# Patient Record
Sex: Female | Born: 1975 | Race: White | Hispanic: No | Smoking: Never smoker
Health system: Southern US, Academic
[De-identification: ages and names within clinical notes are randomized; demographics above are authoritative.]

## PROBLEM LIST (undated history)

## (undated) DIAGNOSIS — M25561 Pain in right knee: Secondary | ICD-10-CM

## (undated) DIAGNOSIS — G43909 Migraine, unspecified, not intractable, without status migrainosus: Secondary | ICD-10-CM

## (undated) DIAGNOSIS — G4700 Insomnia, unspecified: Secondary | ICD-10-CM

## (undated) DIAGNOSIS — K219 Gastro-esophageal reflux disease without esophagitis: Secondary | ICD-10-CM

## (undated) DIAGNOSIS — K3184 Gastroparesis: Secondary | ICD-10-CM

## (undated) DIAGNOSIS — R7303 Prediabetes: Secondary | ICD-10-CM

## (undated) DIAGNOSIS — M62830 Muscle spasm of back: Secondary | ICD-10-CM

## (undated) DIAGNOSIS — G8929 Other chronic pain: Secondary | ICD-10-CM

## (undated) DIAGNOSIS — E559 Vitamin D deficiency, unspecified: Secondary | ICD-10-CM

## (undated) DIAGNOSIS — R42 Dizziness and giddiness: Secondary | ICD-10-CM

## (undated) DIAGNOSIS — Z6835 Body mass index (BMI) 35.0-35.9, adult: Secondary | ICD-10-CM

## (undated) DIAGNOSIS — N938 Other specified abnormal uterine and vaginal bleeding: Secondary | ICD-10-CM

## (undated) DIAGNOSIS — M545 Low back pain, unspecified: Secondary | ICD-10-CM

## (undated) DIAGNOSIS — M25562 Pain in left knee: Secondary | ICD-10-CM

## (undated) DIAGNOSIS — R739 Hyperglycemia, unspecified: Secondary | ICD-10-CM

## (undated) DIAGNOSIS — Z9889 Other specified postprocedural states: Secondary | ICD-10-CM

## (undated) DIAGNOSIS — E785 Hyperlipidemia, unspecified: Secondary | ICD-10-CM

## (undated) HISTORY — DX: Body mass index (BMI) 35.0-35.9, adult: Z68.35

## (undated) HISTORY — DX: Low back pain, unspecified: M54.50

## (undated) HISTORY — DX: Vitamin D deficiency, unspecified: E55.9

## (undated) HISTORY — DX: Gastroparesis: K31.84

## (undated) HISTORY — DX: Hyperglycemia, unspecified: R73.9

## (undated) HISTORY — DX: Dizziness and giddiness: R42

## (undated) HISTORY — PX: ENDOMETRIAL ABLATION W/ NOVASURE: SUR434

## (undated) HISTORY — DX: Pain in left knee: M25.562

## (undated) HISTORY — PX: ESOPHAGOSCOPY / EGD: SUR461

## (undated) HISTORY — DX: Prediabetes: R73.03

## (undated) HISTORY — DX: Hyperlipidemia, unspecified: E78.5

## (undated) HISTORY — PX: COLONOSCOPY: WVUENDOPRO10

## (undated) HISTORY — PX: HX LAP CHOLECYSTECTOMY: SHX56

## (undated) HISTORY — DX: Insomnia, unspecified: G47.00

## (undated) HISTORY — DX: Gastro-esophageal reflux disease without esophagitis: K21.9

## (undated) HISTORY — DX: Other specified abnormal uterine and vaginal bleeding: N93.8

## (undated) HISTORY — DX: Pain in right knee: M25.561

## (undated) HISTORY — DX: Migraine, unspecified, not intractable, without status migrainosus: G43.909

## (undated) HISTORY — PX: HX BACK SURGERY: SHX140

## (undated) HISTORY — DX: Muscle spasm of back: M62.830

## (undated) HISTORY — DX: Other specified postprocedural states: Z98.890

## (undated) HISTORY — DX: Other chronic pain: G89.29

## (undated) HISTORY — PX: HX APPENDECTOMY: SHX54

## (undated) NOTE — Unmapped External Note (Signed)
Formatting of this note might be different from the original.  Images from the original note were not included.      Respiratory Protocol                 Ventilator Management             Electronically signed by Ledford, Logan L, CRT at 08/13/2022  1:28 PM EDT

## (undated) NOTE — Consults (Signed)
Formatting of this note might be different from the original.  RT Consult/Eval  +Pulmonary Status : No smoking  +Respiratory Pattern/Rate : Reg/RR 12-20  +Breath Sounds : Clear to auscu  +Cough : Strong/non-productive  +Surgical Status: No surgery  +Mental Status: Alert/Oriented  +Activity Level: Ambulatory  +Chest X-Ray: Clear/None Available  Total Points: 0  Patient Classification: Triage 5  Next Eval Due: x1  Home Therapy Level: none per pt    Evaluation Interventions: Lung Hyperinflation Therapy (no rt needed at this time)    Electronically signed by Ledford, Logan L, CRT at 08/13/2022  1:28 PM EDT

## (undated) NOTE — Unmapped External Note (Signed)
Formatting of this note might be different from the original.    Problem: Pain  Goal: Patient's pain/discomfort is manageable  Description: Assess and monitor patient's pain using appropriate pain scale. Collaborate with interdisciplinary team and initiate plan and interventions as ordered. Re-assess patient's pain level 30 - 60 minutes after pain management intervention.   Outcome: Progressing    Problem: Safety  Goal: Patient will be injury free during hospitalization  Description: Assess and monitor vitals signs, neurological status including level of consciousness and orientation. Assess patient's risk for falls and implement fall prevention plan of care and interventions per hospital policy.     Ensure arm band on, uncluttered walking paths in room, adequate room lighting, call light and overbed table within reach, bed in low position, wheels locked, side rails up per policy, and non-skid footwear provided.   Outcome: Progressing    Problem: Daily Care  Goal: Daily care needs are met  Description: Assess and monitor ability to perform self care and identify potential discharge needs.  Outcome: Progressing    Problem: Psychosocial Needs  Goal: Demonstrates ability to cope with hospitalization/illness  Description: Assess and monitor patients ability to cope with his/her illness.  Outcome: Progressing    Electronically signed by Booher, Christina D, RN at 08/14/2022  4:43 PM EDT

## (undated) NOTE — Nursing Note (Signed)
Formatting of this note might be different from the original.  Bed holding, sig other at bs  Electronically signed by Jones, Miranda, RN at 08/13/2022 10:19 AM EDT

## (undated) NOTE — Progress Notes (Signed)
Formatting of this note might be different from the original.  08/14/22 (CM note update) DC Orders reviewed. No needs identified. Family at bedside to transport home. Ready for dc from CM.   Electronically signed by Young, Rebecca L, CM at 08/14/2022  4:45 PM EDT

## (undated) NOTE — Discharge Summary (Signed)
Formatting of this note is different from the original.    Physician Discharge Summary     Patient ID:  Name:Lindsay Crawford  Date: 08/14/2022    MRN#: [id number] DOB: 03/27/1976   Admission Date:08/13/2022 Age/Sex:45-year old female     Discharge Date:  08/14/2022    Admitting Physician:  Benjamin S Scharfstein, MD FACS     Discharge Physician:  Scharfstein, Benjamin S,*    Admission Diagnoses:  GERD (gastroesophageal reflux disease) [K21.9]    Discharge Diagnoses:  Problem List Items Addressed This Visit    None  Visit Diagnoses       S/P Nissen fundoplication (with gastrostomy tube placement) (HCC)    -  Primary    Relevant Medications    oxyCODONE (Roxicodone) 5 MG immediate release tablet    naloxone (Narcan) 4 MG/0.1ML nasal spray         Admission Condition:  stable    Discharged Condition:  stable    Indication for Admission:  GERD (gastroesophageal reflux disease) [K21.9]    Hospital Course:  Patient underwent elective Nissen fundoplication and was subsequently admitted for post-operative monitoring and recovery. Patient tolerated the procedure well and progressed as expected. POD 1 she underwent a swallow study which was negative for leak. Her nasogastric tube was removed and she was started on a liquid diet. Later that evening she was tolerating her full liquid diet well, her pain was well controlled, she voided without difficulty, and ambulated without any issues.  Patient was seen by the surgical team and determined to be stable for discharge.    Consults:  None    Significant Diagnostic Studies:  Recent Results (from the past 7 days)    XR Abdomen 1 View    Narrative  EXAM DESCRIPTION:  XR ABDOMEN 1 VIEW KUB ON 08/13/2022    CLINICAL INFORMATION:  for NGT placement verification.    COMPARISON:  None available.    TECHNIQUE:  AP portable supine.    FINDINGS:  Esophagogastric catheter distal segment is adequately positioned inferior to left diaphragm.  Bowel gas pattern appears nonobstructive as seen.  The  majority of the pelvis is not imaged.  Osseous structures appear intact.  Cholecystectomy clips.    Impression  IMPRESSION:  ==========  Adequately positioned esophagogastric catheter segment inferior to left diaphragm.    Report dictation location: [id number]    Signed by: Eric J Fish, DO on 08/13/2022  8:50 AM    Recent Results (from the past 7 days)    FL DX Esophagram Single Contrast Water Soluble    Narrative  EXAM DESCRIPTION:  FL DX ESOPHAGRAM SINGLE CONTRAST WATER SOLUBLE ON 08/14/2022    INDICATION:  POst-op Nissen    COMPARISON:  None available.    TECHNIQUE:  Following the administration of Gastrografin orally, multiple fluoroscopic images were obtained in multiple different obliquities.  Approximately 1.48 of fluoroscopy time was utilized.  Sixty images.    With the assistance of Jerry Cohee RPA, multiple fluoroscopic imaging is obtained under direct supervision of a radiologist.    FINDINGS:  .  Limited evaluation of the esophagus was normal.  The upper esophageal mucosa was normal.  No strictures, masses, or areas of ulceration or seen.  There was some narrowing of the distal esophagus as expected associated with the recent Nissen fundoplication likely indicative of some underlying edema.  No evidence of a leak is seen.  Currently there is a nasogastric tube in place extending into the stomach.  The side hole   for the tube appears to reside near the gastroesophageal junction.    Impression  IMPRESSION:  No evidence of a leak.    Report dictation location: DESKTOP-[id number]    Signed by: Roderick F Biosca, MD on 08/14/2022  1:09 PM    Treatments:  Procedure(s):  DAVINCI ASSISTED LAPAROSCOPIC NISSEN FUNDOPLICATION    Orders Placed This Encounter   Medications    ceFAZolin (Ancef) 2 g in sodium chloride 100 mL IVPB-MBP    Biotin 10000 MCG Tablet Dispersible     Sig: Dissolve on tongue daily.    DISCONTD: sodium chloride (NS) 0.9 % flush 3 mL    DISCONTD: sodium chloride (NS) 0.9 % flush 3 mL    DISCONTD:  lactated ringers infusion    DISCONTD: midazolam (Versed) injection 1 mg    DISCONTD: fentaNYL (Sublimaze) injection 50 mcg    aprepitant (Emend) capsule 40 mg    chlorhexidine (Peridex) 0.12 % solution 15 mL    DISCONTD: insulin lispro (HumaLOG,AdmeLOG) injection 0-5 Units    DISCONTD: lactated ringers infusion    DISCONTD: atropine injection 0.4 mg     Order Specific Question:   For Heart Rate:     Answer:   < 40    DISCONTD: diphenhydrAMINE (Benadryl) injection 6.25 mg    DISCONTD: haloperidol lactate (Haldol) injection 1 mg    DISCONTD: HYDROmorphone (Dilaudid) injection 0.2 mg    DISCONTD: HYDROmorphone (Dilaudid) injection 0.5 mg    DISCONTD: naloxone (Narcan) injection 0.1 mg    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    diatrizoate meglumine-sodium (MD-Gastroview) 66-10 % solution 120 mL    oxyCODONE (Roxicodone) 5 MG immediate release tablet     Sig: Take 1 tablet (5 mg total) by mouth every 4 (four) hours as needed for pain for up to 3 days. Max Daily Amount: 30 mg     Dispense:  12 tablet     Refill:  0    naloxone (Narcan) 4 MG/0.1ML nasal spray     Sig: Instill 1 spray into alternating nostrils every 2 to 3 min as needed for respiratory depression. Follow package instructions for emergencies     Dispense:  2 each     Refill:  0     Disposition:  Home or Self Care    Patient Instructions:     Medication List       START taking these  medications      naloxone 4 MG/0.1ML nasal spray  Commonly known as: Narcan  Instill 1 spray into alternating nostrils every 2 to 3 min as needed for respiratory depression. Follow package instructions for emergencies    oxyCODONE 5 MG immediate release tablet  Commonly known as: Roxicodone  Take 1 tablet (5 mg total) by mouth every 4 (four) hours as needed for   pain for up to 3 days. Max Daily Amount: 30 mg          CONTINUE taking these medications      bethanechol 5 MG tablet  Commonly known as: Urecholine    Biotin 10000 MCG Tbdp    cimetidine 400 MG tablet  Commonly known as: Tagamet    cyclobenzaprine 10 MG tablet  Commonly known as: Flexeril    ferrous sulfate 325 (65 FE) MG tablet  Commonly known as: FeroSul    HYDROcodone-acetaminophen 5-325 MG per tablet  Commonly known as: Norco    LAXATIVE POLYETHYLENE GLYCOL PO    Linzess 72 MCG capsule  Generic drug: linaclotide    metoclopramide 5 MG tablet  Commonly known as: Reglan    omeprazole 20 MG capsule  Commonly known as: PriLOSEC    rosuvastatin 5 MG tablet  Commonly known as: Crestor    topiramate 100 MG tablet  Commonly known as: Topamax    traZODone 300 MG tablet  Commonly known as: Desyrel    vitamin B-12 1000 MCG tablet  Commonly known as: Cyanocobalamin    Vitamin C 500 MG Caps    Vitamin D (Ergocalciferol) 50000 units Caps    ZYRTEC ALLERGY PO            Where to Get Your Medications       These medications were sent to [id number] - [place], [place] - [address]  [address], [place] [address]      Phone: [phone]   naloxone 4 MG/0.1ML nasal spray  oxyCODONE 5 MG immediate release tablet      Discharge Procedure Orders   NALOXONE SPECIAL INSTRUCTIONS   Order Comments: NARCAN (naloxone hydrochloride) Nasal Spray is an opioid antagonist indicated for the emergency treatment of known or suspected opioid overdose, as manifested by respiratory and/or central nervous system depression. NARCAN Nasal Spray is intended for immediate  administration as emergency therapy in settings where opioids may be present.    Narcan Nasal spray is 4mg/dose intranasally.  Pharmacies may substitute a naloxone SQ (0.4mg/mL)  kit for patients who cannot afford naloxone nasal spray.  Patients are instructed to call 911 immediately if this medication is given.    Discussed the risks, benefits and alternatives to Narcan Nasal Spray.  Risks include cardiovascular events, precipitation of withdrawal, incomplete reversal, hypotension, ranging from mild and including, rarely, death.     Discharge Activity     Order Specific Question Answer Comments   Discharge Activity Discharge Activity as Tolerated    Driving Restrictions Patient May Not Drive Until: No longer requiring narcotics for pain control     Discharge Diet     Order Specific Question Answer Comments   Diet Other    Other: Full liquid diet      Special Instructions   Order Comments: Special Discharge Instructions:    Maintain full liquid diet for 2 weeks post-operatively until seen in clinic and cleared by your operative surgeon for solid foods. Avoid taking more than 2 tablets or pills at a time. Incisional wounds skin is closed with absorbable suture and will dissolve. Glue overtop will dissolve with time, do not scrub.     Signed:  Katherine D Kazen, MD, 08/14/2022 4:09 PM    Electronically signed by Scharfstein, Benjamin S, MD FACS at 08/15/2022  1:19 PM EDT

## (undated) NOTE — Unmapped External Note (Signed)
Formatting of this note might be different from the original.      [facility]                                                                                             Ludden, Lindsay Crawford                                                                                                                                     F   45 Y   [id number]  [address]                                                                                                           11/26/1976  [id number]  [place] [address]                                                                                                             BENJAMIN S SCHARFSTEIN                                                                                                                                       LOCATION:  [facility] PACU  PACU  [facility] PACU  OPERATIVE REPORT                                                                                                                   PATIENT TYPE:    PATIENT NAME:  [name], Lindsay Crawford    DATE OF OPERATION:  08/13/2022    SURGEON:  Ben S Scharfstein Jr, MD    REFERRING PHYSICIAN:  BENJAMIN S SCHARFSTEIN    PREOPERATIVE DIAGNOSIS:  Acid reflux refractory to medical management.    POSTOPERATIVE DIAGNOSIS:  Acid reflux refractory to medical management.    PROCEDURE:  Da Vinci-assisted laparoscopic Nissen fundoplication.    ANESTHESIA:  General endotracheal anesthesia.    ESTIMATED BLOOD LOSS:  Minimum.    INDICATIONS FOR PROCEDURE:  The patient is a 45-year-old female who had acid reflux which  was refractory to medical management.  She was referred for surgical management.  I spoke  with her the risks, benefits, and alternatives, and she opted to proceed.    PROCEDURE NOTE IN DETAIL:  The patient was taken to the OR suite.  After adequate  anesthesia was provided, her abdomen was prepped and draped in usual standard sterile  fashion.  Foley catheter was placed under sterile  condition as well.  Using the open  Hasson technique, a bladeless 8 mm trocar was placed above the umbilicus.  Pneumoperitoneum was provided and laparoscope was inserted.  Two bladeless trocars were  placed in the right upper quadrant, one 5 mm for the liver retractor and another 8 mm for  the da Vinci.  I then placed 3 bladeless trocars in the left upper quadrant, two 8 mm for  the da Vinci and one as an assist site.  The patient was placed in reverse Trendelenburg  position.  The liver retractor was carefully placed and elevated the left lobe of the  liver.  I began by taking down the gastrohepatic ligament.  I identified the right crus,  carefully freed up the GE junction from the left and right crus.  I divided the short  gastrics with Harmonic scalpel.  I then carefully gained access posteriorly to grasp the  fundus of the stomach, bringing it around posteriorly to create the wrap.  The wrap was  created with 3 interrupted 0 Ethibond stitches at the GE junction using 0 Ethibond for a  distance of about 2.5 to 3 cm.  The NG tube was left in place.  The liver retractor was  carefully removed, returned the liver to its anatomic position.  Pneumoperitoneum was  evacuated and the trocars were removed.  The fascia was reapproximated with 0 Vicryl.  The  skin was reapproximated with 4-0 Monocryl.  The sponge, needle, and instrument counts were  correct at the end of the case.  The patient tolerated the procedure well.  She was taken  to recovery room in stable condition.                                                 Ben S Scharfstein Jr, MD  Report is considered PRELIMINARY until authenticated.    BSS/AQuity  DD:  08/13/2022 08:24  DT:  08/13/2022 08:49  Document #:  [id number]    [name], Lindsay Crawford  OPERATIVE REPORT  [id number]  Electronically signed by Scharfstein, Benjamin S, MD FACS at 08/13/2022 11:28 AM EDT

## (undated) NOTE — Progress Notes (Signed)
Formatting of this note might be different from the original.  Images from the original note were not included.    Daily Progress Note    Subjective:  Patient is doing well postoperatively.  She complains of some abdominal soreness.  Denies any nausea or vomiting.  NG tube is in place and she would like this removed.  Has been passing gas in her sleep but denies bowel movement since admission.  Has been up and out of bed.    Objective:  Last Vitals  Temp: 97.8 F (36.6 C), Pulse: 84, BP: 129/63, Resp: 16, SpO2: 97 %    Vital Signs (Last 24)  Temp  Avg: 98.3 F (36.8 C)  Min: 97.8 F (36.6 C)  Max: 98.9 F (37.2 C)  Pulse  Avg: 81.8  Min: 70  Max: 88  BP  Min: 122/63  Max: 147/73  Resp  Avg: 15  Min: 11  Max: 20  SpO2  Avg: 96.7 %  Min: 94 %  Max: 100 %    Inputs / Outputs (Last 24 Hours):  09/15 0645 - 09/16 0644  In: 800 [I.V.:800]  Out: 320 [Urine:300]    Inputs / Outputs (Last Shift):  No intake/output data recorded.    General:  No acute distress.  NG tube in place  CV:  Regular rate and rhythm  Pulm:  Normal work of breathing on 2 L nasal cannula  Abdomen:  Soft with expected postoperative distention and soreness.  Minimal TTP to epigastric region. Incisions clean dry and intact.  No rebound or guarding  Extremities:  Warm and dry    Labs:  CBC:   No results for input(s): WBC, HGB, HCT, PLT in the last 72 hours.    CMP:  No results for input(s): NA, K, CHLORIDE, CO2, BUN, CREATININE, GLU, CALCIUM, CAION, MG, PHOS in the last 72 hours.    No results for input(s): BILITOT, ALKPHOS, AST, ALT, PROT, ALBUMIN, AMYLASE, LIPASE, LACTATE in the last 72 hours.    Coags:  No results for input(s): PROTIME, INR, PTT in the last 72 hours.    Imaging:  N/a    Assessment/Plan:  45-year old female hospital day#1, 1 Day Post-Op s/p DaVinci assisted laparoscopic Nissen fundoplication.  Patient is doing well postop.  Pain is well-controlled.  We will discuss advancing diet, swallow eval and NG tube removal with  attending.    Principal Problem:    GERD (gastroesophageal reflux disease)    --------------------------------------------------------  Will likely advance to clears today  Possible NG tube removal  Daily labs  Pain control  As needed antiemetics  Encourage out of bed  Follow up upper GI    Will discuss with attending surgeon with [facility].    Disposition:  Floor status    Electronically signed and authored by: Laura K Mann, DO - PGY 1, 08/14/2022, 5:56 AM.   Electronically signed by Scharfstein, Benjamin S, MD FACS at 08/14/2022  7:21 AM EDT

## (undated) NOTE — Progress Notes (Signed)
Formatting of this note is different from the original.  (CM note update)  DCP discussed w/pt to return home.  Independent of adls.  Denies HH/PT/DME services.  PCP Ladonna Bowling, MD at [facility].  SO to transport home.    08/13/22 1436   Outpatient/Observation High Risk Assessment   Outpatient/Observation Patient  No   Referral Data   Referral Source Case Management   Referral Reason Discharge planning   Patient Information   Obtained from Patient   Primary Caregiver Self   Support System Immediate family   Food Insecurity   Within the past 12 months, you worried that your food would run out before you got the money to buy more. Sometimes   Within the past 12 months, the food you bought just didn't last and you didn't have money to get more. Sometimes   HRSN - Transportation   In the past 12 months, has lack of reliable transportation kept you from medical appointments, meetings, work or from getting to things needed for daily living? No   HRSN - Living Situation   What is your living situation today? I have a ste   Think about the place you live. Do you have problems with any of the following? Water leaks   HRSN - Utilities   In the past 12 months has the electric, gas, oil, or water company threatened to shut off services in your home? Yes  (Water)   HRSN - Safety   How often does anyone, including family and friends, physically hurt you? Never   How often does anyone, including family and friends, insult or talk down to you? Never   How often does anyone, including family and friends, threaten you with harm? Never   How often does anyone, including family and friends, scream or curse at you? Never   HRSN - Safety Scoring 4   Activities of Daily Living   Functional Status Independent   Assistive Device N/A   Living Arrangement Lives with someone;Private home   Dressing, Feeding, & Bathing Independent   Disability N/A   Behavior Oriented   Communication Talks;Understands English   Referral To    Financial Resources N/A   Community Resources No   Other    CM Communication 08/13/22 Home; SO to transport home.       Electronically signed by Rose, Tangie, RN at 08/13/2022  2:40 PM EDT

---
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
## 2017-09-02 IMAGING — US ABD LIMITED
1 series · 14 of 25 positions shown · non-contrast
Comparison: Ultrasound abdomen dated 07/20/2016.

Exam:  MD ERSHADUL PROFESSIONAL READ ABD U/S LMTD
INDICATION: Fatty liver.

[Series 1: abd limited · 14 of 50 slices shown]
[im 1/50]
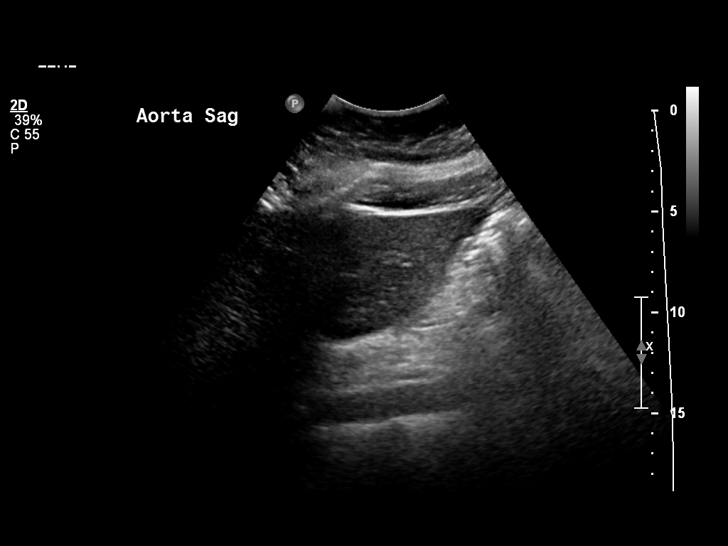
[im 5/50]
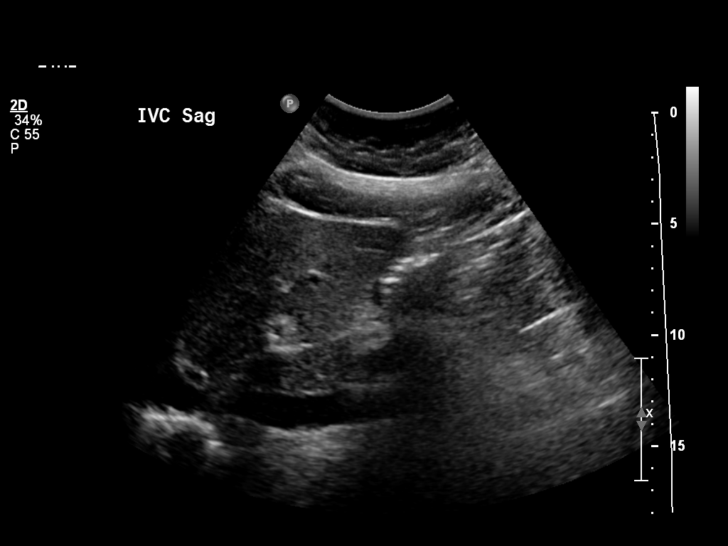
[im 9/50]
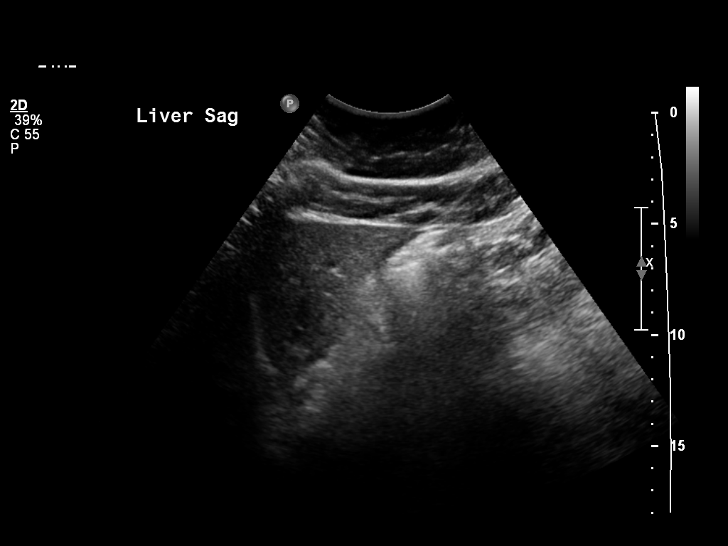
[im 13/50]
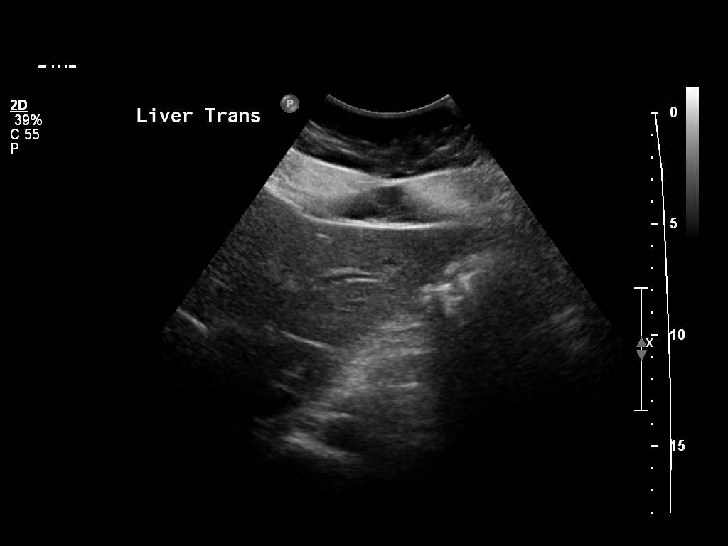
[im 17/50]
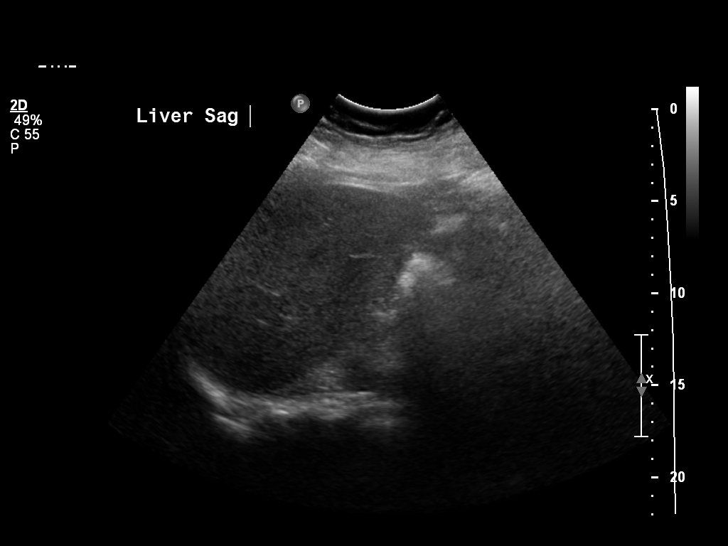
[im 19/50]
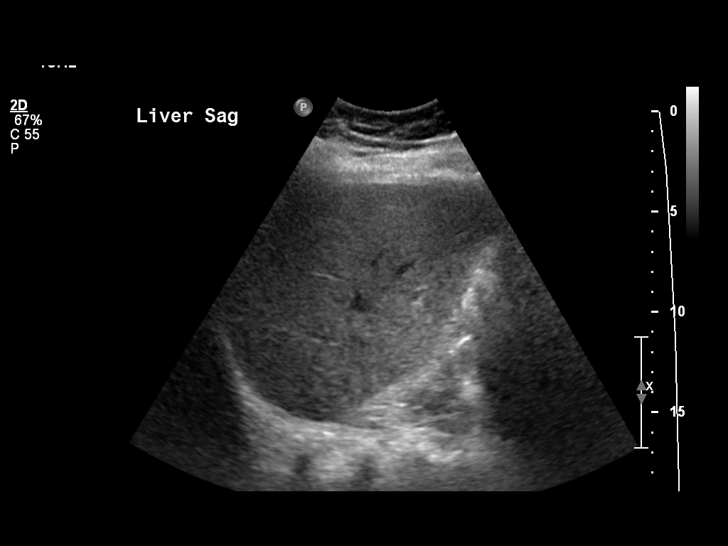
[im 23/50]
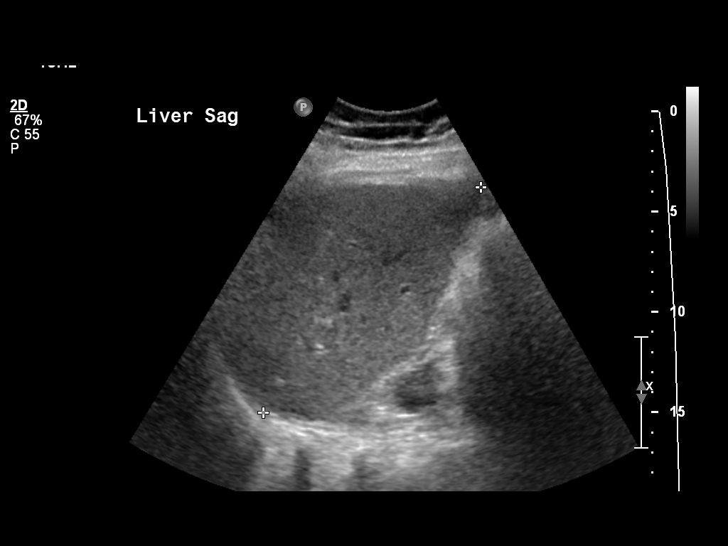
[im 27/50]
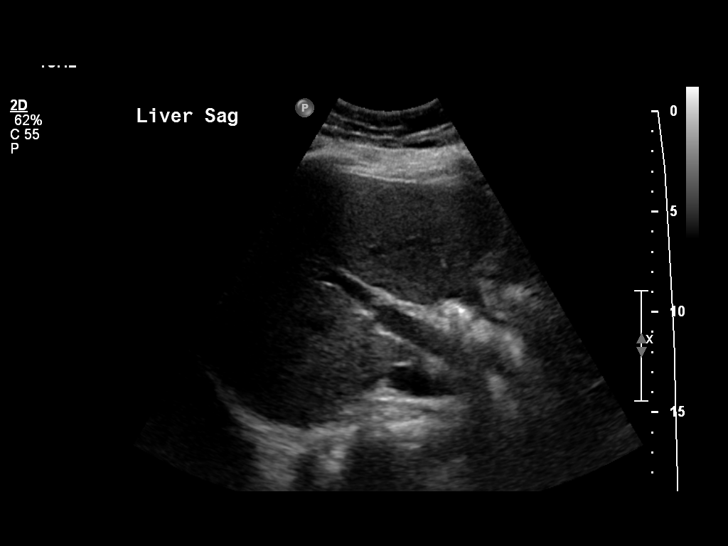
[im 31/50]
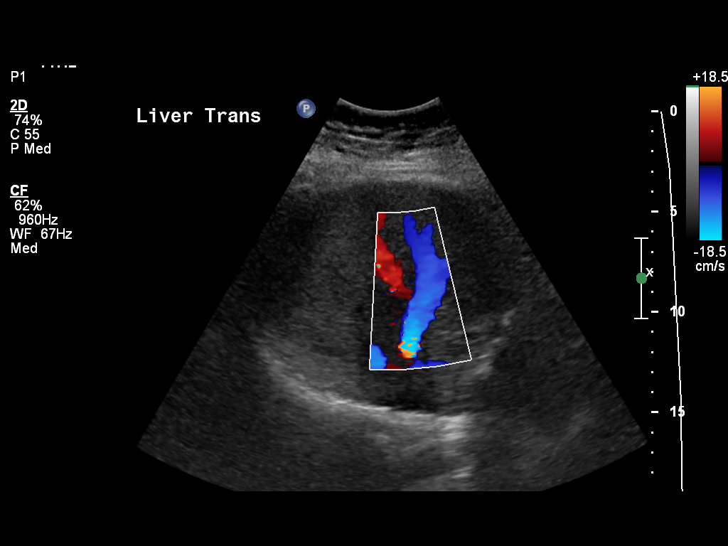
[im 33/50]
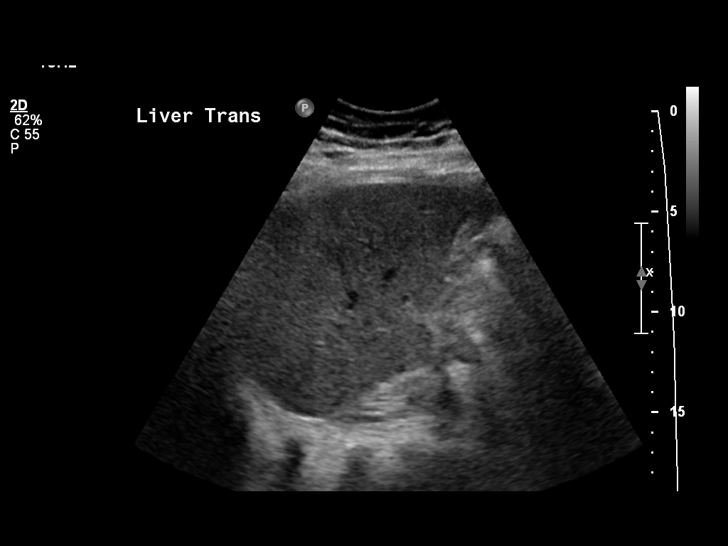
[im 37/50]
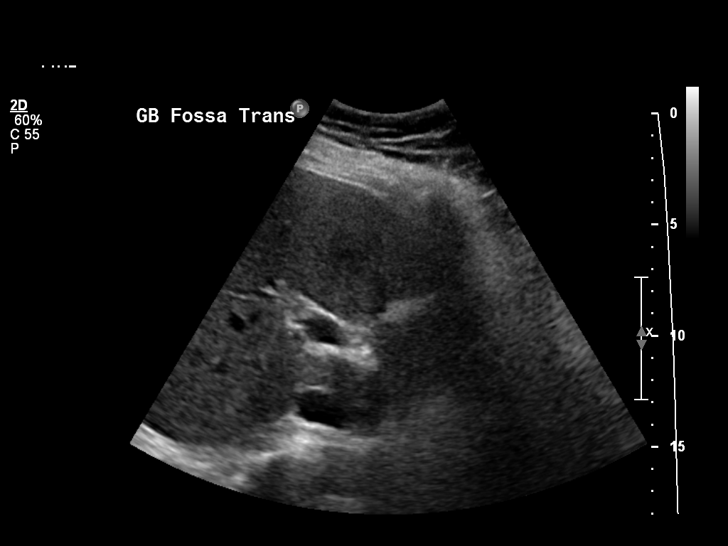
[im 41/50]
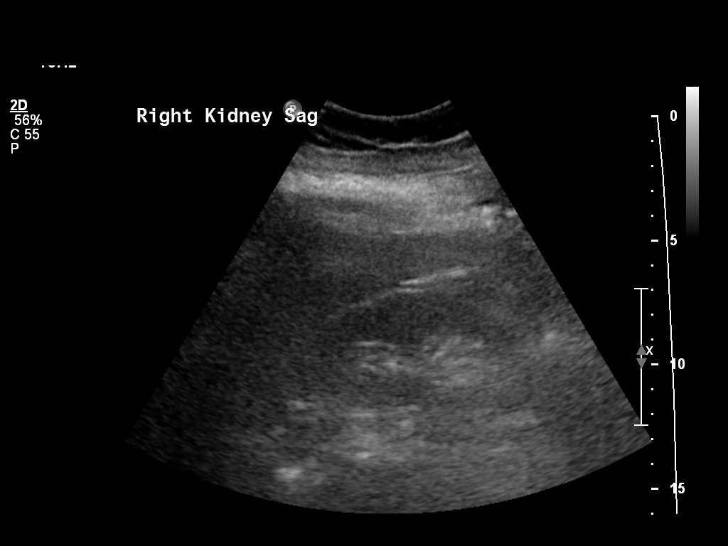
[im 45/50]
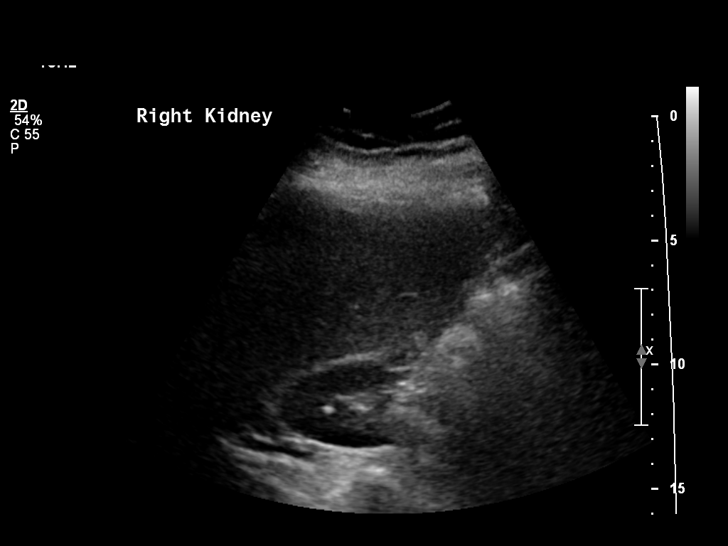
[im 50/50]
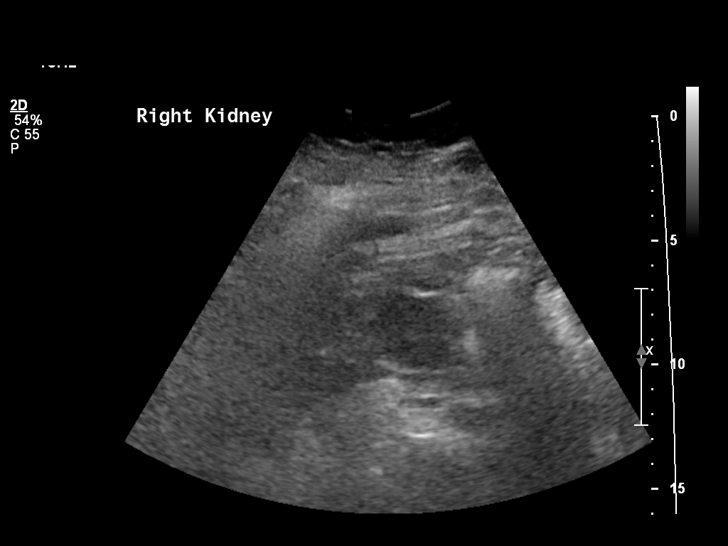

[14 of 25 positions shown; findings below may reference images not displayed]

FINDINGS: Images were loaded on the PACS system on 09/08/2017 at [DATE]. 

Liver is slightly heterogeneous in echogenicity. There is no definite hepatic mass. There is no intra or extra hepatic biliary ductal dilatation. Common bile duct measures 2.5 mm. Gallbladder is surgically absent. Pancreas is incompletely visualized due to artifact from overlying bowel gas. Right kidney measures 11 cm and is normal. 

Visualized abdominal aorta is without aneurysmal dilatation. IVC is normal. Portal vein measures 10 mm in diameter and demonstrates hepatopetal flow. Hepatic veins are also patent. There is no ascites.
IMPRESSION: Slightly heterogeneous echogenicity of the liver. 

Prior cholecystectomy. 

Pancreas incompletely visualized due to artifact from overlying bowel gas.

## 2018-10-17 DIAGNOSIS — M5126 Other intervertebral disc displacement, lumbar region: Secondary | ICD-10-CM | POA: Insufficient documentation

## 2021-01-21 IMAGING — CR XRAY KNEE 3 VIEWS LT
1 series · 3 of 3 positions shown · non-contrast
Comparison: Right knee radiographs dated 01/18/2017.

﻿EXAM:  XRAY KNEE 3 VIEWS LT,

EXAM  XRAY KNEE 3 VIEWS RT
INDICATION: Bilateral knee pain.

[Series 1: view not recorded · 0.17mm/px · 3 of 3 slices shown]
[im 1/3]
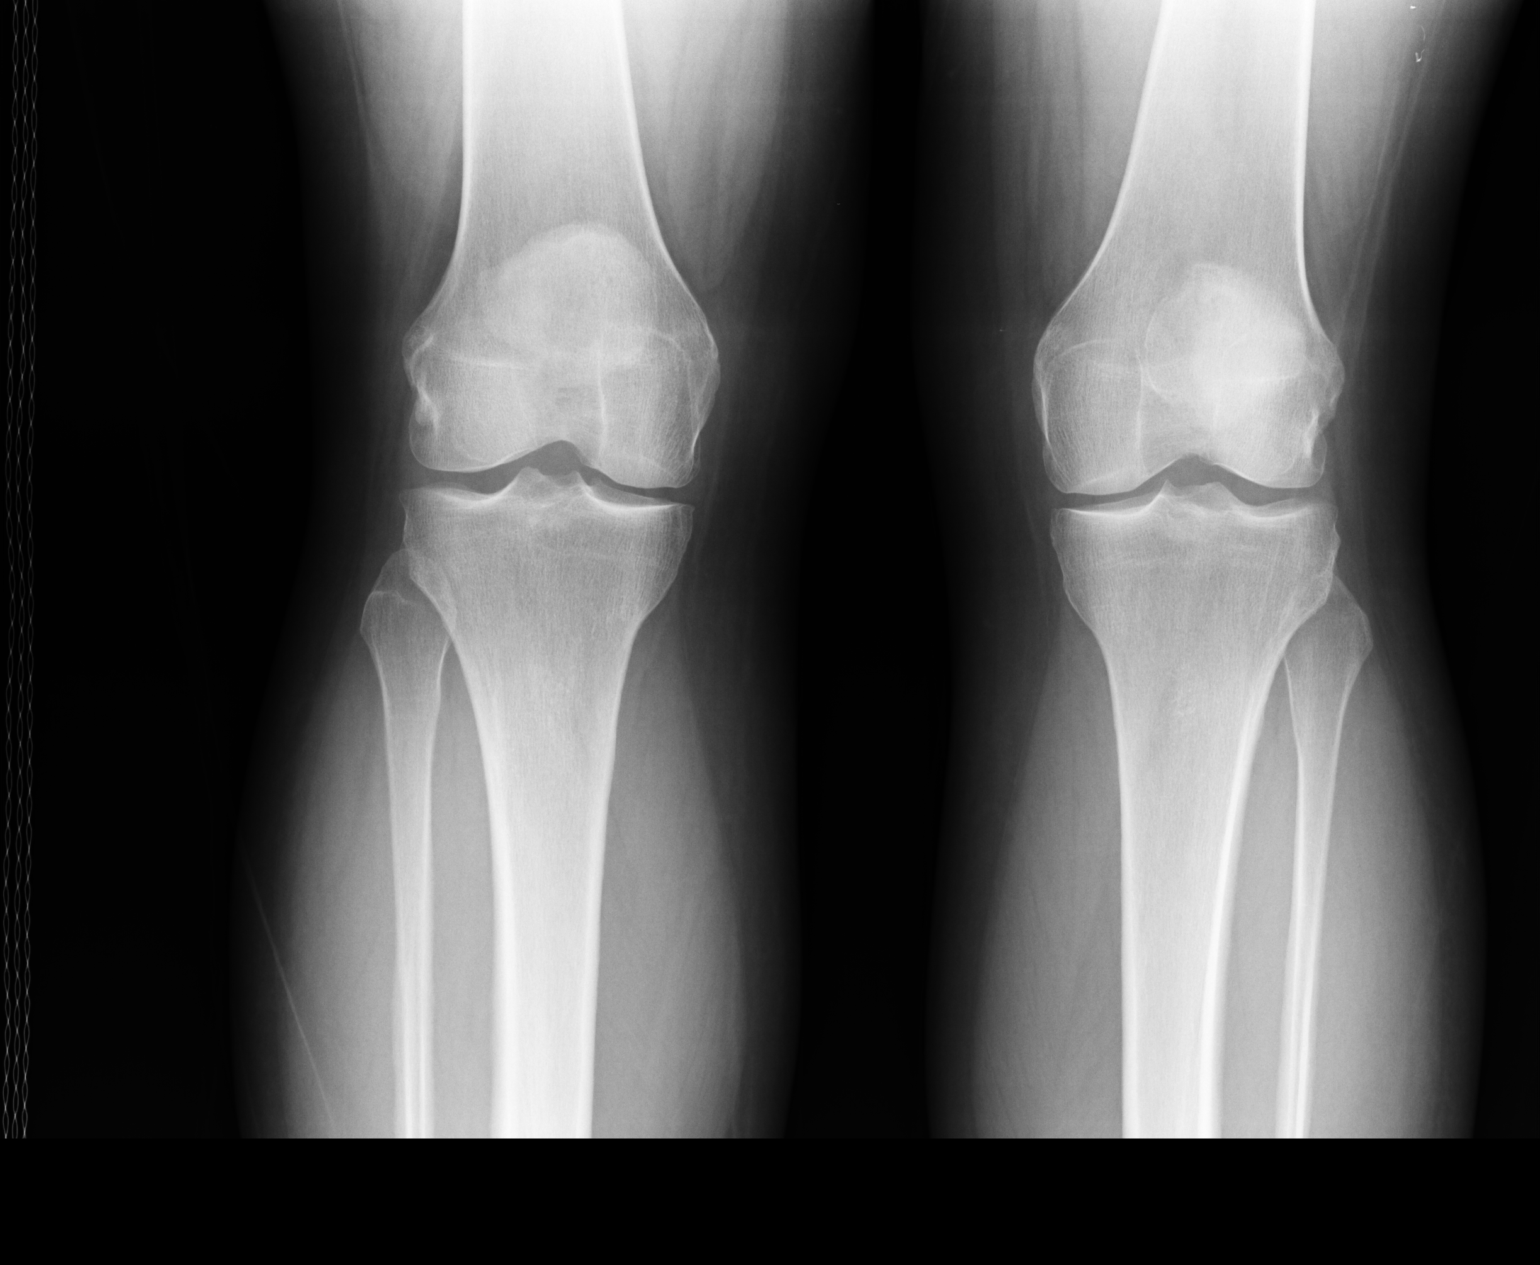
[im 2/3]
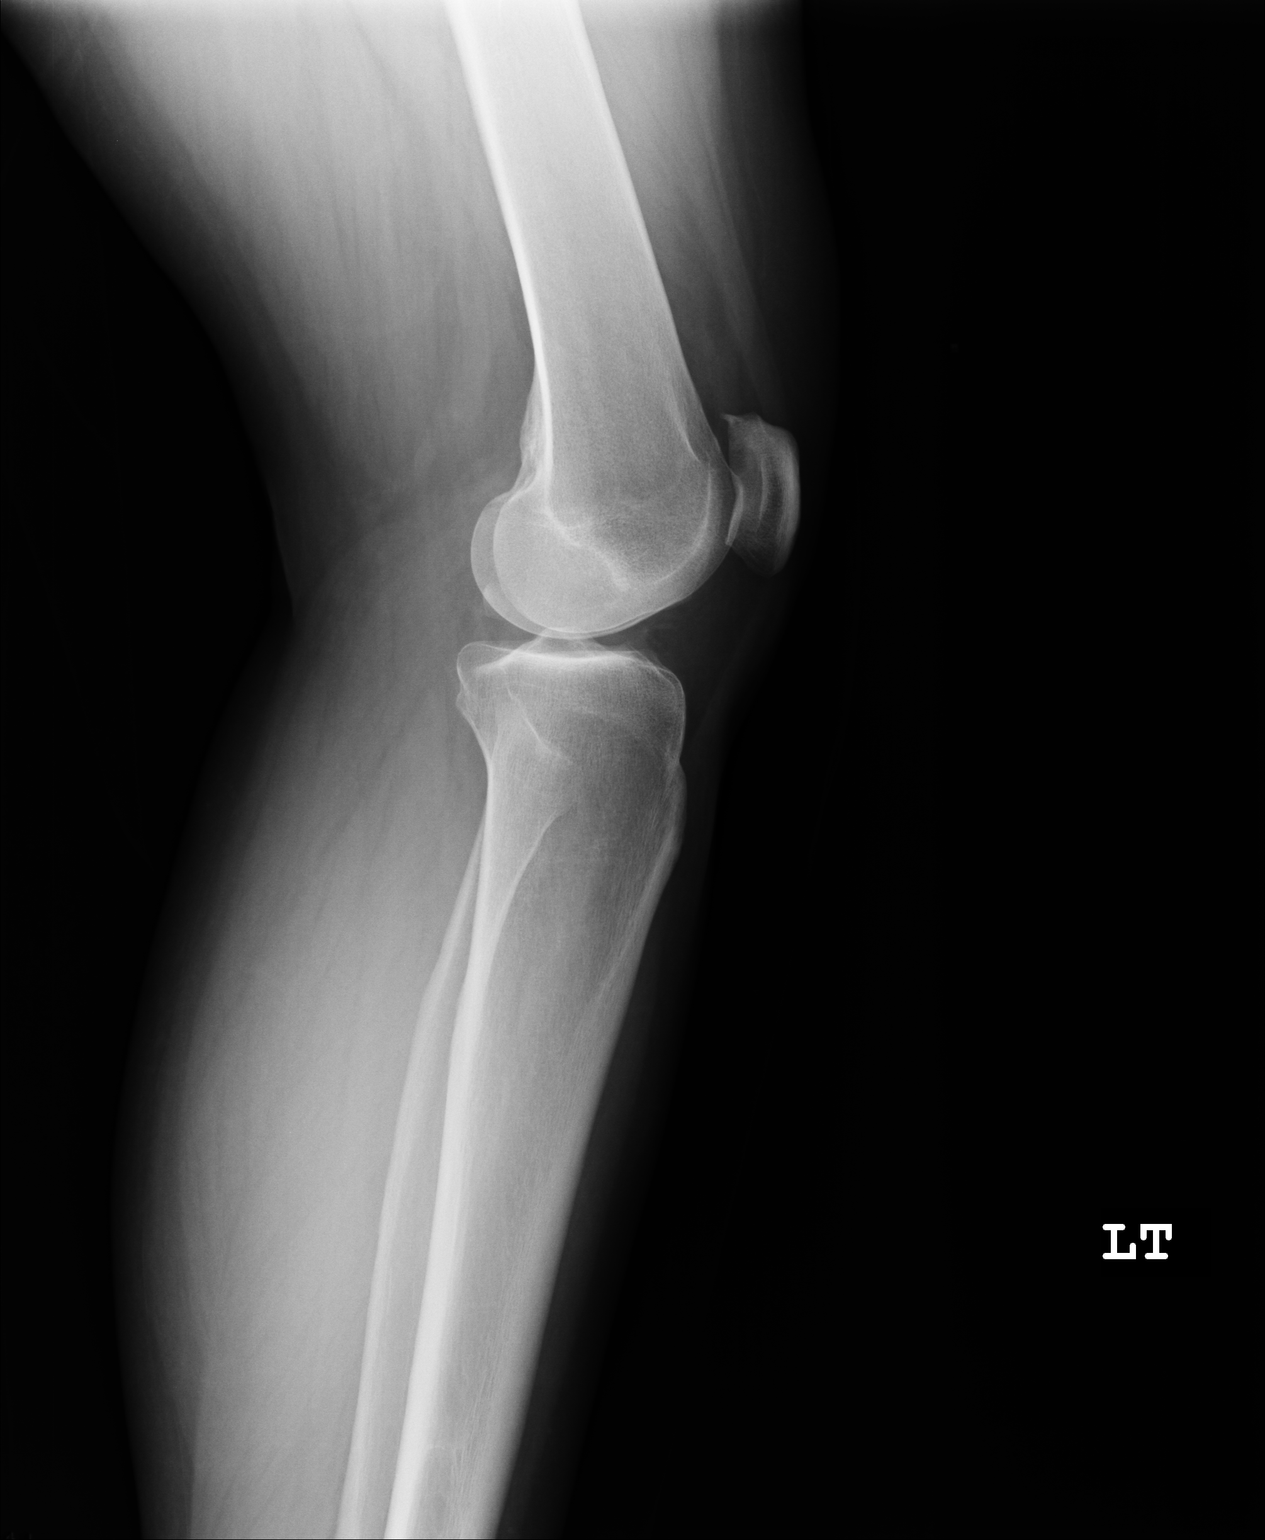
[im 3/3]
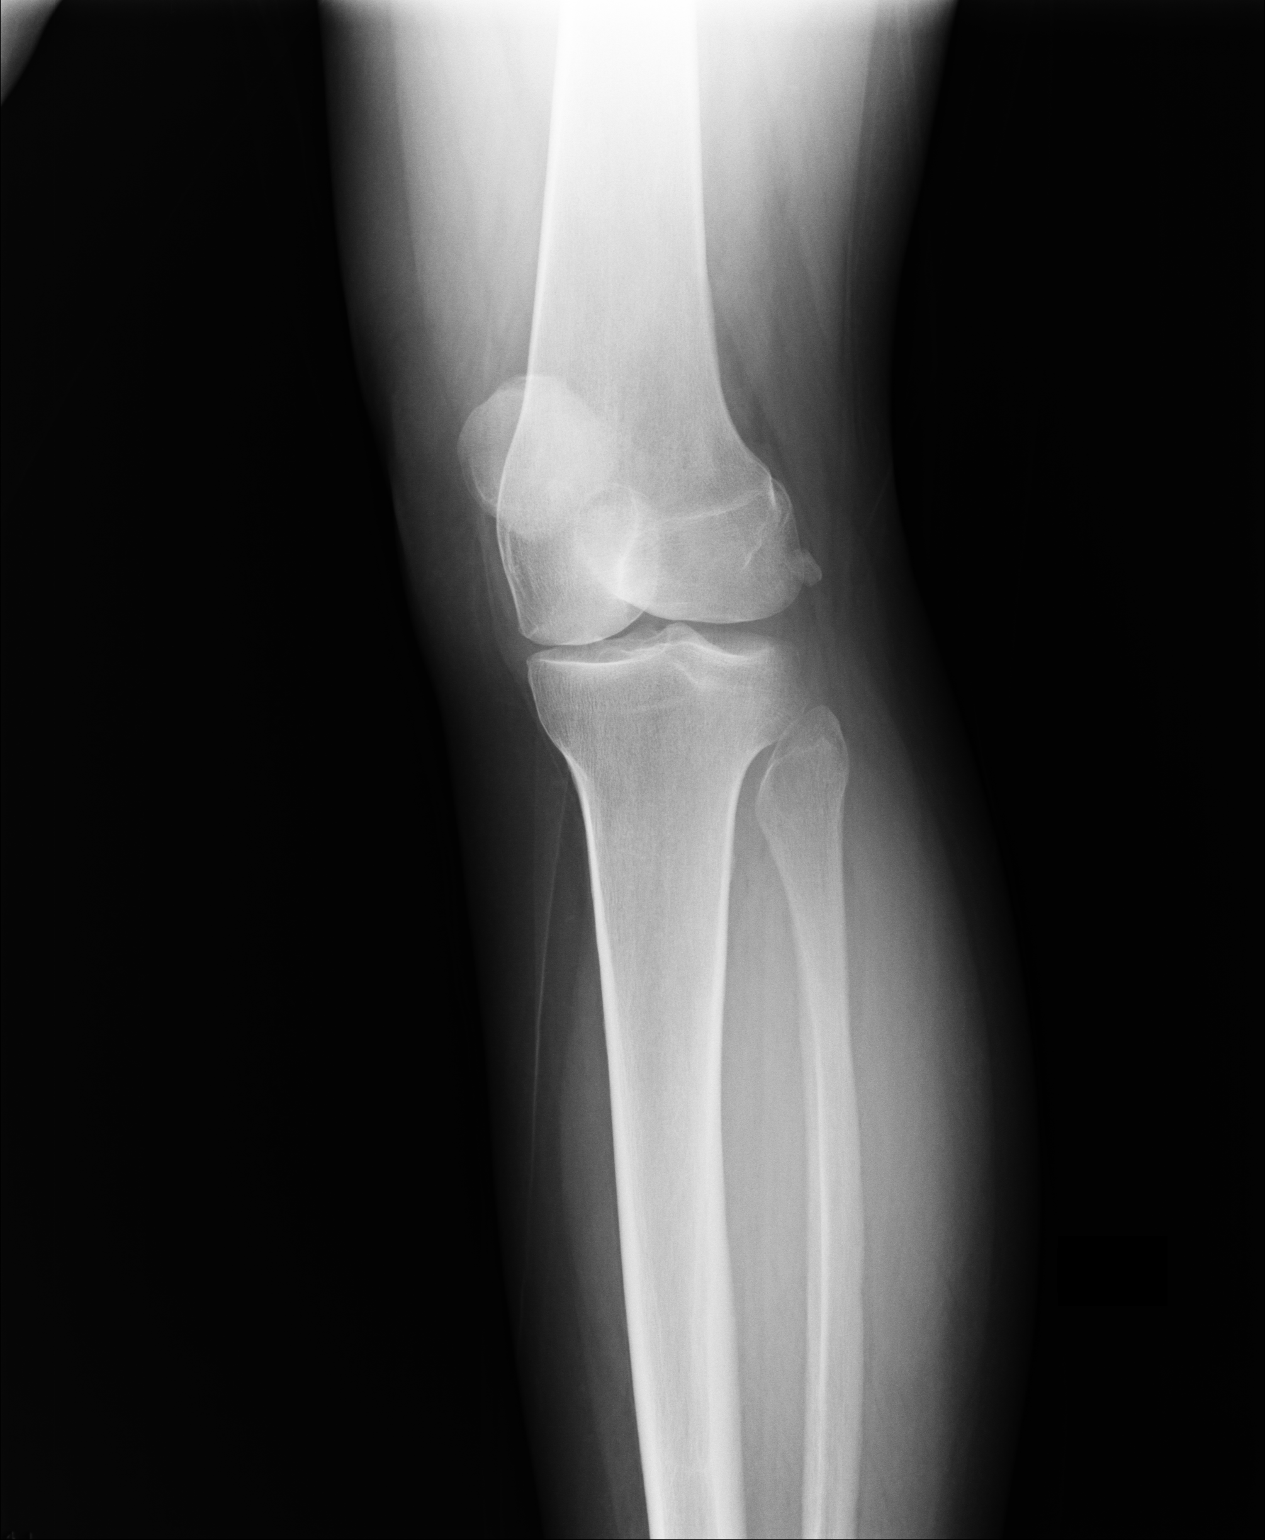

[3 of 3 positions shown; findings below may reference images not displayed]

FINDINGS: There is no acute fracture or subluxation. No significant arthritic changes are seen. There is no suprapatellar effusion on either side. There is no soft tissue abnormality.
IMPRESSION: Unremarkable exams.

## 2021-01-21 IMAGING — CR XRAY KNEE 3 VIEWS RT
1 series · 2 of 2 positions shown · non-contrast
Comparison: Right knee radiographs dated 01/18/2017.

﻿EXAM:  XRAY KNEE 3 VIEWS LT,

EXAM  XRAY KNEE 3 VIEWS RT
INDICATION: Bilateral knee pain.

[Series 1: view not recorded · 0.17mm/px · 2 of 2 slices shown]
[im 1/2]
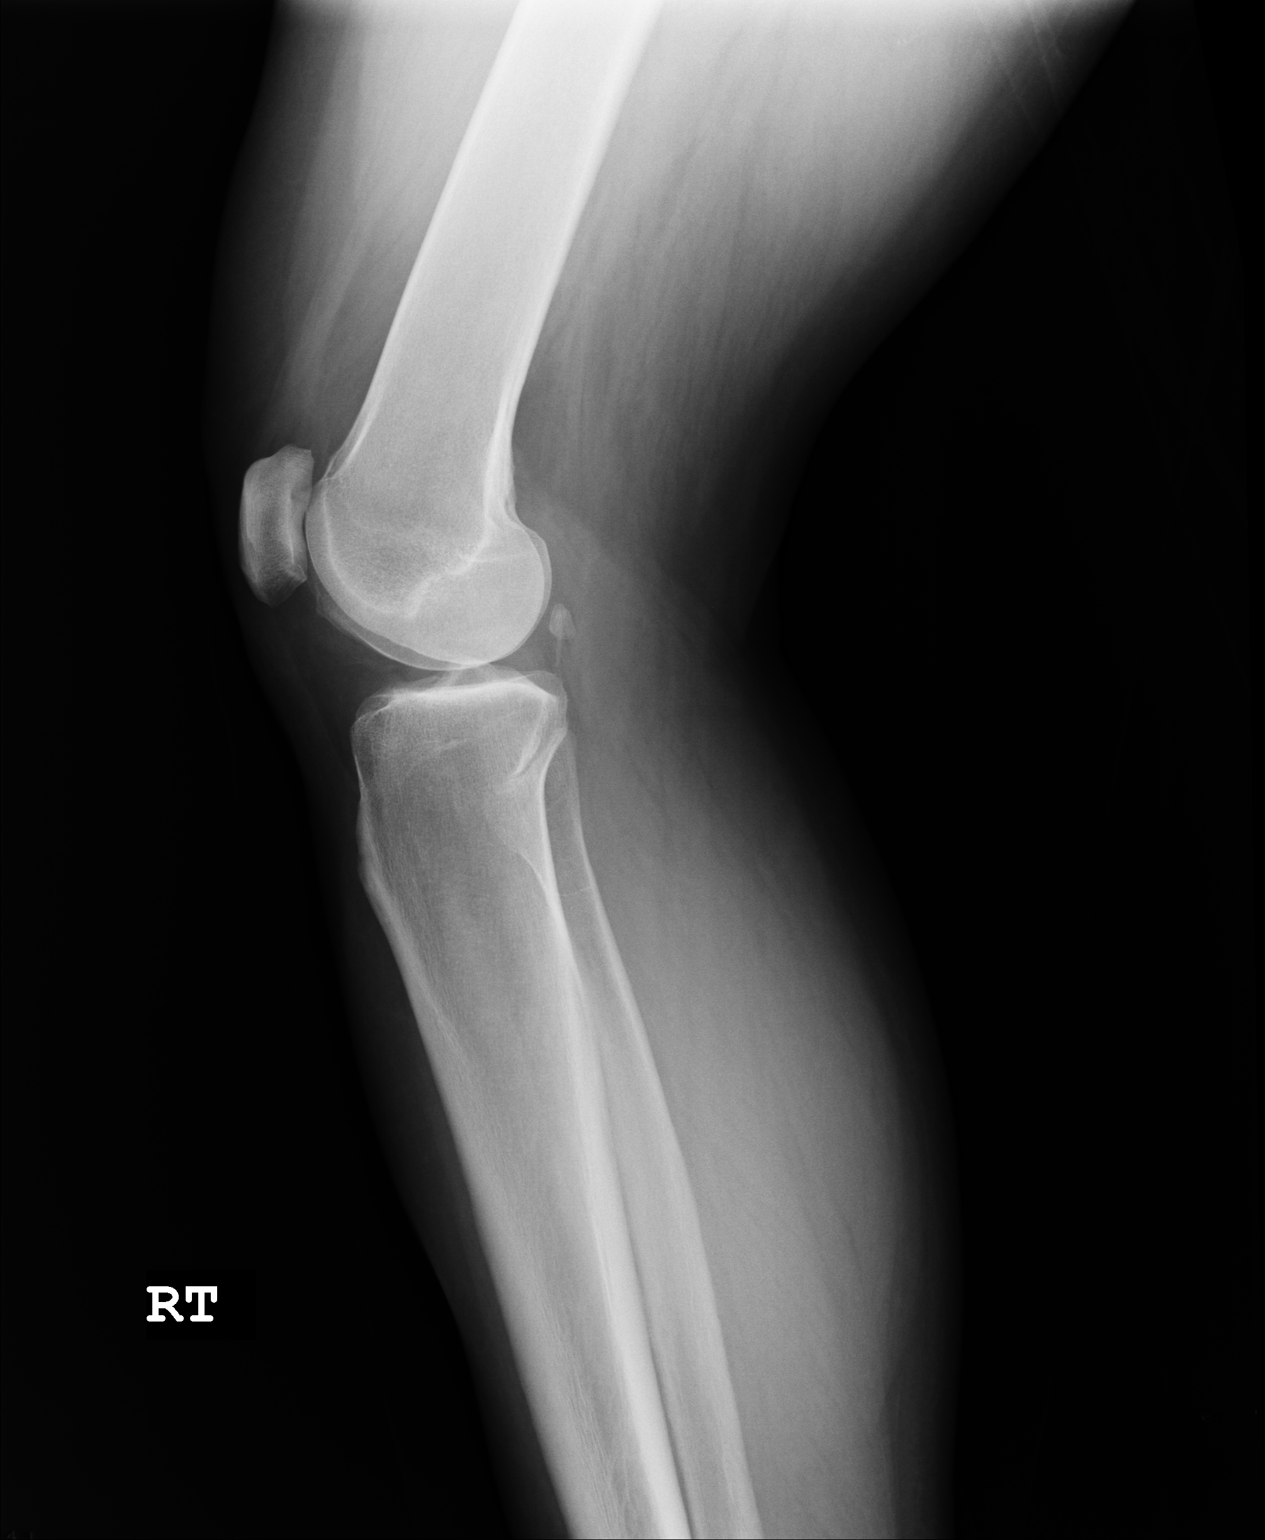
[im 2/2]
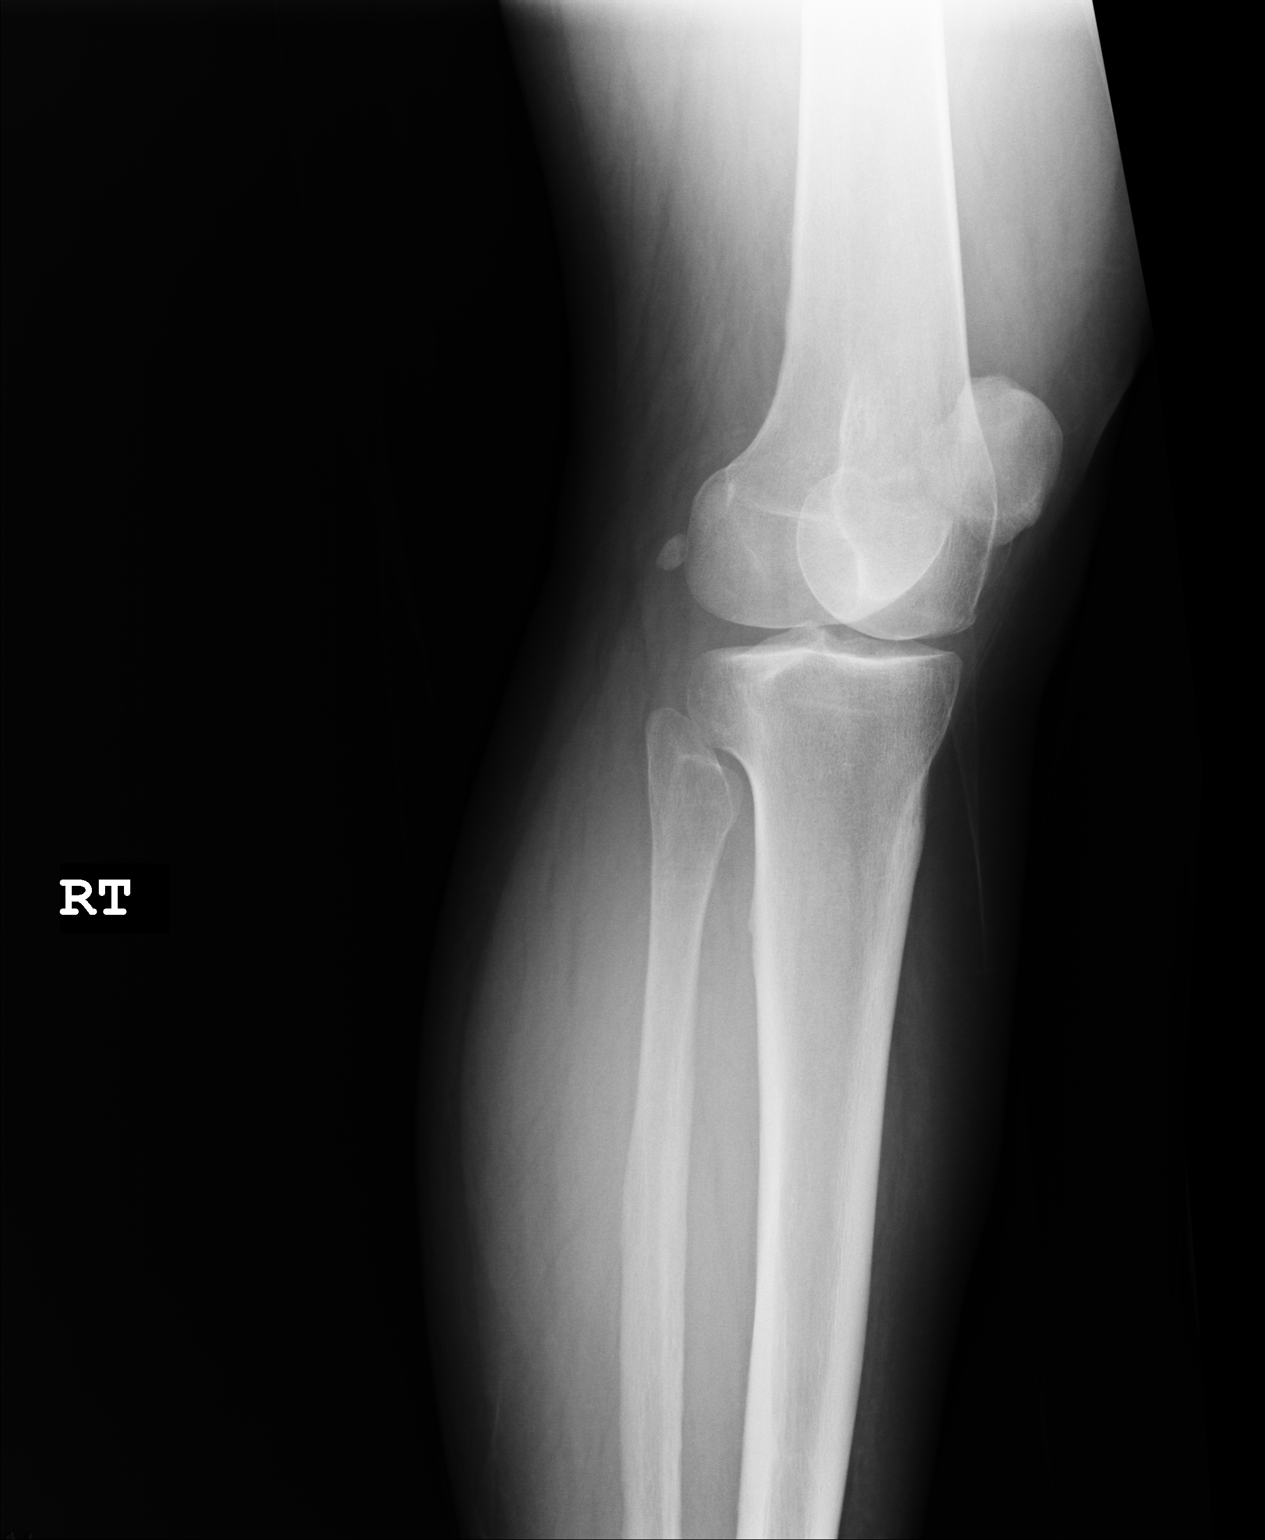

[2 of 2 positions shown; findings below may reference images not displayed]

FINDINGS: There is no acute fracture or subluxation. No significant arthritic changes are seen. There is no suprapatellar effusion on either side. There is no soft tissue abnormality.
IMPRESSION: Unremarkable exams.

## 2021-05-29 IMAGING — MR MRI KNEE RT W/O CONTRAST
6 series · 40 of 40 positions shown · IV contrast (gadolinium)
Comparison: Radiographs dated 01/21/2021.

﻿EXAM:  51571   MRI KNEE RT W/O CONTRAST
INDICATION: Chronic right knee pain and instability.
TECHNIQUE: Multiplanar, multisequential MRI of the right knee joint was performed without gadolinium contrast.

[Series 4: s-map · axial · right · 7.8mm · 3.91mm/px · z∈[-165,+139]mm · 15 of 80 slices shown]
[im 1/80]
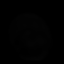
[im 6/80]
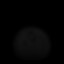
[im 12/80]
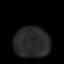
[im 17/80]
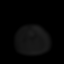
[im 23/80]
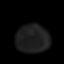
[im 29/80]
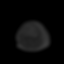
[im 34/80]
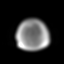
[im 40/80]
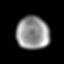
[im 46/80]
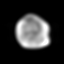
[im 51/80]
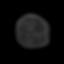
[im 57/80]
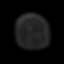
[im 63/80]
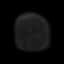
[im 68/80]
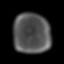
[im 74/80]
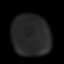
[im 80/80]
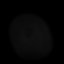

[Series 5: PD fat-sat · axial · right · 4.0mm · 0.37mm/px · z∈[-96,+34]mm · 5 of 30 slices shown (1 of 3)]
[im 1/30]
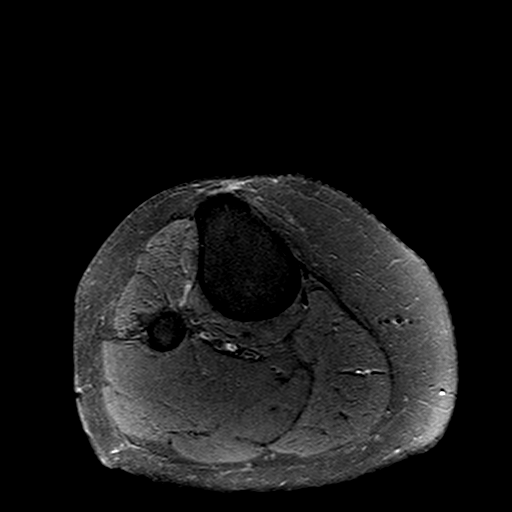
[im 8/30]
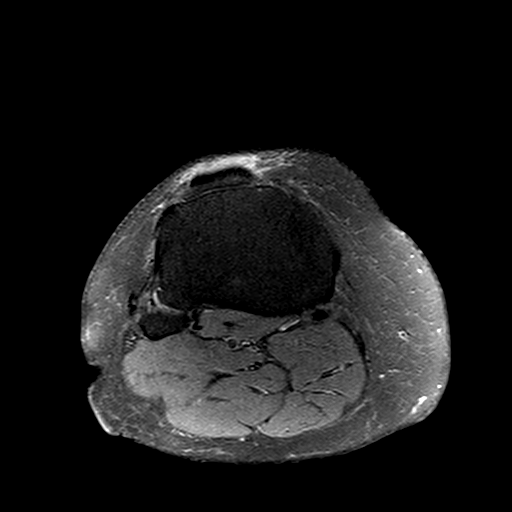
[im 15/30]
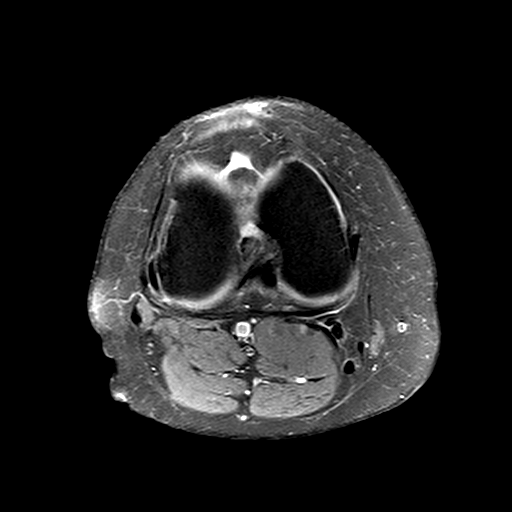
[im 22/30]
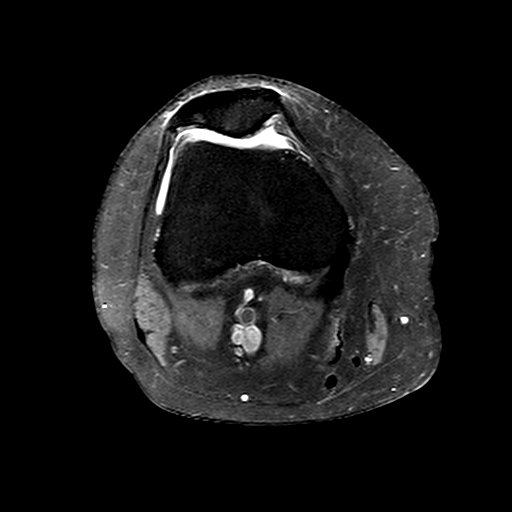
[im 30/30]
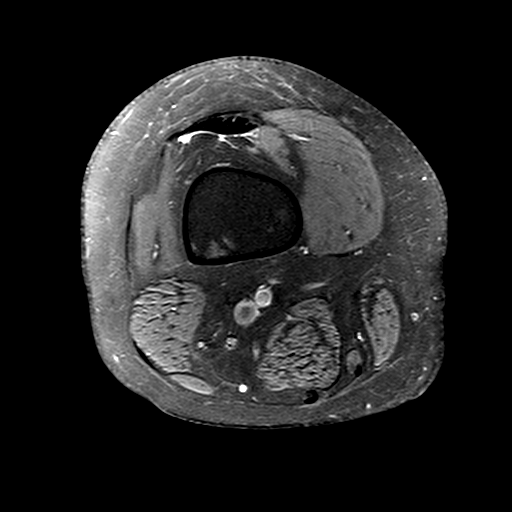

[Series 6: PD fat-sat · sagittal · right · 3.0mm · 0.29mm/px · 5 of 30 slices shown (2 of 3)]
[im 1/30]
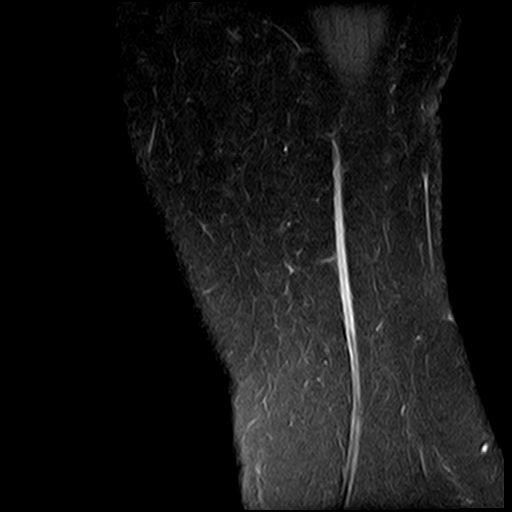
[im 8/30]
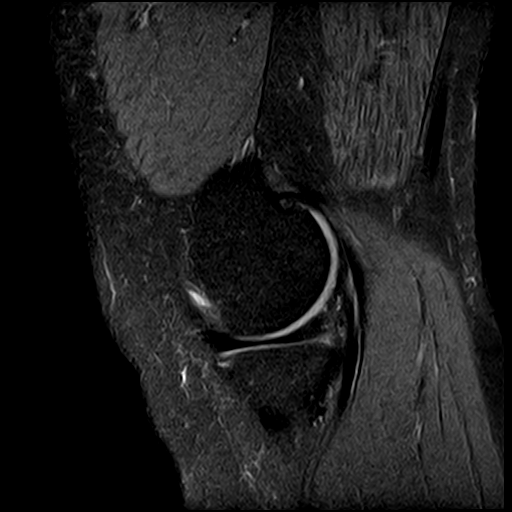
[im 15/30]
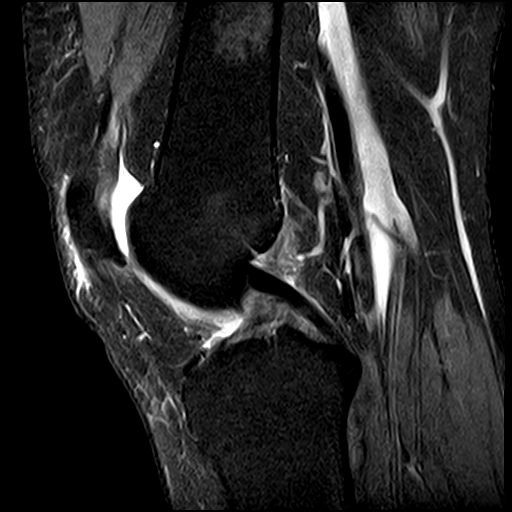
[im 22/30]
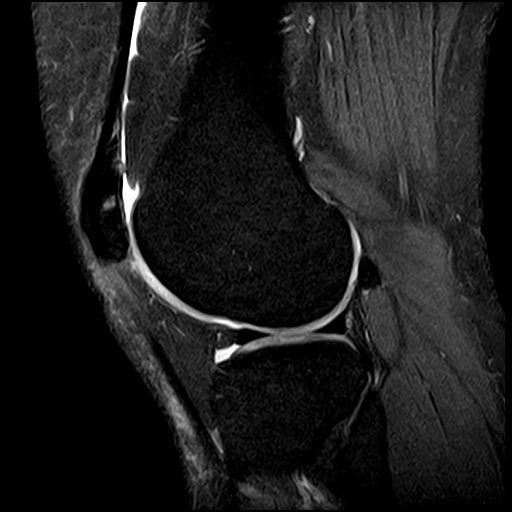
[im 30/30]
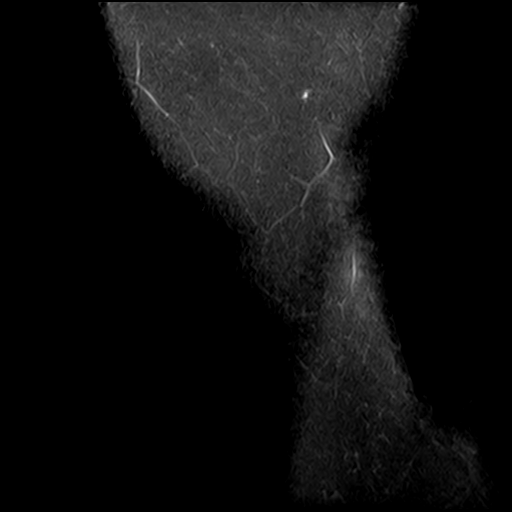

[Series 7: STIR · coronal · right · 3.0mm · 0.47mm/px · 5 of 30 slices shown]
[im 1/30]
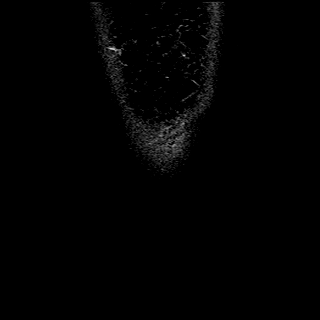
[im 8/30]
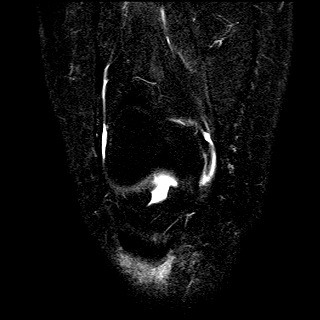
[im 15/30]
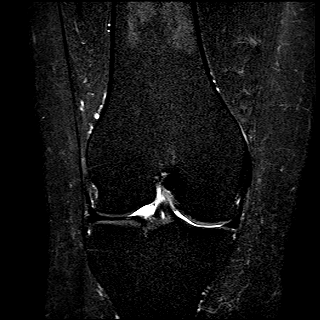
[im 22/30]
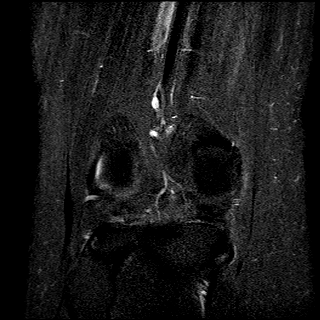
[im 30/30]
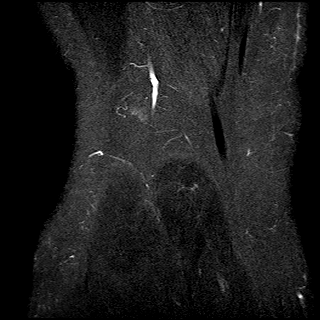

[Series 8: T1 · sagittal · right · 3.0mm · 0.39mm/px · 5 of 30 slices shown]
[im 1/30]
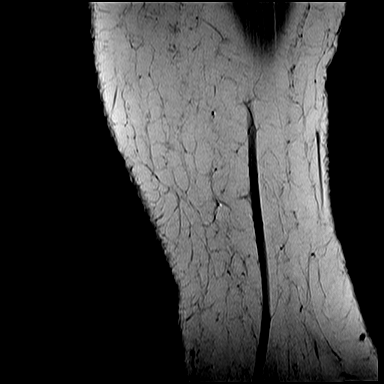
[im 8/30]
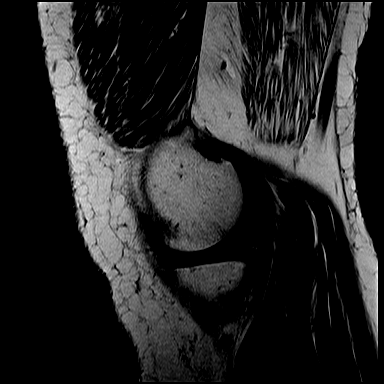
[im 15/30]
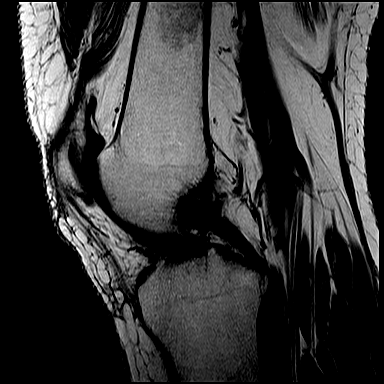
[im 22/30]
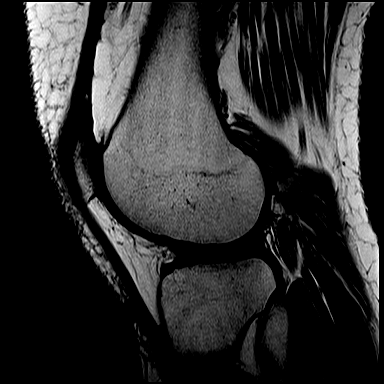
[im 30/30]
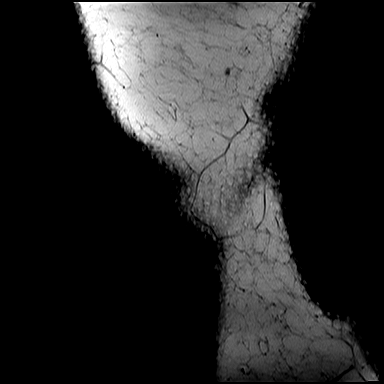

[Series 9: PD fat-sat · coronal · right · 3.0mm · 0.47mm/px · 5 of 30 slices shown (3 of 3)]
[im 1/30]
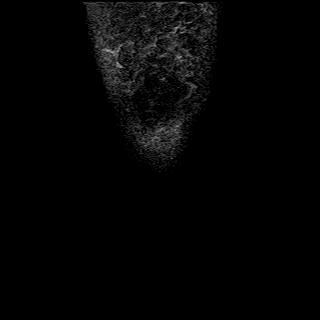
[im 8/30]
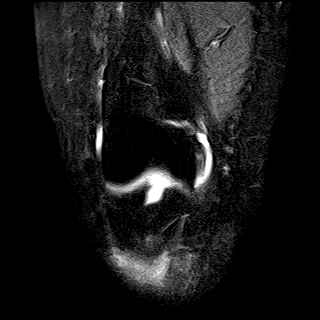
[im 15/30]
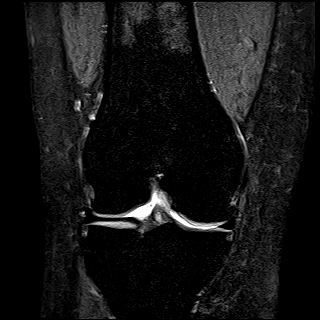
[im 22/30]
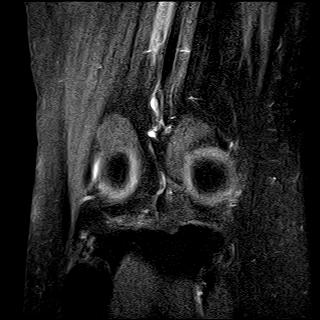
[im 30/30]
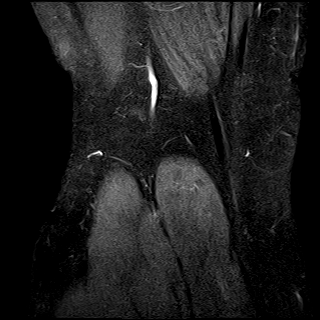

[40 of 40 positions shown; findings below may reference images not displayed]

FINDINGS: Menisci, cruciate and collateral ligaments are intact, within normal limits in morphology and signal intensity.  Hyaline cartilage of the tibiofibular articulations is well maintained.  There is grade 4 chondromalacia patella.  Extensor mechanism is intact.  Capsular attachments appear unremarkable.  Bone marrow signal intensity is normal.  There is no suprapatellar effusion or Baker's cyst.
IMPRESSION: 1. Intact menisci, cruciate and collateral ligaments. 

2. Grade 4 chondromalacia patella.

## 2021-06-22 IMAGING — MG 3D SCREENING MAMMO BIL W/CAD & TOMO
5 series · 7 of 24 positions shown · non-contrast
Comparison: Mammogram dated 12/11/2018.

------------- REPORT [id number] -------------
﻿

BETANCOURTH, SAHITHI
EXAM:  BILATERAL DIGITAL SCREENING MAMMOGRAM WITH 3D TOMOSYNTHESIS AND CAD
INDICATION: Asymptomatic 44-year-old with no family history of breast cancer.  Lifetime breast cancer risk 9.9%.

[L]
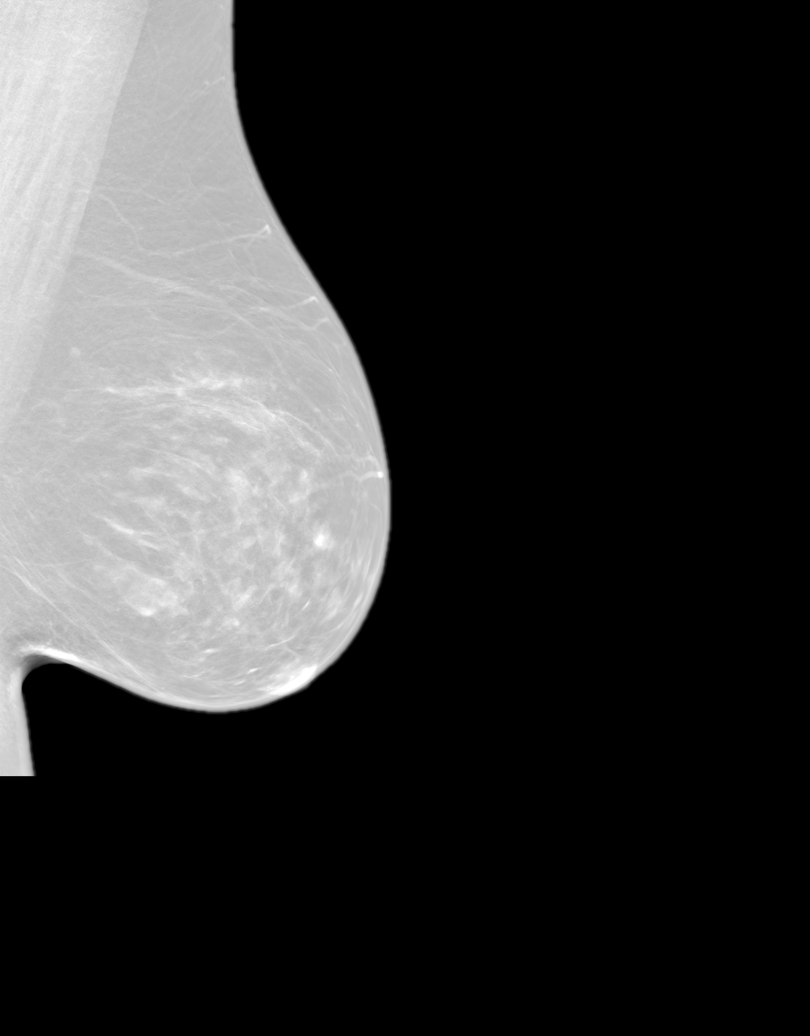

[R]
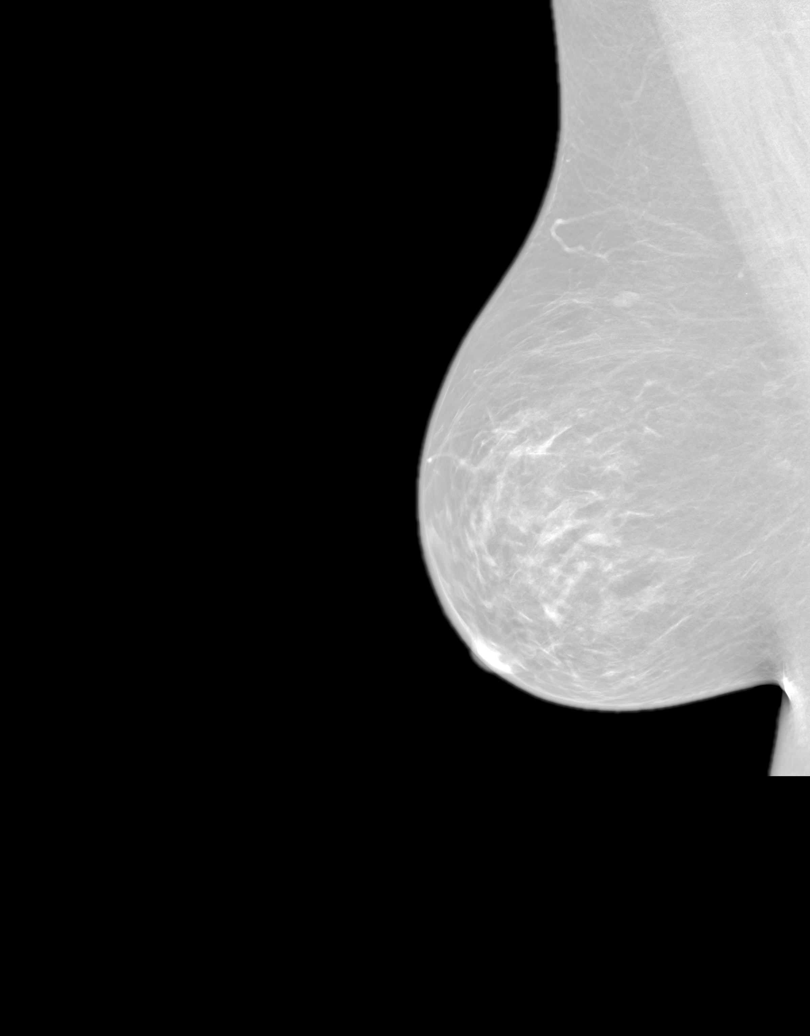

[R CC tomo · right · 0.10mm/px · 2 of 2 slices shown]
[im 1/2]
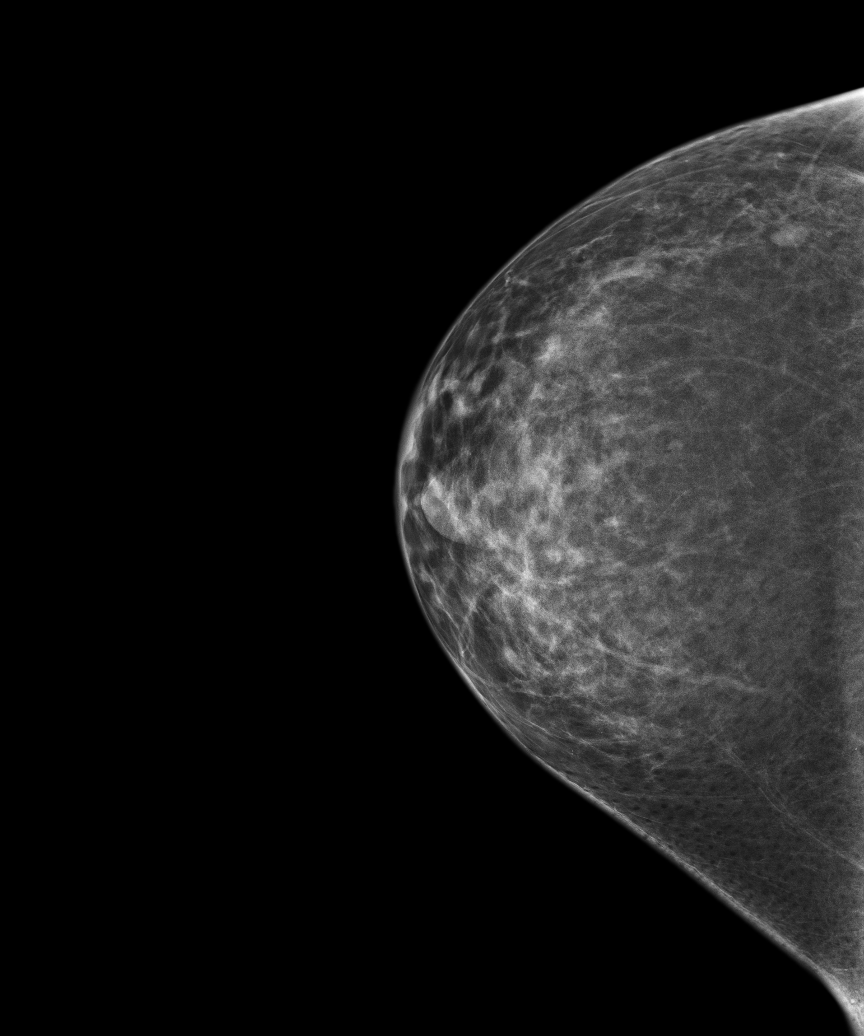
[im 2/2]
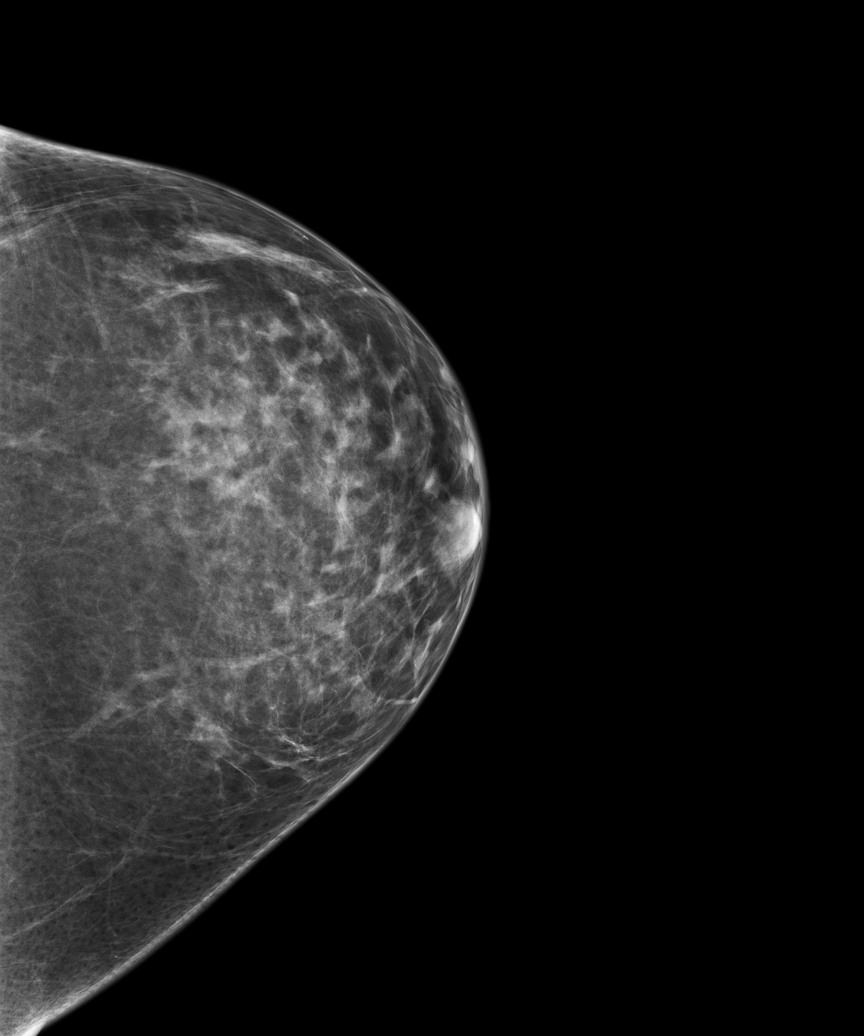

[3D SCREENING MAMMO BIL W/CAD & TOMO tomo · 2 acquisitions, 2 frames shown (1 of 2)]
[im 1/2]
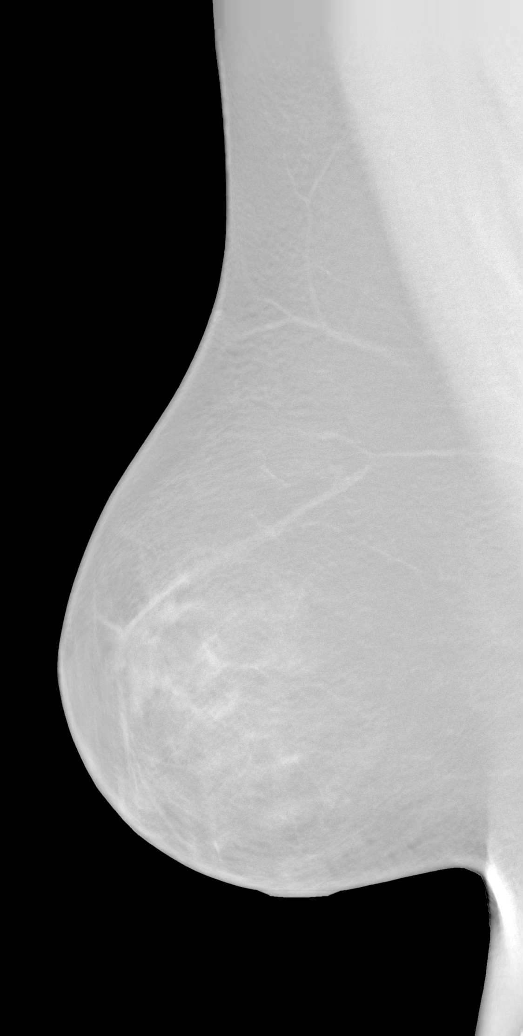
[im 2/2]
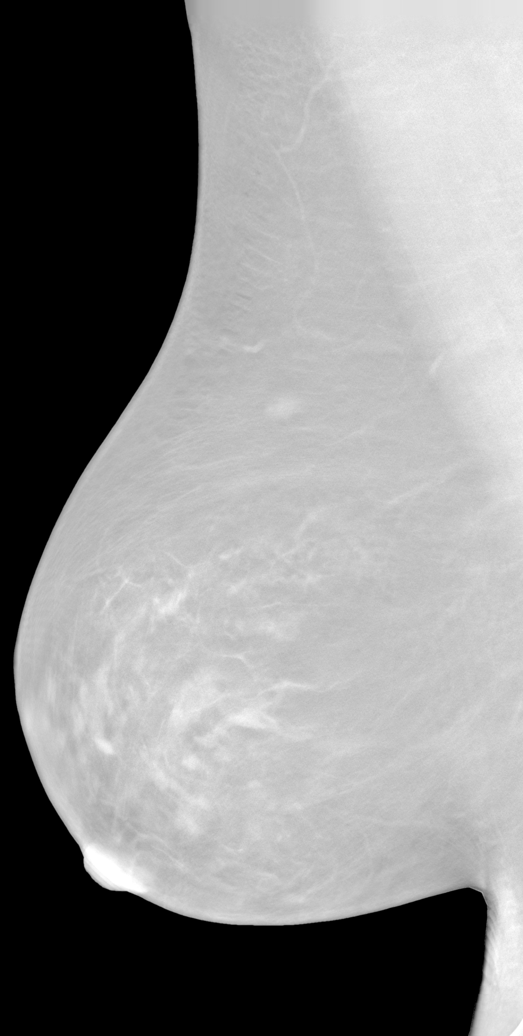

[3D SCREENING MAMMO BIL W/CAD & TOMO tomo (2 of 2) · tomo slice 14/86.0]
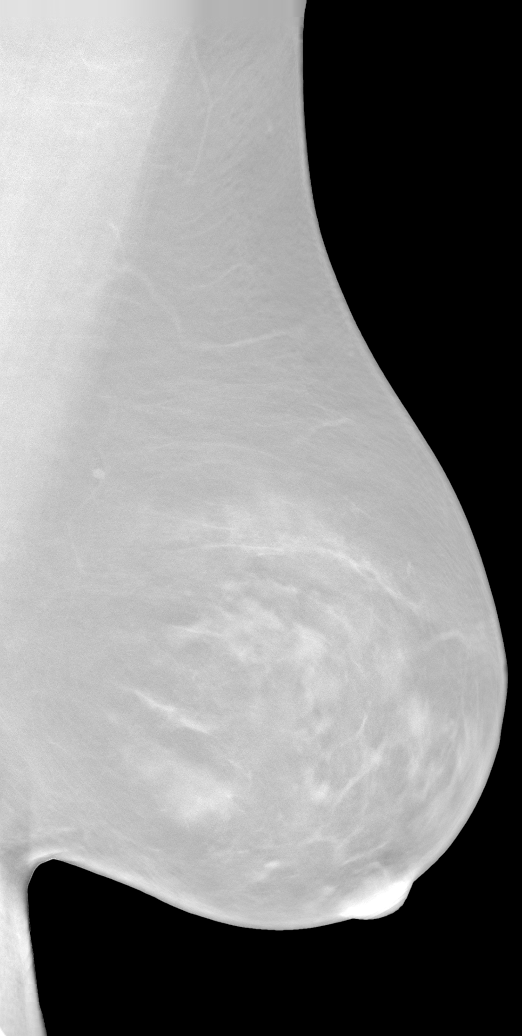

[7 of 24 positions shown; findings below may reference images not displayed]

FINDINGS: No focal mass or architectural changes are noted.  Slight asymmetry of breast tissues in the inferior mid left breast is stable.  No architectural change, calcifications, skin change or nipple changes are seen.
IMPRESSION: 1.  Stable mammographic findings.  Clinical and mammographic followup at 12 months. 

2.  BIRADS 2-Benign findings. Patient has been added in a reminder system with a target date for the next screening mammography.

3.  DENSITY CODE – B (Scattered areas of fibroglandular density). 

Final Assessment Code:

Bi-Rads 2 

BI-RADS 0
 Need additional imaging evaluation.

BI-RADS 1
 Negative mammogram.

BI-RADS 2
 Benign finding.

BI-RADS 3
 Probably benign finding; short-interval follow-up suggested.

BI-RADS 4
 Suspicious abnormality; biopsy should be considered.

BI-RADS 5
 Highly suggestive of malignancy; appropriate action should be taken.

BI-RADS 6
 Known biopsy-proven malignancy; appropriate action should be taken.

NOTE:
In compliance with Federal regulations, the results of this mammogram are being sent to the patient.

------------- REPORT [id number] -------------
[facility]
[address]
Tiger Ms.NARRARY, DA DRENA:
We wish to report the following on your recent mammography examination. We are sending a report to your referring physician or other health care provider. 
(       Normal/Negative:
No evidence of cancer.
This statement is mandated by the [place], Department of Health.
Your examination was performed by one of our technologists, who are registered radiological technologists and also specially certified in mammography:
___
Markland, Marjuan (M)

Your mammogram was interpreted by our radiologist.

( 
Collette Sedman, M.D.

(Annual Breast Examination by a physician or other health care provider
(Annual Mammography Screening beginning at age 40
(Monthly Breast Self Examination

## 2022-02-03 ENCOUNTER — Ambulatory Visit (RURAL_HEALTH_CENTER): Payer: MEDICAID | Attending: Family Medicine | Admitting: Family Medicine

## 2022-02-03 ENCOUNTER — Encounter (RURAL_HEALTH_CENTER): Payer: Self-pay | Admitting: Family Medicine

## 2022-02-03 ENCOUNTER — Other Ambulatory Visit: Payer: Self-pay

## 2022-02-03 VITALS — BP 135/74 | HR 72 | Temp 97.6°F | Resp 18 | Ht 68.0 in | Wt 241.0 lb

## 2022-02-03 DIAGNOSIS — E669 Obesity, unspecified: Secondary | ICD-10-CM | POA: Insufficient documentation

## 2022-02-03 DIAGNOSIS — G43909 Migraine, unspecified, not intractable, without status migrainosus: Secondary | ICD-10-CM | POA: Insufficient documentation

## 2022-02-03 DIAGNOSIS — K581 Irritable bowel syndrome with constipation: Secondary | ICD-10-CM | POA: Insufficient documentation

## 2022-02-03 DIAGNOSIS — M25561 Pain in right knee: Secondary | ICD-10-CM | POA: Insufficient documentation

## 2022-02-03 DIAGNOSIS — E785 Hyperlipidemia, unspecified: Secondary | ICD-10-CM | POA: Insufficient documentation

## 2022-02-03 DIAGNOSIS — G4700 Insomnia, unspecified: Secondary | ICD-10-CM | POA: Insufficient documentation

## 2022-02-03 DIAGNOSIS — K219 Gastro-esophageal reflux disease without esophagitis: Secondary | ICD-10-CM | POA: Insufficient documentation

## 2022-02-03 DIAGNOSIS — Z6836 Body mass index (BMI) 36.0-36.9, adult: Secondary | ICD-10-CM | POA: Insufficient documentation

## 2022-02-03 DIAGNOSIS — E559 Vitamin D deficiency, unspecified: Secondary | ICD-10-CM | POA: Insufficient documentation

## 2022-02-03 DIAGNOSIS — R7303 Prediabetes: Secondary | ICD-10-CM | POA: Insufficient documentation

## 2022-02-03 LAB — CBC WITH DIFF
BASOPHIL #: 0.1 10*3/uL (ref 0.00–2.50)
BASOPHIL %: 1 % (ref 0–3)
EOSINOPHIL #: 0.2 10*3/uL (ref 0.00–2.40)
EOSINOPHIL %: 3 % (ref 0–7)
HCT: 40.1 % (ref 37.0–47.0)
HGB: 13.8 g/dL (ref 12.5–16.0)
LYMPHOCYTE #: 1.7 10*3/uL — ABNORMAL LOW (ref 2.10–11.00)
LYMPHOCYTE %: 23 % — ABNORMAL LOW (ref 25–45)
MCH: 29.7 pg (ref 27.0–32.0)
MCHC: 34.3 g/dL (ref 32.0–36.0)
MCV: 86.7 fL (ref 78.0–99.0)
MONOCYTE #: 0.4 10*3/uL (ref 0.00–4.10)
MONOCYTE %: 5 % (ref 0–12)
MPV: 8.2 fL (ref 7.4–10.4)
NEUTROPHIL #: 5.1 10*3/uL (ref 4.10–29.00)
NEUTROPHIL %: 69 % (ref 40–76)
PLATELETS: 268 10*3/uL (ref 140–440)
RBC: 4.62 10*6/uL (ref 4.20–5.40)
RDW: 12.8 % (ref 11.6–14.8)
WBC: 7.4 10*3/uL (ref 4.0–10.5)
WBCS UNCORRECTED: 7.4 10*3/uL

## 2022-02-03 LAB — COMPREHENSIVE METABOLIC PNL, FASTING
ALBUMIN/GLOBULIN RATIO: 1.2 (ref 0.8–1.4)
ALBUMIN: 4.2 g/dL (ref 3.5–5.7)
ALKALINE PHOSPHATASE: 97 U/L (ref 34–104)
ALT (SGPT): 22 U/L (ref 7–52)
ANION GAP: 7 mmol/L — ABNORMAL LOW (ref 10–20)
AST (SGOT): 17 U/L (ref 13–39)
BILIRUBIN TOTAL: 0.4 mg/dL (ref 0.3–1.2)
BUN/CREA RATIO: 15 (ref 6–22)
BUN: 14 mg/dL (ref 7–25)
CALCIUM, CORRECTED: 8.9 mg/dL (ref 8.9–10.8)
CALCIUM: 9.1 mg/dL (ref 8.6–10.3)
CHLORIDE: 107 mmol/L (ref 98–107)
CO2 TOTAL: 23 mmol/L (ref 21–31)
CREATININE: 0.96 mg/dL (ref 0.60–1.30)
ESTIMATED GFR: 75 mL/min/{1.73_m2} (ref >59)
GLOBULIN: 3.4 (ref 2.9–5.4)
GLUCOSE: 91 mg/dL (ref 74–109)
OSMOLALITY, CALCULATED: 274 mOsm/kg (ref 270–290)
POTASSIUM: 3.9 mmol/L (ref 3.5–5.1)
PROTEIN TOTAL: 7.6 g/dL (ref 6.4–8.9)
SODIUM: 137 mmol/L (ref 136–145)

## 2022-02-03 LAB — LIPID PANEL
CHOL/HDL RATIO: 3.3
CHOLESTEROL: 156 mg/dL (ref 136–290)
HDL CHOL: 48 mg/dL (ref 23–92)
LDL CALC: 87 mg/dL (ref 0–100)
TRIGLYCERIDES: 105 mg/dL (ref <=150)
VLDL CALC: 21 mg/dL (ref 0–50)

## 2022-02-03 LAB — VITAMIN D 25 TOTAL: VITAMIN D: 89 ng/mL (ref 30–100)

## 2022-02-03 LAB — HGA1C (HEMOGLOBIN A1C WITH EST AVG GLUCOSE): HEMOGLOBIN A1C: 5.5 % (ref 4.0–6.0)

## 2022-02-03 LAB — THYROID STIMULATING HORMONE (SENSITIVE TSH): TSH: 1.749 u[IU]/mL (ref 0.450–5.330)

## 2022-02-03 LAB — VITAMIN B12: VITAMIN B 12: >1500 pg/mL — ABNORMAL HIGH (ref 180–914)

## 2022-02-03 LAB — MAGNESIUM: MAGNESIUM: 1.9 mg/dL (ref 1.9–2.7)

## 2022-02-03 MED ORDER — MELOXICAM 7.5 MG TABLET
7.5000 mg | ORAL_TABLET | Freq: Every day | ORAL | 3 refills | Status: DC
Start: 2022-02-03 — End: 2022-05-25

## 2022-02-03 MED ORDER — CETIRIZINE 10 MG TABLET
10.0000 mg | ORAL_TABLET | Freq: Every day | ORAL | 3 refills | Status: DC
Start: 2022-02-03 — End: 2022-05-25

## 2022-02-03 MED ORDER — ROSUVASTATIN 5 MG TABLET
5.0000 mg | ORAL_TABLET | Freq: Every day | ORAL | 3 refills | Status: DC
Start: 2022-02-03 — End: 2022-05-25

## 2022-02-03 MED ORDER — FAMOTIDINE 40 MG TABLET
40.0000 mg | ORAL_TABLET | Freq: Every day | ORAL | 3 refills | Status: DC
Start: 2022-02-03 — End: 2022-05-25

## 2022-02-03 MED ORDER — LINACLOTIDE 145 MCG CAPSULE
145.0000 ug | ORAL_CAPSULE | Freq: Every morning | ORAL | 3 refills | Status: DC
Start: 2022-02-03 — End: 2022-05-25

## 2022-02-03 MED ORDER — OMEPRAZOLE 40 MG CAPSULE,DELAYED RELEASE
40.0000 mg | DELAYED_RELEASE_CAPSULE | Freq: Every day | ORAL | 3 refills | Status: DC
Start: 2022-02-03 — End: 2022-05-25

## 2022-02-03 MED ORDER — CYCLOBENZAPRINE 10 MG TABLET
10.0000 mg | ORAL_TABLET | Freq: Two times a day (BID) | ORAL | 3 refills | Status: DC
Start: 2022-02-03 — End: 2022-05-25

## 2022-02-03 MED ORDER — CHOLECALCIFEROL (VITAMIN D3) 1,250 MCG (50,000 UNIT) CAPSULE
50000.0000 [IU] | ORAL_CAPSULE | ORAL | 3 refills | Status: DC
Start: 2022-02-03 — End: 2022-05-25

## 2022-02-03 MED ORDER — TOPIRAMATE 100 MG TABLET
100.0000 mg | ORAL_TABLET | Freq: Two times a day (BID) | ORAL | 3 refills | Status: DC
Start: 2022-02-03 — End: 2022-05-25

## 2022-02-03 MED ORDER — METOCLOPRAMIDE 5 MG TABLET
5.0000 mg | ORAL_TABLET | Freq: Two times a day (BID) | ORAL | 3 refills | Status: DC
Start: 2022-02-03 — End: 2022-05-25

## 2022-02-03 MED ORDER — BETHANECHOL CHLORIDE 5 MG TABLET
5.0000 mg | ORAL_TABLET | Freq: Two times a day (BID) | ORAL | 3 refills | Status: DC
Start: 2022-02-03 — End: 2022-05-25

## 2022-02-03 MED ORDER — TRAZODONE 300 MG TABLET
300.0000 mg | ORAL_TABLET | Freq: Two times a day (BID) | ORAL | 3 refills | Status: DC
Start: 2022-02-03 — End: 2022-02-04

## 2022-02-03 NOTE — Progress Notes (Signed)
FAMILY MEDICINE, [facility]  [place] [address]       Name: Lindsay Crawford MRN:  [id number]   Date of Birth: [birth year] Age: 46 y.o.   Date: 02/03/2022  Time: 13:42     Provider: Feliberto Harts, DO    Reason for visit: Medication Refill, Referrals (Weight loss), and Knee Pain (right)      History of Present Illness:  Lindsay Crawford is a 46 y.o. female presents in follow-up of her GERD, dysfunctional uterine bleeding, hyperlipidemia, and migraine headaches with complaints of weight gain, worsening right knee pain, and constipation.    She has decided that she would like to have weight loss surgery.  She had originally lost 20lbs, but she has regained seven despite diet and exercise changes.    She has worsening right knee pain. She started having issues over a year ago. Xrays and referral to local orthopedist was arranged. She had a joint injection followed by a MRI. I do not have the results of this. The pt notes that she was told that she did not need surgery, but she would benefit from losing weight.    Her constipation has really worsened. She has tried enemas, stool softeners, and her gi physician has recently added Miralax. She still does not have a bowel movement but once weekly.    Chronic back pain: uses flexeril for her back pain; She has known herniated disc. She takes them at night as her pain is worse at night and they cause her to sleep better.    GERD and gastroparesis: Still follows with Dr. Lilia Pro (GI doctor in [place]).  She has better control of her heartburn but still has breakthrough symptoms despite her current medications. She had an EGD and has a hiatal hernia per the pt.    Migraines: she takes Topamax 100 mg twice daily with decreased migraine frequency    Vitamin d def: She is back on vitamin d as her levels dropped below normal when she held the supplement for a short time frame.    Hyperlipidemia: She is taking Crestor without any  adverse effects.    Insomnia:  She is taking trazodone '150mg'$  with three hours of sleep. She sometimes uses two muscle relaxers and trazodone together as she desires to rest better.    Historical Data    Past Medical History:  Past Medical History:   Diagnosis Date   . Body mass index 35.0-35.9, adult    . Borderline diabetes    . Chronic low back pain    . Dizziness    . DUB (dysfunctional uterine bleeding)    . DUB (dysfunctional uterine bleeding)    . Gastroparesis    . Gastroparesis    . GERD (gastroesophageal reflux disease)    . Hyperglycemia    . Hyperlipidemia LDL goal <70    . Insomnia    . Knee pain, bilateral    . Migraine    . Paraspinal muscle spasm    . Vitamin D deficiency          Past Surgical History:  Past Surgical History:   Procedure Laterality Date   . CESAREAN SECTION  2004   . COLONOSCOPY      Dr. Lilia Pro in [place]   . ESOPHAGOSCOPY / EGD     . HX APPENDECTOMY     . HX BACK SURGERY     . HX LAP CHOLECYSTECTOMY  Allergies:  Allergies   Allergen Reactions   . Other Rash     Allergic to the sun      Medications:  Current Outpatient Medications   Medication Sig   . bethanechol chloride (URECHOLINE) 5 mg Oral Tablet Take 1 Tablet (5 mg total) by mouth Twice daily for 90 days   . cetirizine (ZYRTEC) 10 mg Oral Tablet Take 1 Tablet (10 mg total) by mouth Once a day for 90 days   . cholecalciferol, vitamin D3, 1,250 mcg (50,000 unit) Oral Capsule Take 1 Capsule (50,000 Units total) by mouth Every 7 days for 90 days   . cyclobenzaprine (FLEXERIL) 10 mg Oral Tablet Take 1 Tablet (10 mg total) by mouth Twice daily for 90 days   . famotidine (PEPCID) 40 mg Oral Tablet Take 1 Tablet (40 mg total) by mouth Once a day for 90 days   . linaCLOtide (LINZESS) 145 mcg Oral Capsule Take 1 Capsule (145 mcg total) by mouth Every morning for 90 days   . medroxyPROGESTERone (PROVERA) 10 mg Oral Tablet Take 1 Tablet (10 mg total) by mouth Twice daily   . meloxicam (MOBIC) 7.5 mg Oral Tablet Take 1 Tablet  (7.5 mg total) by mouth Once a day for 90 days   . metoclopramide HCl (REGLAN) 5 mg Oral Tablet Take 1 Tablet (5 mg total) by mouth Twice daily for 90 days   . omeprazole (PRILOSEC) 40 mg Oral Capsule, Delayed Release(E.C.) Take 1 Capsule (40 mg total) by mouth Once a day for 90 days   . polyethylene glycol (MIRALAX) 17 gram/dose Oral Powder Take 1 g by mouth Once a day   . rosuvastatin (CRESTOR) 5 mg Oral Tablet Take 1 Tablet (5 mg total) by mouth Once a day for 90 days   . topiramate (TOPAMAX) 100 mg Oral Tablet Take 1 Tablet (100 mg total) by mouth Twice daily for 90 days   . TraZODone (DESYREL) 300 mg Oral Tablet Take 1 Tablet (300 mg total) by mouth Twice daily     Family History:  Family Medical History:     Problem Relation (Age of Onset)    COPD Father    Coronary Artery Disease Brother    Diabetes type II Father    Hypertension (High Blood Pressure) Mother, Father    Kidney Disease Father    Throat cancer Sister          Social History:  Social History     Socioeconomic History   . Marital status: Widowed   . Number of children: 1   . Years of education: 12+   . Highest education level: Associate degree: occupational, Hotel manager, or vocational program   Tobacco Use   . Smoking status: Never   . Smokeless tobacco: Never   Vaping Use   . Vaping Use: Never used   Substance and Sexual Activity   . Alcohol use: Never   . Drug use: Never   . Sexual activity: Yes     Partners: Male     Birth control/protection: None           Review of Systems:  Any pertinent Review of Systems as addressed in the HPI above.    Physical Exam:  Vital Signs:  Vitals:    02/03/22 1151   BP: 135/74   Pulse: 72   Resp: 18   Temp: 36.4 C (97.6 F)   TempSrc: Temporal   SpO2: 98%   Weight: 109 kg (241 lb)   Height: 1.727  m ('5\' 8"'$ )   BMI: 36.72     Physical Exam  Vitals reviewed.   Constitutional:       Appearance: She is obese.   HENT:      Head: Normocephalic and atraumatic.      Right Ear: Tympanic membrane normal.      Left Ear:  Tympanic membrane normal.      Nose: Nose normal.      Mouth/Throat:      Mouth: Mucous membranes are moist.   Eyes:      Conjunctiva/sclera: Conjunctivae normal.   Cardiovascular:      Rate and Rhythm: Normal rate and regular rhythm.      Pulses: Normal pulses.      Heart sounds: Normal heart sounds.   Pulmonary:      Effort: Pulmonary effort is normal.      Breath sounds: Normal breath sounds.   Abdominal:      General: There is distension.      Tenderness: There is no abdominal tenderness.   Musculoskeletal:      Cervical back: Normal range of motion and neck supple.      Right knee: Effusion, bony tenderness and crepitus present. Decreased range of motion. No LCL laxity, MCL laxity, ACL laxity or PCL laxity. Abnormal patellar mobility. Normal pulse.      Instability Tests: Medial McMurray test positive.   Lymphadenopathy:      Cervical: No cervical adenopathy.   Skin:     General: Skin is warm and dry.      Capillary Refill: Capillary refill takes less than 2 seconds.   Neurological:      General: No focal deficit present.      Mental Status: She is alert and oriented to person, place, and time.      Cranial Nerves: Cranial nerves 2-12 are intact.      Sensory: Sensation is intact.      Motor: Motor function is intact.      Coordination: Coordination is intact.      Gait: Gait is intact.   Psychiatric:         Attention and Perception: Attention normal.         Mood and Affect: Mood normal.         Speech: Speech normal.         Behavior: Behavior normal. Behavior is cooperative.         Thought Content: Thought content normal.         Cognition and Memory: Cognition normal.         Judgment: Judgment normal.          Assessment/Plan:    ICD-10-CM    1. Borderline diabetes  R73.03 Referral to External Provider     CBC/DIFF     COMPREHENSIVE METABOLIC PNL, FASTING     THYROID STIMULATING HORMONE (SENSITIVE TSH)     LIPID PANEL     HGA1C (HEMOGLOBIN A1C WITH EST AVG GLUCOSE)     MAGNESIUM     VITAMIN B12      VITAMIN D 25 TOTAL      2. Obesity (BMI 35.0-39.9 without comorbidity)  E66.9 Referral to External Provider     CBC/DIFF     COMPREHENSIVE METABOLIC PNL, FASTING     THYROID STIMULATING HORMONE (SENSITIVE TSH)     LIPID PANEL     HGA1C (HEMOGLOBIN A1C WITH EST AVG GLUCOSE)     MAGNESIUM     VITAMIN B12  VITAMIN D 25 TOTAL      3. Migraine  G43.909 CBC/DIFF     COMPREHENSIVE METABOLIC PNL, FASTING     THYROID STIMULATING HORMONE (SENSITIVE TSH)     LIPID PANEL     HGA1C (HEMOGLOBIN A1C WITH EST AVG GLUCOSE)     MAGNESIUM     VITAMIN B12     VITAMIN D 25 TOTAL      4. Vitamin D deficiency  E55.9 CBC/DIFF     COMPREHENSIVE METABOLIC PNL, FASTING     THYROID STIMULATING HORMONE (SENSITIVE TSH)     LIPID PANEL     HGA1C (HEMOGLOBIN A1C WITH EST AVG GLUCOSE)     MAGNESIUM     VITAMIN B12     VITAMIN D 25 TOTAL      5. Hyperlipidemia, unspecified hyperlipidemia type  E78.5 CBC/DIFF     COMPREHENSIVE METABOLIC PNL, FASTING     THYROID STIMULATING HORMONE (SENSITIVE TSH)     LIPID PANEL     HGA1C (HEMOGLOBIN A1C WITH EST AVG GLUCOSE)     MAGNESIUM     VITAMIN B12     VITAMIN D 25 TOTAL      6. Insomnia, unspecified type  G47.00 CBC/DIFF     COMPREHENSIVE METABOLIC PNL, FASTING     THYROID STIMULATING HORMONE (SENSITIVE TSH)     LIPID PANEL     HGA1C (HEMOGLOBIN A1C WITH EST AVG GLUCOSE)     MAGNESIUM     VITAMIN B12     VITAMIN D 25 TOTAL      7. Chronic GERD  K21.9 Referral to External Provider     CBC/DIFF     COMPREHENSIVE METABOLIC PNL, FASTING     THYROID STIMULATING HORMONE (SENSITIVE TSH)     LIPID PANEL     HGA1C (HEMOGLOBIN A1C WITH EST AVG GLUCOSE)     MAGNESIUM     VITAMIN B12     VITAMIN D 25 TOTAL      8. Knee pain, right  M25.561 Referral to External Provider      9. Irritable bowel syndrome with constipation  K58.1          Orders Placed This Encounter   . CBC/DIFF   . COMPREHENSIVE METABOLIC PNL, FASTING   . THYROID STIMULATING HORMONE (SENSITIVE TSH)   . LIPID PANEL   . HGA1C (HEMOGLOBIN A1C WITH EST AVG  GLUCOSE)   . MAGNESIUM   . VITAMIN B12   . VITAMIN D 25 TOTAL   . CBC WITH DIFF   . Referral to External Provider   . Referral to External Provider   . topiramate (TOPAMAX) 100 mg Oral Tablet   . rosuvastatin (CRESTOR) 5 mg Oral Tablet   . omeprazole (PRILOSEC) 40 mg Oral Capsule, Delayed Release(E.C.)   . metoclopramide HCl (REGLAN) 5 mg Oral Tablet   . meloxicam (MOBIC) 7.5 mg Oral Tablet   . famotidine (PEPCID) 40 mg Oral Tablet   . cyclobenzaprine (FLEXERIL) 10 mg Oral Tablet   . cholecalciferol, vitamin D3, 1,250 mcg (50,000 unit) Oral Capsule   . cetirizine (ZYRTEC) 10 mg Oral Tablet   . bethanechol chloride (URECHOLINE) 5 mg Oral Tablet   . TraZODone (DESYREL) 300 mg Oral Tablet   . linaCLOtide (LINZESS) 145 mcg Oral Capsule     Stop trazodone '150mg'$ . Rx given for 300 mg trazodone to use at night. Stop using muscle relaxers at night to sleep.    Add linzess. Samples of linzess 175mg to take qday given #  2 boxes. Has tried and failed suppositories, enemas, colace, and prescribed Miralax.  Pt told to take on an empty stomach 30 minutes before eating/other medications.  May need to increase dose if not improved in two weeks. Can discuss with GI at her appt there.    Migraines well controlled on topamax    Recommended diet and exercise changes. Pt previously lost 20 lbs with changes but has an increase of 7 lbs from her visit with me in November until now.  Refer to bariatric surgeon. Pt has GERD not responding to multiple medications as well as borderline diabetes and is a good candidate for weight loss surgery.  She prefers to see someone in [place].      For her knee pain, she has had a MRI last year after seeing Dr. Lilia Pro. Records release today to get a copy of this.  She has tried joint injection there which did not help. Recommended Ice/Heat and continue use of Mobic.  Refer to another orthopedist for a second opinion.         F/u in 3 months or sooner prn    Feliberto Harts, DO     Portions of this note  may be dictated using voice recognition software or a dictation service. Variances in spelling and vocabulary are possible and unintentional. Not all errors are caught/corrected. Please notify the [facility] if any discrepancies are noted or if the meaning of any statement is not clear.

## 2022-02-03 NOTE — Nursing Note (Signed)
Patient here for medication refills and a referral for weight loss, and patient states that she is having right knee pain.

## 2022-02-04 ENCOUNTER — Telehealth (RURAL_HEALTH_CENTER): Payer: Self-pay | Admitting: Family Medicine

## 2022-02-04 MED ORDER — TRAZODONE 300 MG TABLET
300.0000 mg | ORAL_TABLET | Freq: Every day | ORAL | 3 refills | Status: DC
Start: 2022-02-04 — End: 2022-05-25

## 2022-02-04 NOTE — Telephone Encounter (Signed)
[facility] called, needs clarification on Trazodone, if you increased to 300 at bedtime and the total quantity needed

## 2022-02-09 ENCOUNTER — Telehealth (RURAL_HEALTH_CENTER): Payer: Self-pay | Admitting: Family Medicine

## 2022-02-09 NOTE — Telephone Encounter (Signed)
-----   Message from [place], [place] sent at 02/07/2022  5:33 PM EDT -----  Blood counts are stable  Electrolytes are normal  Chronic kidney disease stage 2. Avoid motrin/aleve/ibuprofen  Magnesium is ok  LDL is in fair control   Overall blood sugar in good control  Continue current medications

## 2022-02-10 NOTE — Telephone Encounter (Signed)
-----   Message from [place], [place] sent at 02/07/2022  5:33 PM EDT -----  Blood counts are stable  Electrolytes are normal  Chronic kidney disease stage 2. Avoid motrin/aleve/ibuprofen  Magnesium is ok  LDL is in fair control   Overall blood sugar in good control  Continue current medications

## 2022-02-10 NOTE — Telephone Encounter (Signed)
Tried to call patient but no answer and it went straight to voicemail, I left voicemail to call me back.

## 2022-02-15 ENCOUNTER — Telehealth (RURAL_HEALTH_CENTER): Payer: Self-pay | Admitting: Family Medicine

## 2022-02-15 NOTE — Telephone Encounter (Signed)
-----   Message from [place], [place] sent at 02/07/2022  5:33 PM EDT -----  Blood counts are stable  Electrolytes are normal  Chronic kidney disease stage 2. Avoid motrin/aleve/ibuprofen  Magnesium is ok  LDL is in fair control   Overall blood sugar in good control  Continue current medications

## 2022-02-15 NOTE — Telephone Encounter (Signed)
Tried to call patient, it went straight to voicemail and voicemail was full.

## 2022-02-18 ENCOUNTER — Telehealth (RURAL_HEALTH_CENTER): Payer: Self-pay | Admitting: Family Medicine

## 2022-02-18 ENCOUNTER — Encounter (RURAL_HEALTH_CENTER): Payer: Self-pay | Admitting: Family Medicine

## 2022-02-18 NOTE — Telephone Encounter (Signed)
Tried calling patient but it went to voicemail, and voicemail is full.

## 2022-02-18 NOTE — Telephone Encounter (Signed)
-----   Message from [place], [place] sent at 02/07/2022  5:33 PM EDT -----  Blood counts are stable  Electrolytes are normal  Chronic kidney disease stage 2. Avoid motrin/aleve/ibuprofen  Magnesium is ok  LDL is in fair control   Overall blood sugar in good control  Continue current medications

## 2022-05-06 ENCOUNTER — Encounter (RURAL_HEALTH_CENTER): Payer: Self-pay | Admitting: Family Medicine

## 2022-05-06 ENCOUNTER — Ambulatory Visit (RURAL_HEALTH_CENTER): Payer: Self-pay | Admitting: Family Medicine

## 2022-05-25 ENCOUNTER — Ambulatory Visit: Payer: MEDICAID | Attending: Family Medicine | Admitting: Family Medicine

## 2022-05-25 ENCOUNTER — Ambulatory Visit (RURAL_HEALTH_CENTER): Payer: MEDICAID | Attending: Family Medicine | Admitting: Family Medicine

## 2022-05-25 ENCOUNTER — Encounter (RURAL_HEALTH_CENTER): Payer: Self-pay | Admitting: Family Medicine

## 2022-05-25 ENCOUNTER — Other Ambulatory Visit: Payer: Self-pay

## 2022-05-25 VITALS — BP 110/70 | HR 55 | Ht 68.0 in | Wt 242.0 lb

## 2022-05-25 DIAGNOSIS — G4700 Insomnia, unspecified: Secondary | ICD-10-CM | POA: Insufficient documentation

## 2022-05-25 DIAGNOSIS — N938 Other specified abnormal uterine and vaginal bleeding: Secondary | ICD-10-CM | POA: Insufficient documentation

## 2022-05-25 DIAGNOSIS — E559 Vitamin D deficiency, unspecified: Secondary | ICD-10-CM | POA: Insufficient documentation

## 2022-05-25 DIAGNOSIS — E669 Obesity, unspecified: Secondary | ICD-10-CM | POA: Insufficient documentation

## 2022-05-25 DIAGNOSIS — G43809 Other migraine, not intractable, without status migrainosus: Secondary | ICD-10-CM | POA: Insufficient documentation

## 2022-05-25 DIAGNOSIS — R7303 Prediabetes: Secondary | ICD-10-CM | POA: Insufficient documentation

## 2022-05-25 DIAGNOSIS — K219 Gastro-esophageal reflux disease without esophagitis: Secondary | ICD-10-CM | POA: Insufficient documentation

## 2022-05-25 DIAGNOSIS — G43709 Chronic migraine without aura, not intractable, without status migrainosus: Secondary | ICD-10-CM

## 2022-05-25 DIAGNOSIS — E785 Hyperlipidemia, unspecified: Secondary | ICD-10-CM | POA: Insufficient documentation

## 2022-05-25 DIAGNOSIS — M62830 Muscle spasm of back: Secondary | ICD-10-CM | POA: Insufficient documentation

## 2022-05-25 DIAGNOSIS — E611 Iron deficiency: Secondary | ICD-10-CM | POA: Insufficient documentation

## 2022-05-25 DIAGNOSIS — M62838 Other muscle spasm: Secondary | ICD-10-CM | POA: Insufficient documentation

## 2022-05-25 DIAGNOSIS — G43909 Migraine, unspecified, not intractable, without status migrainosus: Secondary | ICD-10-CM | POA: Insufficient documentation

## 2022-05-25 DIAGNOSIS — Z6836 Body mass index (BMI) 36.0-36.9, adult: Secondary | ICD-10-CM | POA: Insufficient documentation

## 2022-05-25 DIAGNOSIS — K3184 Gastroparesis: Secondary | ICD-10-CM | POA: Insufficient documentation

## 2022-05-25 LAB — COMPREHENSIVE METABOLIC PANEL, NON-FASTING
ALBUMIN/GLOBULIN RATIO: 1.5 — ABNORMAL HIGH (ref 0.8–1.4)
ALBUMIN: 4.6 g/dL (ref 3.5–5.7)
ALKALINE PHOSPHATASE: 67 U/L (ref 34–104)
ALT (SGPT): 22 U/L (ref 7–52)
ANION GAP: 7 mmol/L — ABNORMAL LOW (ref 10–20)
AST (SGOT): 17 U/L (ref 13–39)
BILIRUBIN TOTAL: 0.5 mg/dL (ref 0.3–1.2)
BUN/CREA RATIO: 10 (ref 6–22)
BUN: 10 mg/dL (ref 7–25)
CALCIUM, CORRECTED: 8.8 mg/dL — ABNORMAL LOW (ref 8.9–10.8)
CALCIUM: 9.4 mg/dL (ref 8.6–10.3)
CHLORIDE: 111 mmol/L — ABNORMAL HIGH (ref 98–107)
CO2 TOTAL: 23 mmol/L (ref 21–31)
CREATININE: 0.99 mg/dL (ref 0.60–1.30)
ESTIMATED GFR: 72 mL/min/{1.73_m2} (ref >59)
GLOBULIN: 3.1 (ref 2.9–5.4)
GLUCOSE: 114 mg/dL — ABNORMAL HIGH (ref 74–109)
OSMOLALITY, CALCULATED: 281 mOsm/kg (ref 270–290)
POTASSIUM: 4 mmol/L (ref 3.5–5.1)
PROTEIN TOTAL: 7.7 g/dL (ref 6.4–8.9)
SODIUM: 141 mmol/L (ref 136–145)

## 2022-05-25 LAB — IRON TRANSFERRIN AND TIBC
IRON (TRANSFERRIN) SATURATION: 27 % (ref 15–50)
IRON: 87 ug/dL (ref 50–212)
TOTAL IRON BINDING CAPACITY: 328 ug/dL (ref 250–450)
TRANSFERRIN: 234 mg/dL (ref 203–362)
UIBC: 241 ug/dL (ref 130–375)

## 2022-05-25 LAB — CBC WITH DIFF
BASOPHIL #: 0.1 10*3/uL (ref 0.00–0.30)
BASOPHIL %: 1 % (ref 0–3)
EOSINOPHIL #: 0.2 10*3/uL (ref 0.00–0.80)
EOSINOPHIL %: 3 % (ref 0–7)
HCT: 40.9 % (ref 37.0–47.0)
HGB: 14.2 g/dL (ref 12.5–16.0)
LYMPHOCYTE #: 2 10*3/uL (ref 1.10–5.00)
LYMPHOCYTE %: 22 % — ABNORMAL LOW (ref 25–45)
MCH: 30.2 pg (ref 27.0–32.0)
MCHC: 34.8 g/dL (ref 32.0–36.0)
MCV: 86.6 fL (ref 78.0–99.0)
MONOCYTE #: 0.5 10*3/uL (ref 0.00–1.30)
MONOCYTE %: 6 % (ref 0–12)
MPV: 8.3 fL (ref 7.4–10.4)
NEUTROPHIL #: 6.2 10*3/uL (ref 1.80–8.40)
NEUTROPHIL %: 69 % (ref 40–76)
PLATELETS: 280 10*3/uL (ref 140–440)
RBC: 4.72 10*6/uL (ref 4.20–5.40)
RDW: 13.2 % (ref 11.6–14.8)
WBC: 9 10*3/uL (ref 4.0–10.5)
WBCS UNCORRECTED: 9 10*3/uL

## 2022-05-25 LAB — LIPID PANEL
CHOL/HDL RATIO: 3.4
CHOLESTEROL: 185 mg/dL (ref <200)
HDL CHOL: 55 mg/dL (ref 23–92)
LDL CALC: 102 mg/dL — ABNORMAL HIGH (ref 0–100)
TRIGLYCERIDES: 138 mg/dL (ref <=150)
VLDL CALC: 28 mg/dL (ref 0–50)

## 2022-05-25 LAB — THYROID STIMULATING HORMONE (SENSITIVE TSH): TSH: 1.687 u[IU]/mL (ref 0.450–5.330)

## 2022-05-25 LAB — VITAMIN D 25 TOTAL: VITAMIN D: 75 ng/mL (ref 30–100)

## 2022-05-25 LAB — VITAMIN B12: VITAMIN B 12: >1500 pg/mL — ABNORMAL HIGH (ref 180–914)

## 2022-05-25 LAB — MAGNESIUM: MAGNESIUM: 1.8 mg/dL — ABNORMAL LOW (ref 1.9–2.7)

## 2022-05-25 MED ORDER — FAMOTIDINE 40 MG TABLET
40.0000 mg | ORAL_TABLET | Freq: Every evening | ORAL | 3 refills | Status: DC
Start: 2022-05-25 — End: 2022-08-25

## 2022-05-25 MED ORDER — OMEPRAZOLE 40 MG CAPSULE,DELAYED RELEASE
40.0000 mg | DELAYED_RELEASE_CAPSULE | Freq: Two times a day (BID) | ORAL | 3 refills | Status: DC
Start: 2022-05-25 — End: 2022-08-25

## 2022-05-25 MED ORDER — CHOLECALCIFEROL (VITAMIN D3) 1,250 MCG (50,000 UNIT) CAPSULE
50000.0000 [IU] | ORAL_CAPSULE | ORAL | 3 refills | Status: DC
Start: 2022-05-25 — End: 2022-08-25

## 2022-05-25 MED ORDER — LINACLOTIDE 72 MCG CAPSULE
72.0000 ug | ORAL_CAPSULE | Freq: Every morning | ORAL | 3 refills | Status: DC
Start: 2022-05-25 — End: 2022-08-25

## 2022-05-25 MED ORDER — TRAZODONE 300 MG TABLET
300.0000 mg | ORAL_TABLET | Freq: Every day | ORAL | 3 refills | Status: DC
Start: 2022-05-25 — End: 2022-08-25

## 2022-05-25 MED ORDER — CETIRIZINE 10 MG TABLET
10.0000 mg | ORAL_TABLET | Freq: Every day | ORAL | 3 refills | Status: DC
Start: 2022-05-25 — End: 2022-08-25

## 2022-05-25 MED ORDER — KETOROLAC 60 MG/2 ML INTRAMUSCULAR SOLUTION
60.0000 mg | Freq: Once | INTRAMUSCULAR | Status: AC
Start: 2022-05-25 — End: 2022-05-25
  Administered 2022-05-25: 60 mg via INTRAMUSCULAR

## 2022-05-25 MED ORDER — METOCLOPRAMIDE 5 MG TABLET
5.0000 mg | ORAL_TABLET | Freq: Every day | ORAL | 3 refills | Status: DC
Start: 2022-05-25 — End: 2022-08-25

## 2022-05-25 MED ORDER — CYCLOBENZAPRINE 10 MG TABLET
10.0000 mg | ORAL_TABLET | Freq: Two times a day (BID) | ORAL | 3 refills | Status: DC
Start: 2022-05-25 — End: 2022-08-25

## 2022-05-25 MED ORDER — ROSUVASTATIN 5 MG TABLET
5.0000 mg | ORAL_TABLET | Freq: Every day | ORAL | 3 refills | Status: DC
Start: 2022-05-25 — End: 2022-08-25

## 2022-05-25 MED ORDER — BETHANECHOL CHLORIDE 5 MG TABLET
5.0000 mg | ORAL_TABLET | Freq: Two times a day (BID) | ORAL | 3 refills | Status: DC
Start: 2022-05-25 — End: 2022-08-25

## 2022-05-25 MED ORDER — TOPIRAMATE 100 MG TABLET
100.0000 mg | ORAL_TABLET | Freq: Two times a day (BID) | ORAL | 3 refills | Status: DC
Start: 2022-05-25 — End: 2022-08-25

## 2022-05-25 NOTE — Progress Notes (Signed)
FAMILY MEDICINE, [place]  [place] [address]  Operated by [facility]     Name: Lindsay Crawford MRN:  [id number]   Date of Birth: November 30, 1975 Age: 46 y.o.   Date: 05/25/2022  Time: 10:55     Provider: Feliberto Harts, DO      Assessment/Plan:    1. DUB (dysfunctional uterine bleeding)  She would be a good candidate for uterine ablation. Will refer to another GYN  - CBC/DIFF; Standing  - Refer to [place]; Future    2. Gastroparesis  She follows with GI. Keep appt     3. BMI 36.0-36.9,adult  Discussed diet/exercise     4. Hyperlipidemia LDL goal <70  Continue Crestor. Will get lab.  - LIPID PANEL; Standing  - THYROID STIMULATING HORMONE (SENSITIVE TSH); Standing  - MAGNESIUM; Standing  - VITAMIN B12; Future    5. Insomnia, unspecified type  Doing well with trazodone    6. Vitamin D deficiency    - VITAMIN D 25 TOTAL; Future    7. Paraspinal muscle spasm    8. Chronic migraine without aura without status migrainosus, not intractable  Continue topamax    9. Gastroesophageal reflux disease, unspecified whether esophagitis present  Follows with GI. Continue meds    10. Borderline diabetes  Recheck lab  - COMPREHENSIVE METABOLIC PANEL, NON-FASTING; Standing  - HGA1C (HEMOGLOBIN A1C WITH EST AVG GLUCOSE); Future    11. Iron deficiency  Will recheck iron levels  - IRON TRANSFERRIN AND TIBC; Future       No follow-ups on file.      Orders Placed This Encounter   . CBC/DIFF   . COMPREHENSIVE METABOLIC PANEL, NON-FASTING   . HGA1C (HEMOGLOBIN A1C WITH EST AVG GLUCOSE)   . LIPID PANEL   . THYROID STIMULATING HORMONE (SENSITIVE TSH)   . MAGNESIUM   . VITAMIN B12   . VITAMIN D 25 TOTAL   . IRON TRANSFERRIN AND TIBC   . Refer to [facility] [place] [facility]   . linaCLOtide (LINZESS) 72 mcg Oral Capsule   . bethanechol chloride (URECHOLINE) 5 mg Oral Tablet   . cetirizine (ZYRTEC) 10 mg Oral Tablet   . cholecalciferol, vitamin  D3, 1,250 mcg (50,000 unit) Oral Capsule   . cyclobenzaprine (FLEXERIL) 10 mg Oral Tablet   . famotidine (PEPCID) 40 mg Oral Tablet   . metoclopramide HCl (REGLAN) 5 mg Oral Tablet   . omeprazole (PRILOSEC) 40 mg Oral Capsule, Delayed Release(E.C.)   . rosuvastatin (CRESTOR) 5 mg Oral Tablet   . topiramate (TOPAMAX) 100 mg Oral Tablet   . TraZODone (DESYREL) 300 mg Oral Tablet             Discontinued Medications    LINACLOTIDE (LINZESS) 145 MCG ORAL CAPSULE    Take 1 Capsule (145 mcg total) by mouth Every morning for 90 days    MELOXICAM (MOBIC) 7.5 MG ORAL TABLET    Take 1 Tablet (7.5 mg total) by mouth Once a day for 90 days      Reason for visit: Follow Up 6 Months (Having pain in the hip left worse than r/Feels like fluid in ears)      History of Present Illness:  Lindsay Crawford is a 46 y.o. female with gastroparesis, GERD, obesity, borderline diabetes, chronic low back pain and DUB presents for six month chronic disease management.    She has been to see Dr. Galvin Proffer regarding  bilateral knee and left hip pain.  She has had steroid injections.  She was dx with bursitis in her hip.    She went to see a GYN. She said they told her good luck with her heavy menses and offered no solutions.      Historical Data       Past Medical History:       Past Medical History:   Diagnosis Date   . Body mass index 35.0-35.9, adult    . Borderline diabetes    . Chronic low back pain    . Dizziness    . DUB (dysfunctional uterine bleeding)    . Gastroparesis    . GERD (gastroesophageal reflux disease)    . Hyperglycemia    . Hyperlipidemia LDL goal <70    . Insomnia    . Knee pain, bilateral    . Migraine    . Paraspinal muscle spasm    . Vitamin D deficiency          Past Surgical History:        Past Surgical History:   Procedure Laterality Date   . CESAREAN SECTION  2004   . COLONOSCOPY      Dr. Lilia Pro in [place]   . ESOPHAGOSCOPY / EGD     . HX APPENDECTOMY     . HX BACK SURGERY     . HX LAP  CHOLECYSTECTOMY           Allergies:        Allergies   Allergen Reactions   . Other Rash     Allergic to the sun      Medications:  medroxyPROGESTERone (PROVERA) 10 mg Oral Tablet, Take 1 Tablet (10 mg total) by mouth Twice daily  polyethylene glycol (MIRALAX) 17 gram/dose Oral Powder, Take 1 g by mouth Once a day  bethanechol chloride (URECHOLINE) 5 mg Oral Tablet, Take 1 Tablet (5 mg total) by mouth Twice daily for 90 days  cetirizine (ZYRTEC) 10 mg Oral Tablet, Take 1 Tablet (10 mg total) by mouth Once a day for 90 days  cholecalciferol, vitamin D3, 1,250 mcg (50,000 unit) Oral Capsule, Take 1 Capsule (50,000 Units total) by mouth Every 7 days for 90 days  cyclobenzaprine (FLEXERIL) 10 mg Oral Tablet, Take 1 Tablet (10 mg total) by mouth Twice daily for 90 days  famotidine (PEPCID) 40 mg Oral Tablet, Take 1 Tablet (40 mg total) by mouth Once a day for 90 days  linaCLOtide (LINZESS) 145 mcg Oral Capsule, Take 1 Capsule (145 mcg total) by mouth Every morning for 90 days  meloxicam (MOBIC) 7.5 mg Oral Tablet, Take 1 Tablet (7.5 mg total) by mouth Once a day for 90 days  metoclopramide HCl (REGLAN) 5 mg Oral Tablet, Take 1 Tablet (5 mg total) by mouth Twice daily for 90 days  omeprazole (PRILOSEC) 40 mg Oral Capsule, Delayed Release(E.C.), Take 1 Capsule (40 mg total) by mouth Once a day for 90 days  rosuvastatin (CRESTOR) 5 mg Oral Tablet, Take 1 Tablet (5 mg total) by mouth Once a day for 90 days  topiramate (TOPAMAX) 100 mg Oral Tablet, Take 1 Tablet (100 mg total) by mouth Twice daily for 90 days  TraZODone (DESYREL) 300 mg Oral Tablet, Take 1 Tablet (300 mg total) by mouth Once a day for 30 days    No facility-administered medications prior to visit.     Family History:       Family Medical History:  Problem Relation (Age of Onset)    COPD Father    Coronary Artery Disease Brother    Diabetes type II Father    Hypertension (High Blood Pressure) Mother, Father    Kidney Disease Father     Throat cancer Sister          Social History:  Social History           Socioeconomic History   . Marital status: Widowed   . Number of children: 1   . Years of education: 12+   . Highest education level: Associate degree: occupational, Hotel manager, or vocational program   Tobacco Use   . Smoking status: Never   . Smokeless tobacco: Never   Vaping Use   . Vaping Use: Never used   Substance and Sexual Activity   . Alcohol use: Never   . Drug use: Never   . Sexual activity: Yes     Partners: Male     Birth control/protection: None            Review of Systems:  Any pertinent Review of Systems as addressed in the HPI above.    Physical Exam:  Vital Signs:      Vitals:    05/25/22 0954   BP: 110/70   Pulse: 55   SpO2: 96%   Weight: 110 kg (242 lb)   Height: 1.727 m ('5\' 8"'$ )   BMI: 36.87     Physical Exam  Vitals reviewed.   Constitutional:       Appearance: She is obese.   HENT:      Head: Normocephalic and atraumatic.      Right Ear: Tympanic membrane normal. There is no impacted cerumen.      Left Ear: Tympanic membrane normal. There is no impacted cerumen.      Nose: Nose normal.   Neck:      Vascular: No carotid bruit.   Cardiovascular:      Rate and Rhythm: Normal rate and regular rhythm.      Pulses: Normal pulses.      Heart sounds: Normal heart sounds.   Pulmonary:      Effort: Pulmonary effort is normal.      Breath sounds: Normal breath sounds. No wheezing, rhonchi or rales.   Musculoskeletal:      Right lower leg: No edema.      Left lower leg: No edema.   Lymphadenopathy:      Cervical: No cervical adenopathy.   Skin:     General: Skin is warm and dry.      Capillary Refill: Capillary refill takes less than 2 seconds.   Neurological:      General: No focal deficit present.      Mental Status: She is alert and oriented to person, place, and time.           Feliberto Harts, DO     Portions of this note may be dictated using voice recognition software or a dictation service. Variances in spelling  and vocabulary are possible and unintentional. Not all errors are caught/corrected. Please notify the [facility] if any discrepancies are noted or if the meaning of any statement is not clear.

## 2022-05-26 LAB — HGA1C (HEMOGLOBIN A1C WITH EST AVG GLUCOSE): HEMOGLOBIN A1C: 5.4 % (ref 4.0–6.0)

## 2022-06-04 NOTE — Result Encounter Note (Signed)
Message left again for patient to call for results/br

## 2022-06-07 ENCOUNTER — Telehealth (RURAL_HEALTH_CENTER): Payer: Self-pay | Admitting: Family Medicine

## 2022-06-07 NOTE — Telephone Encounter (Signed)
Message left for patient to call back for results/br

## 2022-06-07 NOTE — Telephone Encounter (Signed)
-----   Message from [place], [place] sent at 06/03/2022  1:01 PM EDT -----  B12 is ok    Cholesterol is a little higher. Continue Crestor. Make sure to take at night and try some lifestyle changes to consume leaner proteins/vegetables    Iron levels are normal. Should NOT need supplement    Can hold vitamin d as levels are now corrected

## 2022-06-08 NOTE — Telephone Encounter (Signed)
I have called her several times it goes straight to voice mail please advise can we send her a letter or copy of labs since I have not heard back from her/br

## 2022-06-10 ENCOUNTER — Telehealth (RURAL_HEALTH_CENTER): Payer: Self-pay | Admitting: Family Medicine

## 2022-06-10 NOTE — Telephone Encounter (Signed)
-----   Message from [place], [place] sent at 06/03/2022  1:01 PM EDT -----  B12 is ok    Cholesterol is a little higher. Continue Crestor. Make sure to take at night and try some lifestyle changes to consume leaner proteins/vegetables    Iron levels are normal. Should NOT need supplement    Can hold vitamin d as levels are now corrected

## 2022-06-10 NOTE — Telephone Encounter (Signed)
Have tried several times to get patient goes straight to voice mail can we send letter?/br

## 2022-06-10 NOTE — Result Encounter Note (Signed)
Have called several times however unable to reach can you do letter?/br

## 2022-06-16 IMAGING — MR MRI KNEE RT W/O CONTRAST
4 of 5 series · 28 of 40 positions shown · IV contrast (gadolinium)
Comparison: None available.

﻿EXAM:  76789   MRI KNEE RT W/O CONTRAST
INDICATION: 45-year-old with chronic right knee pain and diminished range of motion.  No history of trauma or knee surgery.
TECHNIQUE: Multiplanar, multisequential MRI of the right knee was performed without gadolinium contrast.

[Series 5: PD fat-sat · axial · right · 4.0mm · 0.53mm/px · z∈[-98,+33]mm · 8 of 30 slices shown (1 of 3)]
[im 1/30]
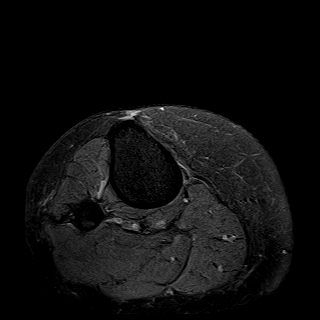
[im 5/30]
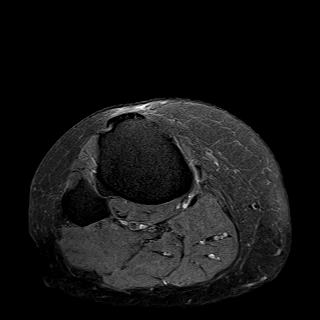
[im 9/30]
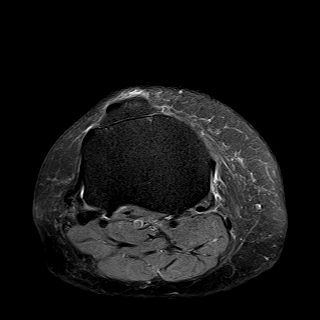
[im 13/30]
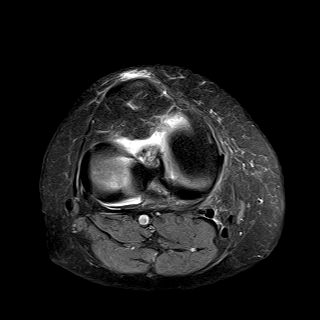
[im 17/30]
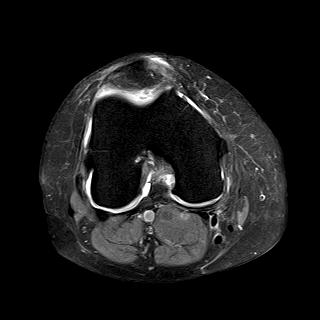
[im 21/30]
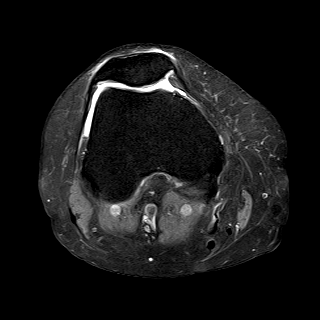
[im 25/30]
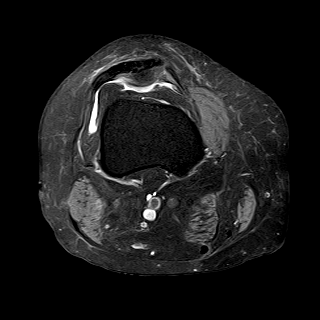
[im 30/30]
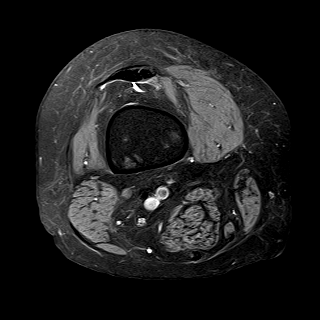

[Series 6: PD fat-sat · sagittal · right · 3.0mm · 0.29mm/px · 8 of 30 slices shown (2 of 3)]
[im 1/30]
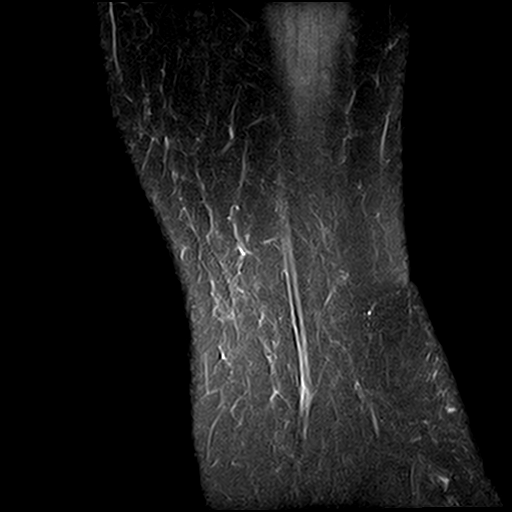
[im 5/30]
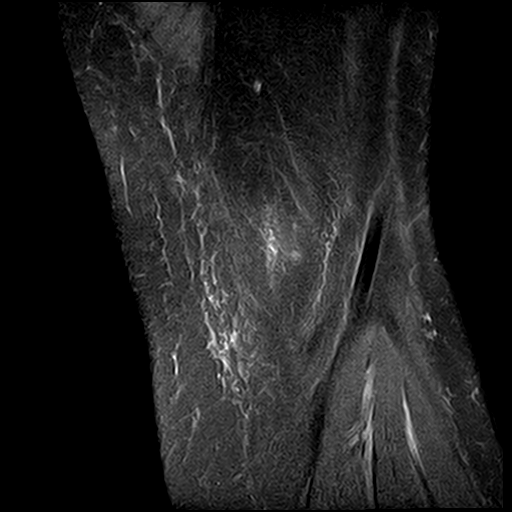
[im 9/30]
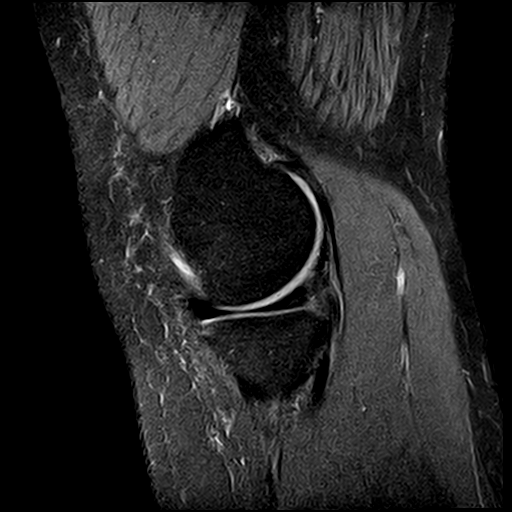
[im 13/30]
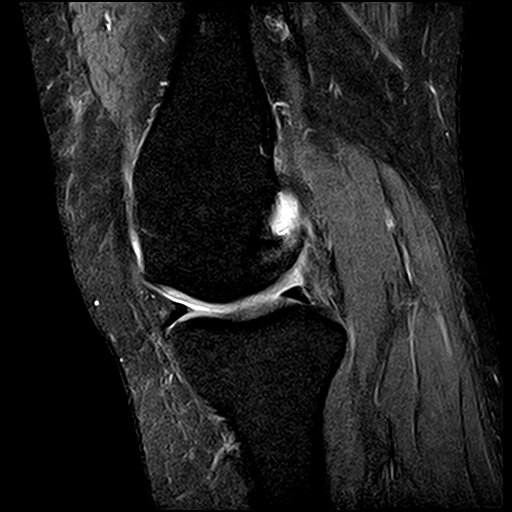
[im 17/30]
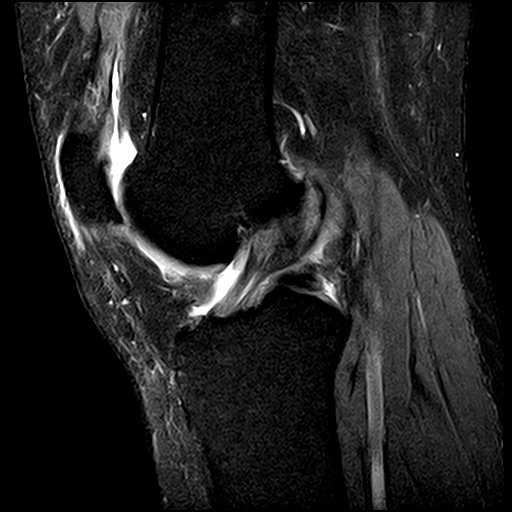
[im 21/30]
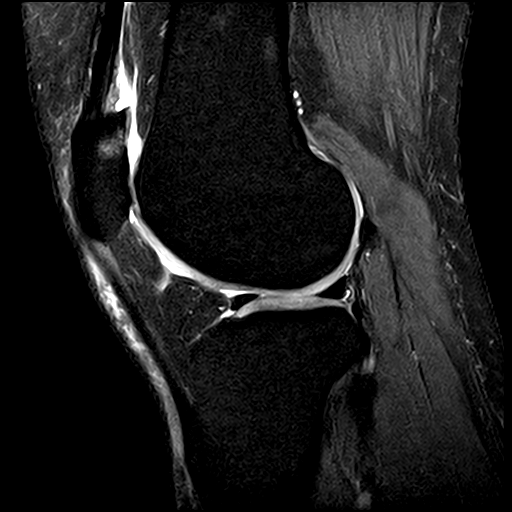
[im 25/30]
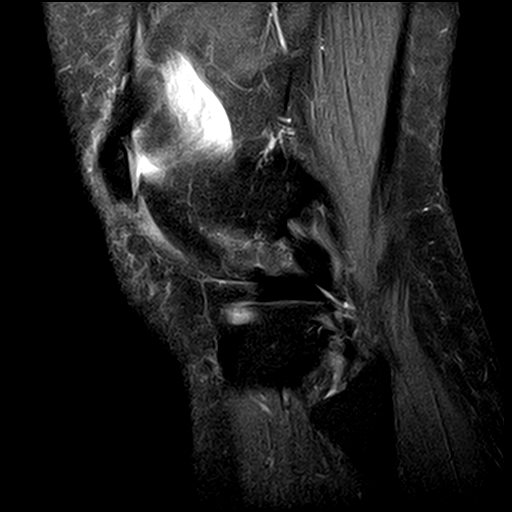
[im 30/30]
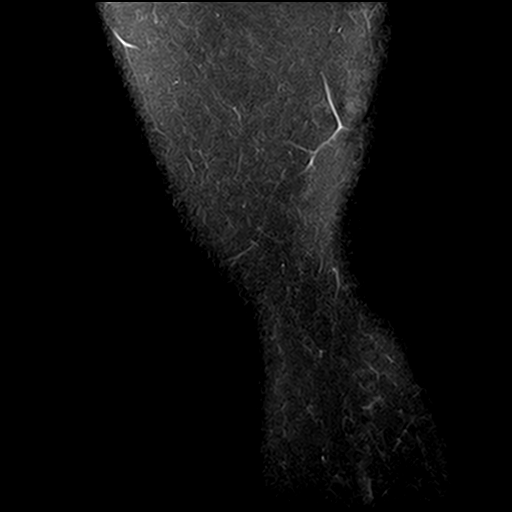

[Series 7: T1 · sagittal · right · 3.0mm · 0.29mm/px · 4 of 30 slices shown]
[im 1/30]
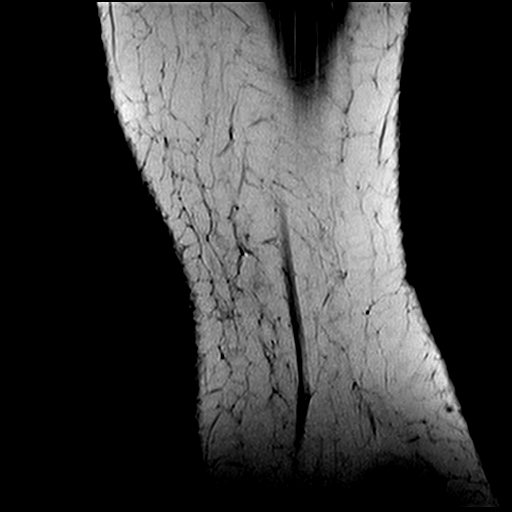
[im 5/30]
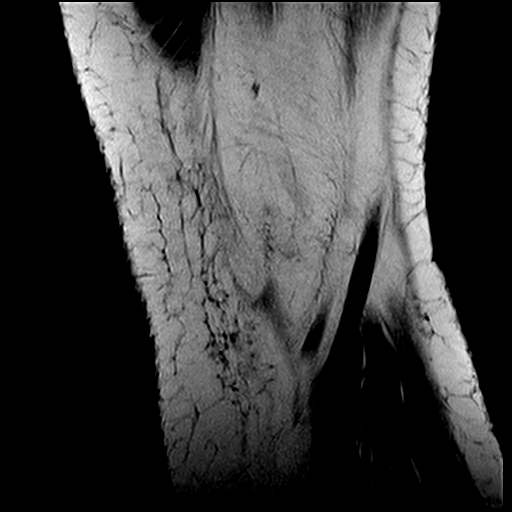
[im 17/30]
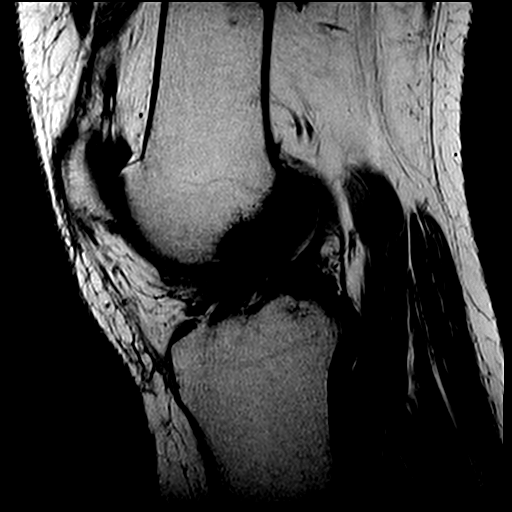
[im 25/30]
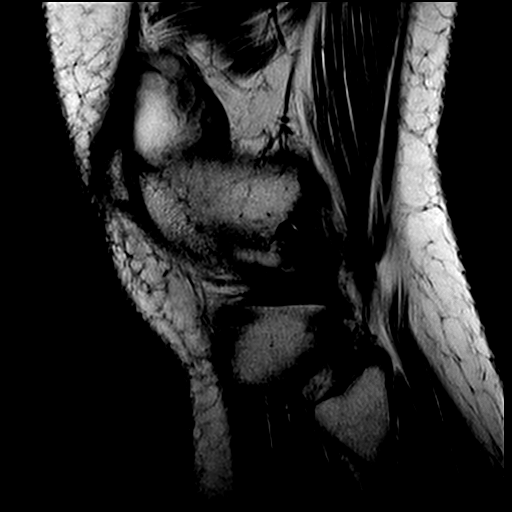

[Series 11: PD fat-sat · coronal · right · 3.0mm · 0.47mm/px · 8 of 29 slices shown (3 of 3)]
[im 1/29]
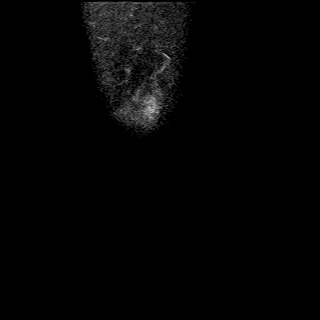
[im 5/29]
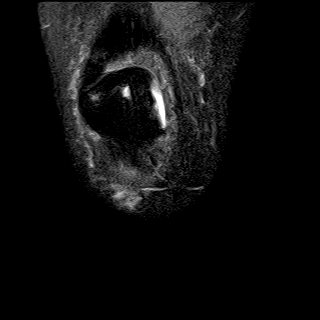
[im 9/29]
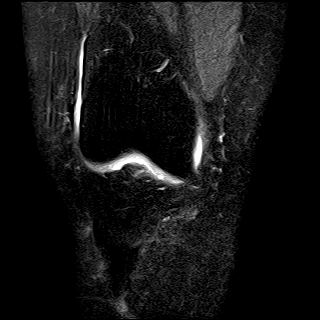
[im 13/29]
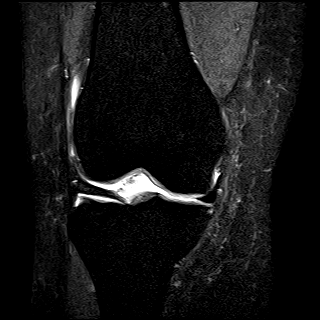
[im 17/29]
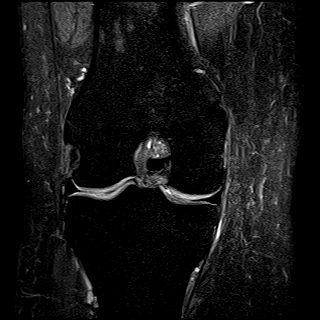
[im 21/29]
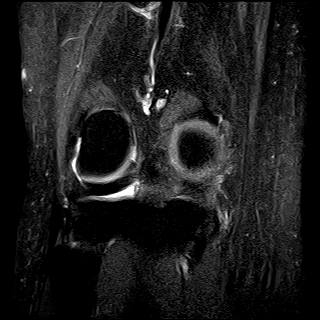
[im 25/29]
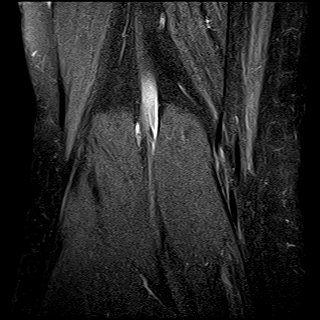
[im 29/29]
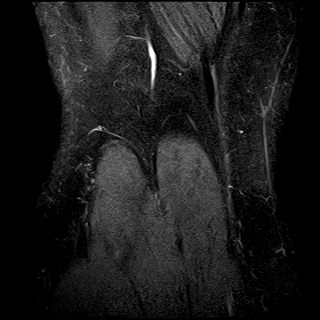

[28 of 40 positions shown; findings below may reference images not displayed]

FINDINGS: No acute bony lesions are seen at the right knee.

Medial meniscus and lateral meniscus are intact.  Mild grade 1 degenerative changes of the lateral articular cartilage are noted. 

Cruciate ligaments and the collateral ligaments are intact. 

Mild patella alta is noted.  Significant, grade 4 chondromalacia and degenerative changes of patellar articular cartilage are noted particularly in the lateral facet with small subchondral cyst formation.  Small effusion is noted in the knee joint.  No focal abnormalities of the popliteal fossa.
IMPRESSION: 1. No acute bone changes. 

2. Mild patella alta.

3. Significant, grade 4 chondromalacia and degenerative changes of patellar articular cartilage, particularly of the lateral facet with small subchondral cyst formation. 

4. Small effusion in the knee joint.

## 2022-06-23 ENCOUNTER — Ambulatory Visit (INDEPENDENT_AMBULATORY_CARE_PROVIDER_SITE_OTHER): Payer: Medicaid Other | Admitting: OBSTETRICS/GYNECOLOGY

## 2022-06-23 ENCOUNTER — Other Ambulatory Visit (INDEPENDENT_AMBULATORY_CARE_PROVIDER_SITE_OTHER): Payer: Medicaid Other

## 2022-06-23 ENCOUNTER — Other Ambulatory Visit: Payer: Self-pay

## 2022-06-23 ENCOUNTER — Encounter (INDEPENDENT_AMBULATORY_CARE_PROVIDER_SITE_OTHER): Payer: Self-pay | Admitting: OBSTETRICS/GYNECOLOGY

## 2022-06-23 VITALS — BP 124/83 | HR 82 | Ht 68.0 in | Wt 240.0 lb

## 2022-06-23 DIAGNOSIS — R102 Pelvic and perineal pain: Secondary | ICD-10-CM

## 2022-06-23 DIAGNOSIS — N926 Irregular menstruation, unspecified: Secondary | ICD-10-CM

## 2022-06-23 DIAGNOSIS — N921 Excessive and frequent menstruation with irregular cycle: Secondary | ICD-10-CM

## 2022-06-23 DIAGNOSIS — R5383 Other fatigue: Secondary | ICD-10-CM

## 2022-06-23 NOTE — Patient Instructions (Signed)
She will be scheduled for diagnostic laparoscopy bilateral salpingectomy hysteroscopy with D&C possible NovaSure ablation with the 1st to September

## 2022-06-23 NOTE — Progress Notes (Signed)
OB/[facility]N, [place]  [address]  [place] [address]       Name: Lindsay Crawford MRN:  [id number]   Date: 06/23/2022 Age: 46 y.o.       HPI  Lindsay Crawford presents today with   Chief Complaint   Patient presents with   . Irregular Bleeding   . Pelvic Pain   . Sterilization      Nursing Notes:   Colon Flattery, LPN  83/38/25 0539  Sign at exiting of workspace  C/o periods are irregular, having 2 periods a month at times,  Bleeding is lasting 5-7 days,   Bleeding is heavy with clots and gushes, cramping pain scale 8/10,  no pain with intercourse,  also having migraines with periods,      Also wants to discuss Tubal    Colon Flattery, LPN       46 year old married white female gravida 1 para 1001 last regular 24th July 2023 birth control none who presents with complaint of heavy irregular periods last 5-7 days large clots gush cramping 8 on a scale of 10 pain with intercourse she would like to have her tubes tied and consider endometrial ablation she is migraines with her headaches last Pap smear was last year in [place] she is a 2 periods a month 1 the 1st of July for 4 days when the 24th of July    REVIEW OF SYSTEMS :  Pertinent ROS as per HPI    Patient's PMH/PSH/FH/SH/Meds/Allergies were all reviewed and noted in the EPIC chart on today's visit.    Objective:  BP 124/83   Pulse 82   Ht 1.727 m ('5\' 8"'$ )   Wt 109 kg (240 lb)   LMP 06/21/2022 Comment: having 2 periods a month  BMI 36.49 kg/m         Physical Exam:    Constitutional: She is oriented. She appears well-developed and well-nourished.     Abdomen: She exhibits no distension. Soft. No tenderness. She has no rebound and no guarding.     Psychiatric: She has a normal mood and affect. Her behavior is normal.     Skin:  Normal, no rashes or skin lesions.    GU:  External Exam: External exam normal.    No urethral tenderness.  No genital lesions present.    Vaginal Exam:  Dark blood   Bladder:  Bladder is normal to exam.  Suprapubic tenderness not present.    Uterus: Uterus is normal to exam.  Cervix is normal to exam.   multiparous multiple nabothian cysts  Contour:  Regular.      Adnexa:  No palpable masses and no tenderness.        Assessment     ICD-10-CM    1. Irregular periods/menstrual cycles  N92.6 US PELVIS NON OB TRANSVAGINAL     76830 - ULTRASOUND, TRANSVAGINAL (AMB ONLY)     CBC/DIFF     C-REACTIVE PROTEIN(CRP),INFLAMMATION      2. Menorrhagia with irregular cycle  N92.1 US PELVIS NON OB TRANSVAGINAL     76830 - ULTRASOUND, TRANSVAGINAL (AMB ONLY)     CBC/DIFF     C-REACTIVE PROTEIN(CRP),INFLAMMATION      3. Pelvic pain  R10.2 CBC/DIFF     C-REACTIVE PROTEIN(CRP),INFLAMMATION      4. Fatigue, unspecified type  R53.83 CBC/DIFF     C-REACTIVE PROTEIN(CRP),INFLAMMATION           Plan:   Transvaginal ultrasound was performed as described ordered  CBC CRP she is scheduled for diagnostic laparoscopy bilateral salpingectomy hysteroscopy with D&C with NovaSure ablation follow up in the 15th of August for preop appointment surgery scheduled for the 18th of August when he copy of her Pap smear from [facility] This Encounter   . 71696 - ULTRASOUND, TRANSVAGINAL (AMB ONLY)   . US PELVIS NON OB TRANSVAGINAL   . CBC/DIFF   . C-REACTIVE PROTEIN(CRP),INFLAMMATION       No follow-ups on file.    Conley Rolls, DO  06/23/2022, 14:23    This note was partially generated using MModal Fluency Direct system, and there may be some incorrect words, spellings, and punctuation that were not noted in checking the note before saving.

## 2022-06-23 NOTE — Nursing Note (Signed)
C/o periods are irregular, having 2 periods a month at times,  Bleeding is lasting 5-7 days,   Bleeding is heavy with clots and gushes, cramping pain scale 8/10,  no pain with intercourse,  also having migraines with periods,      Also wants to discuss Tubal    Colon Flattery, LPN

## 2022-07-27 ENCOUNTER — Ambulatory Visit (INDEPENDENT_AMBULATORY_CARE_PROVIDER_SITE_OTHER): Payer: Medicaid Other | Admitting: OBSTETRICS/GYNECOLOGY

## 2022-07-27 ENCOUNTER — Other Ambulatory Visit: Payer: Medicaid Other | Attending: OBSTETRICS/GYNECOLOGY

## 2022-07-27 ENCOUNTER — Other Ambulatory Visit: Payer: Self-pay

## 2022-07-27 ENCOUNTER — Encounter (INDEPENDENT_AMBULATORY_CARE_PROVIDER_SITE_OTHER): Payer: Self-pay | Admitting: OBSTETRICS/GYNECOLOGY

## 2022-07-27 VITALS — BP 116/74 | HR 75 | Ht 68.0 in | Wt 243.0 lb

## 2022-07-27 DIAGNOSIS — Z01818 Encounter for other preprocedural examination: Secondary | ICD-10-CM

## 2022-07-27 DIAGNOSIS — R102 Pelvic and perineal pain: Secondary | ICD-10-CM | POA: Insufficient documentation

## 2022-07-27 DIAGNOSIS — N946 Dysmenorrhea, unspecified: Secondary | ICD-10-CM

## 2022-07-27 DIAGNOSIS — N921 Excessive and frequent menstruation with irregular cycle: Secondary | ICD-10-CM

## 2022-07-27 LAB — URINALYSIS, MACROSCOPIC
BILIRUBIN: NEGATIVE mg/dL
BLOOD: NEGATIVE mg/dL
GLUCOSE: NEGATIVE mg/dL
KETONES: NEGATIVE mg/dL
LEUKOCYTES: 250 WBCs/uL — AB
PH: 5 (ref 5.0–9.0)
PROTEIN: NEGATIVE mg/dL
SPECIFIC GRAVITY: 1.021 (ref 1.002–1.030)
UROBILINOGEN: NORMAL mg/dL

## 2022-07-27 LAB — CBC
HCT: 38.7 % (ref 37.0–47.0)
HGB: 13.4 g/dL (ref 12.5–16.0)
MCH: 29.9 pg (ref 27.0–32.0)
MCHC: 34.6 g/dL (ref 32.0–36.0)
MCV: 86.4 fL (ref 78.0–99.0)
MPV: 7.8 fL (ref 7.4–10.4)
PLATELETS: 264 10*3/uL (ref 140–440)
RBC: 4.48 10*6/uL (ref 4.20–5.40)
RDW: 12.8 % (ref 11.6–14.8)
WBC: 11.6 10*3/uL — ABNORMAL HIGH (ref 4.0–10.5)
WBCS UNCORRECTED: 11.6 10*3/uL

## 2022-07-27 LAB — URINALYSIS, MICROSCOPIC
RBCS: 4 /hpf — ABNORMAL HIGH (ref <4)
SQUAMOUS EPITHELIAL: 11 /hpf (ref <28)
WBCS: 6 /hpf — ABNORMAL HIGH (ref <6)

## 2022-07-27 LAB — HCG, URINE QUALITATIVE, PREGNANCY: HCG URINE QUALITATIVE: NEGATIVE

## 2022-07-27 NOTE — Nursing Note (Signed)
Pre op dx laparoscopy hysteroscopy d&C bilateral salpingectomy,  consent signed,    Colon Flattery, LPN

## 2022-07-27 NOTE — H&P (Signed)
Preoperative Visit History and Physical      DATE OF SERVICE: 07/27/2022, 11:28   PATIENT: Lindsay Crawford  DOB: 03-29-1976   CHART NUMBER: [id number]      HPI  This is a 46 y.o. female who presents for pre-op visit for  for chronic pelvic pain dysmenorrhea menometrorrhagia        Patient's last menstrual period was 07/21/2022.   No obstetric history on file.     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    Knee pain, right    Obesity (BMI 35.0-39.9 without comorbidity)    Borderline diabetes    Irritable bowel syndrome with constipation    DUB (dysfunctional uterine bleeding)    Gastroparesis    Hyperlipidemia LDL goal <70    Insomnia    Vitamin D deficiency    Paraspinal muscle spasm    Migraine    GERD (gastroesophageal reflux disease)       Past Medical History:   Diagnosis Date    Body mass index 35.0-35.9, adult     Borderline diabetes     Chronic low back pain     Dizziness     DUB (dysfunctional uterine bleeding)     Gastroparesis     GERD (gastroesophageal reflux disease)     Hyperglycemia     Hyperlipidemia LDL goal <70     Insomnia     Knee pain, bilateral     Migraine     Paraspinal muscle spasm     Vitamin D deficiency            Past Surgical History:   Procedure Laterality Date    CESAREAN SECTION  2004    COLONOSCOPY      Dr. Lilia Pro in [place] / EGD      HX APPENDECTOMY      HX BACK SURGERY      HX LAP CHOLECYSTECTOMY             Social History     Tobacco Use    Smoking status: Never    Smokeless tobacco: Never   Vaping Use    Vaping Use: Never used   Substance Use Topics    Alcohol use: Never    Drug use: Never       Allergies   Allergen Reactions    Other Rash     Allergic to the sun         Physical Exam  Vitals:    07/27/22 1028   BP: 116/74   Pulse: 75   Weight: 110 kg (243 lb)   Height: 1.727 m ('5\' 8"'$ )   BMI: 37.03           General:  NAD, well-appearing  HEENT:  Atraumatic, normocephalic, MMM  Resp:  CTAB  CV:  RRR,  no m/r/g  Abdo:  NTND  Ext:  No cyanosis or edema  Neuro:  CN II-XII grossly intact  Skin:  No apparent rashes or lesions  Vulvovaginal is clear cervix multiparous uterus top-normal size tender adnexa is bilaterally tender    A/P: Lindsay Crawford is a 46 y.o. who presents today for chronic pelvic pain dysmenorrhea menometrorrhagia    1. Pre-operative Visit   - Plan for diagnostic laparoscopy hysteroscopy D&C possible NovaSure endometrial ablation due to chronic pelvic pain dysmenorrhea menometrorrhagia  - Surgical consent reviewed and signed with patient.   - All risks, benefits,  and alternatives were discussed.  The patient wishes to proceed.  Pre-op labs and DOS orders placed.  NPO post midnight discussed the night before surgery, no ASA, NSAIDS, or herbal drugs between now and her surgery.  - Continues to be appropriate surgical candidate  - Medications to take on AM of surgery:  Per anesthesia    - Post-operative instructions reviewed.    Conley Rolls, DO  07/27/2022, 11:28

## 2022-07-30 ENCOUNTER — Inpatient Hospital Stay
Admission: RE | Admit: 2022-07-30 | Discharge: 2022-07-30 | Disposition: A | Discharge status: Home / Self Care | Payer: MEDICAID | Source: Ambulatory Visit | Attending: OBSTETRICS/GYNECOLOGY | Admitting: OBSTETRICS/GYNECOLOGY

## 2022-07-30 ENCOUNTER — Ambulatory Visit (HOSPITAL_COMMUNITY): Payer: MEDICAID | Admitting: Anesthesiology

## 2022-07-30 ENCOUNTER — Encounter (HOSPITAL_COMMUNITY)
Admission: RE | Disposition: A | Discharge status: Home / Self Care | Payer: Self-pay | Source: Ambulatory Visit | Attending: OBSTETRICS/GYNECOLOGY

## 2022-07-30 ENCOUNTER — Other Ambulatory Visit: Payer: Self-pay

## 2022-07-30 ENCOUNTER — Encounter (HOSPITAL_COMMUNITY): Payer: Self-pay | Admitting: OBSTETRICS/GYNECOLOGY

## 2022-07-30 DIAGNOSIS — D252 Subserosal leiomyoma of uterus: Secondary | ICD-10-CM | POA: Insufficient documentation

## 2022-07-30 DIAGNOSIS — N921 Excessive and frequent menstruation with irregular cycle: Secondary | ICD-10-CM | POA: Insufficient documentation

## 2022-07-30 DIAGNOSIS — G8929 Other chronic pain: Secondary | ICD-10-CM | POA: Insufficient documentation

## 2022-07-30 DIAGNOSIS — N858 Other specified noninflammatory disorders of uterus: Secondary | ICD-10-CM | POA: Insufficient documentation

## 2022-07-30 DIAGNOSIS — R102 Pelvic and perineal pain: Secondary | ICD-10-CM | POA: Insufficient documentation

## 2022-07-30 DIAGNOSIS — N946 Dysmenorrhea, unspecified: Secondary | ICD-10-CM | POA: Insufficient documentation

## 2022-07-30 DIAGNOSIS — N736 Female pelvic peritoneal adhesions (postinfective): Secondary | ICD-10-CM | POA: Insufficient documentation

## 2022-07-30 DIAGNOSIS — Z302 Encounter for sterilization: Secondary | ICD-10-CM | POA: Insufficient documentation

## 2022-07-30 HISTORY — PX: LAPAROSCOPIC TUBAL LIGATION: SUR803

## 2022-07-30 SURGERY — LAPAROSCOPIC TUBAL LIGATION BILATERAL
Anesthesia: General | Site: Vagina | Wound class: Clean Wound: Uninfected operative wounds in which no inflammation occurred

## 2022-07-30 MED ORDER — MORPHINE 4 MG/ML INJECTION WRAPPER
4.0000 mg | INJECTION | INTRAMUSCULAR | Status: DC | PRN
Start: 2022-07-30 — End: 2022-07-30

## 2022-07-30 MED ORDER — FENTANYL (PF) 50 MCG/ML INJECTION WRAPPER
25.0000 ug | INJECTION | INTRAMUSCULAR | Status: DC | PRN
Start: 2022-07-30 — End: 2022-07-30

## 2022-07-30 MED ORDER — SODIUM CHLORIDE 0.9 % (FLUSH) INJECTION SYRINGE
3.0000 mL | INJECTION | INTRAMUSCULAR | Status: DC | PRN
Start: 2022-07-30 — End: 2022-07-30

## 2022-07-30 MED ORDER — SUGAMMADEX 100 MG/ML INTRAVENOUS SOLUTION
Freq: Once | INTRAVENOUS | Status: DC | PRN
Start: 2022-07-30 — End: 2022-07-30
  Administered 2022-07-30: 500 mg via INTRAVENOUS

## 2022-07-30 MED ORDER — SODIUM CHLORIDE 0.9 % (FLUSH) INJECTION SYRINGE
3.0000 mL | INJECTION | Freq: Three times a day (TID) | INTRAMUSCULAR | Status: DC
Start: 2022-07-30 — End: 2022-07-30

## 2022-07-30 MED ORDER — ROCURONIUM 10 MG/ML INTRAVENOUS SOLUTION
Freq: Once | INTRAVENOUS | Status: DC | PRN
Start: 2022-07-30 — End: 2022-07-30
  Administered 2022-07-30: 40 mg via INTRAVENOUS

## 2022-07-30 MED ORDER — LACTATED RINGERS INTRAVENOUS SOLUTION
INTRAVENOUS | Status: DC
Start: 2022-07-30 — End: 2022-07-30

## 2022-07-30 MED ORDER — LIDOCAINE HCL 10 MG/ML (1 %) INJECTION SOLUTION
Freq: Once | INTRAMUSCULAR | Status: DC | PRN
Start: 2022-07-30 — End: 2022-07-30
  Administered 2022-07-30: 20 mL via INTRADERMAL

## 2022-07-30 MED ORDER — FAMOTIDINE (PF) 20 MG/2 ML INTRAVENOUS SOLUTION
20.0000 mg | Freq: Once | INTRAVENOUS | Status: AC
Start: 2022-07-30 — End: 2022-07-30
  Administered 2022-07-30: 20 mg via INTRAVENOUS

## 2022-07-30 MED ORDER — HYDROCODONE 5 MG-ACETAMINOPHEN 325 MG TABLET
2.0000 | ORAL_TABLET | ORAL | Status: DC | PRN
Start: 2022-07-30 — End: 2022-07-30
  Administered 2022-07-30: 2 via ORAL
  Filled 2022-07-30: qty 2

## 2022-07-30 MED ORDER — IPRATROPIUM 0.5 MG-ALBUTEROL 3 MG (2.5 MG BASE)/3 ML NEBULIZATION SOLN
3.0000 mL | INHALATION_SOLUTION | Freq: Once | RESPIRATORY_TRACT | Status: DC | PRN
Start: 2022-07-30 — End: 2022-07-30

## 2022-07-30 MED ORDER — FENTANYL (PF) 50 MCG/ML INJECTION SOLUTION
INTRAMUSCULAR | Status: AC
Start: 2022-07-30 — End: 2022-07-30
  Filled 2022-07-30: qty 2

## 2022-07-30 MED ORDER — ONDANSETRON HCL (PF) 4 MG/2 ML INJECTION SOLUTION
4.0000 mg | Freq: Once | INTRAMUSCULAR | Status: AC
Start: 2022-07-30 — End: 2022-07-30
  Administered 2022-07-30: 4 mg via INTRAVENOUS

## 2022-07-30 MED ORDER — HYDROCODONE 5 MG-ACETAMINOPHEN 325 MG TABLET
1.0000 | ORAL_TABLET | ORAL | 0 refills | Status: DC | PRN
Start: 2022-07-30 — End: 2022-12-13

## 2022-07-30 MED ORDER — CEFAZOLIN 1 GRAM SOLUTION FOR INJECTION
Freq: Once | INTRAMUSCULAR | Status: DC | PRN
Start: 2022-07-30 — End: 2022-07-30
  Administered 2022-07-30: 2000 mg via INTRAVENOUS

## 2022-07-30 MED ORDER — ALBUTEROL SULFATE 2.5 MG/3 ML (0.083 %) SOLUTION FOR NEBULIZATION
2.5000 mg | INHALATION_SOLUTION | Freq: Once | RESPIRATORY_TRACT | Status: DC | PRN
Start: 2022-07-30 — End: 2022-07-30

## 2022-07-30 MED ORDER — MIDAZOLAM 5 MG/ML INJECTION WRAPPER
INTRAMUSCULAR | Status: AC
Start: 2022-07-30 — End: 2022-07-30
  Filled 2022-07-30: qty 1

## 2022-07-30 MED ORDER — KETOROLAC 30 MG/ML (1 ML) INJECTION SOLUTION
30.0000 mg | Freq: Four times a day (QID) | INTRAMUSCULAR | Status: DC | PRN
Start: 2022-07-30 — End: 2022-07-30
  Administered 2022-07-30: 30 mg via INTRAVENOUS
  Filled 2022-07-30: qty 1

## 2022-07-30 MED ORDER — FENTANYL (PF) 50 MCG/ML INJECTION WRAPPER
12.5000 ug | INJECTION | INTRAMUSCULAR | Status: DC | PRN
Start: 2022-07-30 — End: 2022-07-30

## 2022-07-30 MED ORDER — LIDOCAINE (PF) 100 MG/5 ML (2 %) INTRAVENOUS SYRINGE
INJECTION | Freq: Once | INTRAVENOUS | Status: DC | PRN
Start: 2022-07-30 — End: 2022-07-30
  Administered 2022-07-30: 80 mg via INTRAVENOUS

## 2022-07-30 MED ORDER — LIDOCAINE HCL 10 MG/ML (1 %) INJECTION SOLUTION
INTRAMUSCULAR | Status: AC
Start: 2022-07-30 — End: 2022-07-30
  Filled 2022-07-30: qty 20

## 2022-07-30 MED ORDER — FENTANYL (PF) 50 MCG/ML INJECTION WRAPPER
50.0000 ug | INJECTION | INTRAMUSCULAR | Status: DC | PRN
Start: 2022-07-30 — End: 2022-07-30

## 2022-07-30 MED ORDER — DEXAMETHASONE SODIUM PHOSPHATE 4 MG/ML INJECTION SOLUTION
4.0000 mg | Freq: Once | INTRAMUSCULAR | Status: AC
Start: 2022-07-30 — End: 2022-07-30
  Administered 2022-07-30: 4 mg via INTRAVENOUS

## 2022-07-30 MED ORDER — ONDANSETRON HCL (PF) 4 MG/2 ML INJECTION SOLUTION
4.0000 mg | Freq: Once | INTRAMUSCULAR | Status: DC | PRN
Start: 2022-07-30 — End: 2022-07-30

## 2022-07-30 MED ORDER — FENTANYL (PF) 50 MCG/ML INJECTION WRAPPER
INJECTION | Freq: Once | INTRAMUSCULAR | Status: DC | PRN
Start: 2022-07-30 — End: 2022-07-30
  Administered 2022-07-30: 100 ug via INTRAVENOUS

## 2022-07-30 MED ORDER — HYDROMORPHONE 2 MG/ML INJECTION WRAPPER
0.5000 mg | INJECTION | INTRAMUSCULAR | Status: DC | PRN
Start: 2022-07-30 — End: 2022-07-30

## 2022-07-30 MED ORDER — MIDAZOLAM 5 MG/ML INJECTION WRAPPER
2.5000 mg | Freq: Once | INTRAMUSCULAR | Status: DC | PRN
Start: 2022-07-30 — End: 2022-07-30
  Administered 2022-07-30: 2.5 mg via INTRAVENOUS

## 2022-07-30 MED ORDER — HYDROMORPHONE 2 MG/ML INJECTION WRAPPER
0.2500 mg | INJECTION | INTRAMUSCULAR | Status: DC | PRN
Start: 2022-07-30 — End: 2022-07-30

## 2022-07-30 MED ORDER — HALOPERIDOL LACTATE 5 MG/ML INJECTION SOLUTION
1.0000 mg | Freq: Once | INTRAMUSCULAR | Status: DC | PRN
Start: 2022-07-30 — End: 2022-07-30

## 2022-07-30 MED ORDER — DEXAMETHASONE SODIUM PHOSPHATE 4 MG/ML INJECTION SOLUTION
INTRAMUSCULAR | Status: AC
Start: 2022-07-30 — End: 2022-07-30
  Filled 2022-07-30: qty 1

## 2022-07-30 MED ORDER — PROPOFOL 10 MG/ML IV BOLUS
INJECTION | Freq: Once | INTRAVENOUS | Status: DC | PRN
Start: 2022-07-30 — End: 2022-07-30
  Administered 2022-07-30: 200 mg via INTRAVENOUS

## 2022-07-30 MED ORDER — HYDROCODONE 5 MG-ACETAMINOPHEN 325 MG TABLET
1.0000 | ORAL_TABLET | ORAL | Status: DC | PRN
Start: 2022-07-30 — End: 2022-07-30

## 2022-07-30 MED ORDER — FAMOTIDINE (PF) 20 MG/2 ML INTRAVENOUS SOLUTION
INTRAVENOUS | Status: AC
Start: 2022-07-30 — End: 2022-07-30
  Filled 2022-07-30: qty 2

## 2022-07-30 MED ORDER — CEFAZOLIN 1 GRAM SOLUTION FOR INJECTION
INTRAMUSCULAR | Status: AC
Start: 2022-07-30 — End: 2022-07-30
  Filled 2022-07-30: qty 20

## 2022-07-30 MED ORDER — ONDANSETRON HCL (PF) 4 MG/2 ML INJECTION SOLUTION
INTRAMUSCULAR | Status: AC
Start: 2022-07-30 — End: 2022-07-30
  Filled 2022-07-30: qty 2

## 2022-07-30 MED ORDER — SODIUM CHLORIDE 0.9 % INTRAVENOUS PIGGYBACK
INJECTION | INTRAVENOUS | Status: AC
Start: 2022-07-30 — End: 2022-07-30
  Filled 2022-07-30: qty 100

## 2022-07-30 SURGICAL SUPPLY — 149 items
ADH LIQUID LF  WTPRF VIAL PREP NONSTAIN MASTISOL STYRAX GUM MASTIC ALC MTHY SLCYT STRL CLR NHZR 2/3 (WOUND CARE SUPPLY) IMPLANT
ADH LQ LF VIAL AMP PREP MASTI_SOL STYRAX GUM MASTIC ALC MTHY (WOUND CARE/ENTEROSTOMAL SUPPLY)
ADH SKNCLS 2 OCTYL CYNCRLT DRMBND ADV LIQUID APPL MCBL (SUTURE/WOUND CLOSURE) ×2
BAG BIOHAZ RD 30X24IN THK3 MIL C8-10GL LLDPE INFCT WASTE CAN (MED SURG SUPPLIES) ×4
BAG BIOHAZ RD 30X24IN THK3 MIL C8-10GL LLDPE INFCT WASTE CAN PNCT RST (MED SURG SUPPLIES) ×4
BAG BIOHAZ RD 43X30IN THK3 MIL C20-30GL LLDPE INFCT WASTE (MED SURG SUPPLIES) ×2
BAG BIOHAZ RD 43X30IN THK3 MIL C20-30GL LLDPE INFCT WASTE CAN PNCT RST (MED SURG SUPPLIES) ×2 IMPLANT
BAG SUT DVN STRL LF (SUTURE/WOUND CLOSURE) ×2 IMPLANT
BAG SUTURE DEVON STERILE LATEX FREE (SUTURE/WOUND CLOSURE) ×2
BLADE 11 2 END CBNSTL SURG STRL DISP (SURGICAL CUTTING SUPPLIES) ×2 IMPLANT
BLADE SURG CLPR PVT ADJ HEAD 9661 STRL LF  DISP PURP (MED SURG SUPPLIES) IMPLANT
BLADE SURG CLPR PVT ADJ HEAD 9661 STRL LF DISP PURP (MED SURG SUPPLIES)
CATH URETH CLEAN 14FR 16IN UNIV INTMT SMOOTH TIP FNL END STGR EYE VNYL STRL LF  CLR (UROLOGICAL SUPPLIES) ×2 IMPLANT
CATH URETH CLEAN 14FR 16IN UNI_V INTMT SMOOTH TIP FNL END (UROLOGICAL SUPPLIES) ×2
CLEANER INSTR PREPZYME MUL-TRD CONTAINR NARSL NEUT PH BDGR (MISCELLANEOUS PT CARE ITEMS) ×2
CLEANER INSTR PREPZYME MUL-TRD CONTAINR NARSL NEUT PH BDGR 22OZ (MISCELLANEOUS PT CARE ITEMS) ×2
CLOSURE SKIN STRIPS 1/2X4IN_R1547 6/PK 50PK/BX (WOUND CARE/ENTEROSTOMAL SUPPLY)
CONTAINER SP CLIKSEAL 120CC_01063 STRL 300EA/CS (MISCELLANEOUS PT CARE ITEMS) ×2
CONTAINR CLICKSEAL 4OZ TRANSLUC SCREW CAP STRL BLU SPECI PNEUM TUBE SYS (MISCELLANEOUS PT CARE ITEMS) ×2 IMPLANT
CONV USE 102436 - NEEDLE HYPO  22GA 1.5IN STD MONOJECT SS POLYPROP REG BVL LL HUB UL SHRP ANTICORE BLU STRL LF  DISP (MED SURG SUPPLIES) ×4 IMPLANT
CONV USE ITEM 156524 - ADH SKNCLS 2 OCTYL CYNCRLT DRMBND ADV LIQUID APPL MCBL (SUTURE/WOUND CLOSURE) ×2 IMPLANT
CONV USE ITEM 321837 - GLOVE SURG 7.5 LTX PF NONST CRM (GLOVES AND ACCESSORIES) ×2 IMPLANT
CONV USE ITEM 321850 - GLOVE SURG 8.5 LF  BEAD CUF SMOOTH HI GRIP WHT 12IN MDCHC PLISPRN (GLOVES AND ACCESSORIES) IMPLANT
CONV USE ITEM 321852 - GLOVE SURG 6.5 LF  PLISPRN (GLOVES AND ACCESSORIES) ×2 IMPLANT
CONV USE ITEM 321854 - GLOVE SURG 6 LF  BEAD CUF SMOOTH HI GRIP WHT 12IN MDCHC PLISPRN (GLOVES AND ACCESSORIES) IMPLANT
CONV USE ITEM 321863 - GLOVE SURG 6.5 LF PF SMOOTH STRL GRN  PLISPRN MICRO (GLOVES AND ACCESSORIES) IMPLANT
CONV USE ITEM 321982 - GLOVE SURG 7 LTX CHEMO PF SMOOTH BEAD CUF STRL WHT 11.6IN PLMR THK.2MM THK.21MM (GLOVES AND ACCESSORIES) ×2 IMPLANT
CONV USE ITEM 321983 - GLOVE SURG 8 LTX CHEMO PF SMOOTH BEAD CUF STRL WHT 11.6IN PLMR THK.2MM THK.21MM (GLOVES AND ACCESSORIES) ×2 IMPLANT
CONV USE ITEM 329146 - CLEANER INSTR PREPZYME MUL-TRD CONTAINR NARSL NEUT PH BDGR 22OZ (MISCELLANEOUS PT CARE ITEMS) ×2 IMPLANT
CONV USE ITEM 330579 - DEVICE REM MYOSURE REACH HYSCP PLP FIBROIDS (ENDOSCOPIC SUPPLIES) IMPLANT
CONV USE ITEM 45599 - STOCKING COMPRESS LRG REG KNEE_LGTH 1 WY HRZN STRCH HEEL (MED SURG SUPPLIES) IMPLANT
CONV USE ITEM 45601 - STOCKING COMPRESS MED REG KNEE_LGTH OPN TOE HEEL PCKT 1 WY (MED SURG SUPPLIES) IMPLANT
CONV USE ITEM 81225 - BAG BIOHAZ RD 30X24IN THK3 MIL C8-10GL LLDPE INFCT WASTE CAN PNCT RST (MED SURG SUPPLIES) ×4 IMPLANT
COUNTER 20 CNT BLOCK ADH NEEDLE STRL LF  RD SHARP FOAM 15.75X11.5X14IN DISP (MED SURG SUPPLIES) ×2 IMPLANT
COUNTER 20 CNT BLOCK ADH NEEDLE STRL LF RD SHARP FOAM 15.75 (MED SURG SUPPLIES) ×2
COVER 53X24IN MAYOSTAND PRXM STRL DISP EQP SMS LF (DRAPE/PACKS/SHEETS/OR TOWEL) ×2 IMPLANT
COVER TBL 90X50IN STD SMS REINF FNFLD STRL LF  DISP (DRAPE/PACKS/SHEETS/OR TOWEL) ×2 IMPLANT
COVER TBL 90X50IN STD SMS REINF FNFLD STRL LF DISP (DRAPE/PACKS/SHEETS/OR TOWEL) ×2
DEVICE REM MYOSURE REACH HYSCP PLP FIBROIDS (ENDOSCOPIC SUPPLIES)
DEVICE REM MYOSURE REACH HYSCP_PLP FIBROIDS (INSTRUMENTS ENDOMECHANICAL)
DEVICE SPEC RETR INZII 10MM GUIDE BEAD STD ENDOS 225ML LF (ENDOSCOPIC SUPPLIES) IMPLANT
DEVICE SPEC RETR INZII 10MM GU_IDE BEAD STD ENDOS 225ML LF (INSTRUMENTS ENDOMECHANICAL)
DISCONTINUED USE 162518 - PAD SANITARY CURITY HVY ABS MOIST BARRIER NWVN CVR MATERNITY SILK FLF DISP LF  NONST 12.25X4.25IN (MED SURG SUPPLIES) ×2 IMPLANT
DRAPE ABS FENESTRATE ADH 121X102X77IN ABDOMINAL 12IN 13IN PRXM LF  STRL DISP SURG SMS 20X36IN (DRAPE/PACKS/SHEETS/OR TOWEL) ×2 IMPLANT
DRAPE ABS FENESTRATE ADH 121X1_02X77IN ABDOMINAL 12IN 13IN (DRAPE/PACKS/SHEETS/OR TOWEL) ×2
DRAPE MAYOSTAND CVR 53X24IN PR_XM LF STRL DISP EQP SMS (DRAPE/PACKS/SHEETS/OR TOWEL) ×2
DRESSING NON-ADHERENT 3X8_STRL (WOUND CARE/ENTEROSTOMAL SUPPLY) ×2
GLOVE SURG 6 LF  BEAD CUF SMOOTH HI GRIP WHT 12IN MDCHC PLISPRN (GLOVES AND ACCESSORIES)
GLOVE SURG 6 LF  PF SMOOTH BEAD CUF STRL GRN 12IN SENSICARE PI PLISPRN PLMR ALOE THK7.9 MIL DISP (GLOVES AND ACCESSORIES) IMPLANT
GLOVE SURG 6 LF PF SMOOTH BEAD CUF STRL GRN 12IN SENSICARE (GLOVES AND ACCESSORIES)
GLOVE SURG 6 LF PF SMOOTH STRL WHT PLISPRN (GLOVES AND ACCESSORIES)
GLOVE SURG 6.5 LF  PF SMOOTH STRL GRN  PLISPRN MICRO (GLOVES AND ACCESSORIES)
GLOVE SURG 6.5 LF  PLISPRN (GLOVES AND ACCESSORIES) ×2
GLOVE SURG 6.5 LF PF SMOOTH STRL WHT PLISPRN (GLOVES AND ACCESSORIES) ×2
GLOVE SURG 6.5 LTX PF BEAD CUF MICRO ROUGHEN N-PYRG STRL STRW BGL SRG CURVE FINGER (GLOVES AND ACCESSORIES) IMPLANT
GLOVE SURG 6.5 LTX PF BEAD CUF_MICRO RGH N-PYRG STRL STRW (GLOVES AND ACCESSORIES)
GLOVE SURG 7 LF  PF SMOOTH TXTR BEAD CUF STRL GRN 12IN SENSICARE PI PLISPRN SYN PLMR ALOE THK7.9 MIL (GLOVES AND ACCESSORIES) ×2 IMPLANT
GLOVE SURG 7 LF  PF STRL PLISPRN DISP (GLOVES AND ACCESSORIES) ×4 IMPLANT
GLOVE SURG 7 LF PF SMOOTH STRL WHT PLISPRN (GLOVES AND ACCESSORIES) ×4
GLOVE SURG 7 LF PF SMOOTH TXTR BEAD CUF STRL GRN 12IN (GLOVES AND ACCESSORIES) ×2
GLOVE SURG 7 LTX CHEMO PF SMOOTH BEAD CUF STRL WHT 11.6IN PLMR THK.2MM THK.21MM (GLOVES AND ACCESSORIES) ×2
GLOVE SURG 7 LTX PF SMOOTH STRL CRM (GLOVES AND ACCESSORIES) ×2
GLOVE SURG 7.5 LF  PF SMOOTH BEAD CUF STRL GRN 12IN SENSICARE PI GRN PLISPRN PLMR ALOE THK7.9 MIL (GLOVES AND ACCESSORIES) IMPLANT
GLOVE SURG 7.5 LF PF SMOOTH BEAD CUF STRL GRN 12IN (GLOVES AND ACCESSORIES)
GLOVE SURG 7.5 LF PF SMOOTH STRL WHT PLISPRN (GLOVES AND ACCESSORIES) ×4
GLOVE SURG 7.5 LTX PF NONST CRM (GLOVES AND ACCESSORIES) ×2
GLOVE SURG 7.5 LTX PF SMOOTH STRL CRM (GLOVES AND ACCESSORIES) ×2
GLOVE SURG 8 LF  BEAD CUF DERMASHIELD PLISPRN (GLOVES AND ACCESSORIES) ×2 IMPLANT
GLOVE SURG 8 LF  PF BEAD CUF SMOOTH TXTR STRL GRN 12IN SENSICARE PLISPRN SYN PLMR ALOE THK7.9 MIL (GLOVES AND ACCESSORIES) IMPLANT
GLOVE SURG 8 LF PF BEAD CUF SMOOTH TXTR STRL GRN 12IN (GLOVES AND ACCESSORIES)
GLOVE SURG 8 LF PF SMOOTH STRL WHT PLISPRN (GLOVES AND ACCESSORIES) ×2
GLOVE SURG 8 LTX CHEMO PF SMOOTH BEAD CUF STRL WHT 11.6IN PLMR THK.2MM THK.21MM (GLOVES AND ACCESSORIES) ×2
GLOVE SURG 8 LTX PF SMOOTH STRL CRM (GLOVES AND ACCESSORIES) ×2
GLOVE SURG 8.5 LF  BEAD CUF SMOOTH HI GRIP WHT 12IN MDCHC PLISPRN (GLOVES AND ACCESSORIES)
GLOVE SURG 8.5 LF PF SMOOTH STRL WHT PLISPRN (GLOVES AND ACCESSORIES)
GLOVE SURG LF  PF STRL 7.5 PLISPRN DISP (GLOVES AND ACCESSORIES) ×4 IMPLANT
GOWN SURG 2XL XLNG LGTH L3 HKLP CLSR RGLN SLEEVE TWL STRL LF (DRAPE/PACKS/SHEETS/OR TOWEL) ×2
GOWN SURG 2XL XLNG LGTH L3 HKLP CLSR RGLN SLEEVE TWL STRL LF  DISP GRN AERO BLU PRFRM FBRC (DRAPE/PACKS/SHEETS/OR TOWEL) ×2 IMPLANT
GOWN SURG LRG STD LGTH REG L3 NONREINFORCE BRTHBL TWL STRL (DRAPE/PACKS/SHEETS/OR TOWEL)
GOWN SURG LRG STD LGTH REG L3 NONREINFORCE BRTHBL TWL STRL LF  DISP BLU HALYARD SPECTRUM SMS (DRAPE/PACKS/SHEETS/OR TOWEL) IMPLANT
GOWN SURG XL STD LGTH L3 NONREINFORCE HKLP CLSR TWL STRL LF (DRAPE/PACKS/SHEETS/OR TOWEL) ×2
GOWN SURG XL STD LGTH L3 NONREINFORCE HKLP CLSR TWL STRL LF  DISP BLU SPECTRUM SMS (DRAPE/PACKS/SHEETS/OR TOWEL) ×2
GOWN SURG XL STD LGTH L3 NONREINFORCE HKLP CLSR TWL STRL LF DISP BLU SPECTRUM SMS (DRAPE/PACKS/SHEETS/OR TOWEL) ×2 IMPLANT
GOWN SURG XL XLNG L4 REINF HKLP CLSR SET IN SLEEVE STRL LF (DRAPE/PACKS/SHEETS/OR TOWEL) ×2
GOWN SURG XL XLNG L4 REINF HKLP CLSR SET IN SLEEVE STRL LF  DISP BLU SIRUS SMS PE 56IN (DRAPE/PACKS/SHEETS/OR TOWEL) ×2 IMPLANT
HANDPC ESURG 35CM 5MM THUNDERBEAT FRN ACTUATE GRIP S STRL LF  DISP (SURGICAL CUTTING SUPPLIES) ×2 IMPLANT
HANDPC ESURG 35CM 5MM THUNDERB_EAT FRN GRIP (CUTTING ELEMENTS) ×2
HDPE THK22 UM C40-45 GL L48 IN X W40 IN NATURAL (MISCELLANEOUS PT CARE ITEMS) ×4 IMPLANT
IRR SUCT 10FT STRKFL TUBE 2 SPIKE STRL LF  DISP (ENDOSCOPIC SUPPLIES) IMPLANT
IRR SUCT 10FT STRKFL TUBE 2 SPIKE STRL LF DISP (INSTRUMENTS ENDOMECHANICAL)
KIT FLUID MGMT FLUENT FLOPAK BAG TISS TRP DISP (ENDOSCOPIC SUPPLIES) ×2 IMPLANT
KIT UTER NOVASURE V5 (MED SURG SUPPLIES) ×4 IMPLANT
LABEL MED CORRECT MED LABELING SYS 4 FLG 2 SHEET 24 PRPRNT (MED SURG SUPPLIES) ×2
LABEL MED CORRECT MED LABELING SYS 4 FLG 2 SHEET 24 PRPRNT STRL (MED SURG SUPPLIES) ×2 IMPLANT
LINER SUCT MEDIVAC CRD TW LOCK LID SHTOF VALVE CAN PORT 3L LF  DISP (MED SURG SUPPLIES) ×2 IMPLANT
LINER SUCT MEDIVAC CRD TW LOCK_LID SHTOF VALVE CAN PORT 3L (MED SURG SUPPLIES) ×2
MANIPULATR ROT HNDL ENDOS CLEARVIEW 7CM UTER STRL (INSTRUMENTS ENDOMECHANICAL) ×2
MANIPULATR SND DIL SPACER SYRG ROT HNDL LAPSCP ENDOS CLEARVIEW 7CM 1CM 12ML UTER STRL DISP (ENDOSCOPIC SUPPLIES) ×2 IMPLANT
NEEDLE ENDOS 33CM 5MM CORD CUT DEPTH CONTROL XTD OUTR SHEATH (INSTRUMENTS ENDOMECHANICAL)
NEEDLE ENDOS 33CM 5MM CORD CUT DEPTH CONTROL XTD OUTR SHEATH PK SUPERPULSE GENRATR LAPSCP PKS TAPER (ENDOSCOPIC SUPPLIES) IMPLANT
NEEDLE HYPO  22GA 1.5IN STD MONOJECT SS POLYPROP REG BVL LL HUB UL SHRP ANTICORE BLU STRL LF  DISP (MED SURG SUPPLIES) ×4
NEEDLE HYPO 22GA 1.5IN STD MONOJECT SS POLYPROP REG BVL LL (MED SURG SUPPLIES) ×4
NEEDLE INSFL 150MM 14GA LONG SRGNDL PNMPRTN SPRG LD BLUNT STY SS PLASTIC RD (ENDOSCOPIC SUPPLIES) ×2 IMPLANT
NEEDLE INSFL 150MM 14GA LONG S_RGNDL PNMPRTN SPRG LD BLUNT (INSTRUMENTS ENDOMECHANICAL) ×2
PACK SURG BSC VAG DEL LF (MED SURG SUPPLIES) ×2
PACK SURG SIRUS OB III REINF TBL CVR GRAD UNDERBUTTOCK STRL 76X44IN 46X33.5IN POLY LF (MED SURG SUPPLIES) ×2 IMPLANT
PAD DRESS 8X3IN MDCHC NONADH NWVN LF  STRL DISP WHT (WOUND CARE SUPPLY) ×2 IMPLANT
PAD OB BULK 2022A_14EA/PK 12PK/CS (MED SURG SUPPLIES) ×2
PAD SANITARY CURITY HVY ABS MOIST BARRIER NWVN CVR MATERNITY SILK FLF DISP LF  NONST 12.25X4.25IN (MED SURG SUPPLIES) ×2
SET ENDOS INSTR MYOSURE SEAL HYSCP OFLW CHNL DISP (ENDOSCOPIC SUPPLIES) ×2 IMPLANT
SET ENDOS INSTR MYOSURE SEAL H_YSCP OFLW CHNL DISP (INSTRUMENTS ENDOMECHANICAL) ×2
SLEEVE COMPRESS LRG KNEE LGTH KENDALL SCD NONST LF  DISP 26- IN (MED SURG SUPPLIES) ×2 IMPLANT
SLEEVE COMPRESS LRG KNEE LGTH KENDALL SCD NONST LF DISP 26- (MED SURG SUPPLIES) ×2
SLEEVE COMPRESS MED KNEE LGTH KENDALL SCD SEQ NONST LF  DISP 21- IN DVT PE (MED SURG SUPPLIES) IMPLANT
SLEEVE COMPRESS MED KNEE LGTH KENDALL SCD SEQ NONST LF DISP (MED SURG SUPPLIES)
SLEEVE LAPSCP 5MM ACCESS SYS 100MM KII ABDOMINAL ADV FIX CANN SEAL STRL LF (ENDOSCOPIC SUPPLIES) ×8 IMPLANT
SOL ANFG DFGR ISOPRPNL PAD OVAL BTL NABRSV ADH STRL LF  DISP (ENDOSCOPIC SUPPLIES) ×2 IMPLANT
SOL IRRG 0.9% NACL 1000ML PLASTIC PR BTL ISTNC N-PYRG STRL LF (MEDICATIONS/SOLUTIONS) ×2 IMPLANT
SOL IRRG 0.9% NACL 2000ML PRSV FR N-PYRG FLXB CONTAINR STRL (MEDICATIONS/SOLUTIONS)
SOL IRRG 0.9% NACL 2000ML PRSV FR N-PYRG FLXB CONTAINR STRL LF (MEDICATIONS/SOLUTIONS) IMPLANT
SOL IV LR 1000ML N-PYRG FLXB CONTAINR STRL LF (MEDICATIONS/SOLUTIONS) ×4
SOL IV LR 1000ML PRSV FR FLXB CONTAINR LF (MEDICATIONS/SOLUTIONS) ×4 IMPLANT
SOLUTION ANTI FOG W/SPONGE_280101 DEFOG ENDOMATE 20EA/CS (INSTRUMENTS ENDOMECHANICAL) ×2
SOLUTION IRRG NS 2F7124 1000CC_12/CS (MEDICATIONS/SOLUTIONS) ×2
SPONGE LAP 18X18IN PREWASH RIGID TRY STRL LF  WHT (MED SURG SUPPLIES) ×2 IMPLANT
SPONGE LAP 18X18IN PREWASH RIGID TRY STRL LF WHT (MED SURG SUPPLIES) ×2
SPONGE SURG 4X4IN 16 PLY XRY DETECT COTTON STRL LF  DISP (WOUND CARE SUPPLY) ×4 IMPLANT
SPONGE SURG 4X4IN 16 PLY_RADOPQ COT STRL LF DISP (WOUND CARE/ENTEROSTOMAL SUPPLY) ×4
STOCKING COMPRESS LRG REG KNEE_LGTH 1 WY HRZN STRCH HEEL (MED SURG SUPPLIES)
STOCKING COMPRESS MED REG KNEE_LGTH OPN TOE HEEL PCKT 1 WY (MED SURG SUPPLIES)
STRIP 4X.5IN STRSTRP PLSTR REINF SKNCLS WHT STRL LF (WOUND CARE SUPPLY) IMPLANT
SUTURE 3-0 C-13 POLYSRB 30IN UNDYED BRD COAT ABS (SUTURE/WOUND CLOSURE) IMPLANT
SUTURE 4-0 PS2 MONOCRYL MTPS 18IN UNDYED MONOF ABS (SUTURE/WOUND CLOSURE) ×4 IMPLANT
SUTURE 4-0 PS2 MONOCRYL MTPS 1_8IN UNDYED MONOF ABS (SUTURE/WOUND CLOSURE) ×4
SYRINGE LL 10ML LF  STRL GRAD N-PYRG DEHP-FR PVC FREE MED DISP (MED SURG SUPPLIES) ×6 IMPLANT
SYRINGE LL 10ML LF STRL MED D_ISP (MED SURG SUPPLIES) ×6
SYSTEM 5X100MMM KII SLEEVE (INSTRUMENTS ENDOMECHANICAL) ×8
SYSTEM FIX KII TROCAR 5X100MM (INSTRUMENTS ENDOMECHANICAL) ×2
TOWEL 24X16IN COTTON BLU DISP SURG STRL LF (DRAPE/PACKS/SHEETS/OR TOWEL) ×4 IMPLANT
TROCAR LAPSCP 100MM 11MM KII FIOS ADV FIX SLEEVE 1ST ENTRY (INSTRUMENTS ENDOMECHANICAL) ×2
TROCAR LAPSCP 100MM 11MM KII FIOS ADV FIX SLEEVE 1ST ENTRY STRL LF  ACCESS SYS ABDOMINAL (ENDOSCOPIC SUPPLIES) ×2 IMPLANT
TROCAR LAPSCP 100MM 5MM KII ADV FIX SLEEVE RETENTION DISC STRL LF  OPTC ACCESS SYS ABDOMINAL (ENDOSCOPIC SUPPLIES) ×2 IMPLANT
TUBE BUBBLE CONNECTING_8888280214 1EA/BX/CS (MED SURG SUPPLIES) ×2
TUBING CONN 6MM X 3.7M (MED SURG SUPPLIES) ×2
TUBING INSFL 20L DISP UHI 3 UHI 2 (ENDOSCOPIC SUPPLIES) ×2 IMPLANT
TUBING SUCT CLR 100FT 3/16IN ARGYLE UNIV PVC NCDTV BBL NONST LF (MED SURG SUPPLIES) ×2 IMPLANT
TUBING SUCT CLR 12FT .25IN ARGYLE PVC NCDTV STR MALE FEMALE MLD CONN STRL LF (MED SURG SUPPLIES) ×2 IMPLANT
WATER STRL 1000ML PLASTIC PR BTL LF (MED SURG SUPPLIES) ×2 IMPLANT
WATER STRL 1000ML PLASTIC PR B_TL LF (MED SURG SUPPLIES) ×2

## 2022-07-30 NOTE — Anesthesia Preprocedure Evaluation (Signed)
ANESTHESIA PRE-OP EVALUATION  Planned Procedure: DIAGNOSTIC LAPAROSCOPY; BILATERAL SALPINGECTOMIES; (Bilateral: Abdomen)  HYSTEROSCOPY; DILATION AND CURETTAGE; NOVASURE ABALATION (Vagina )  Review of Systems     anesthesia history negative     patient summary reviewed  nursing notes reviewed        Pulmonary  negative pulmonary ROS,    Cardiovascular  negative cardio ROS,    Exercise Tolerance: > or = 4 METS        GI/Hepatic/Renal    GERD        Endo/Other    obesity,      Neuro/Psych/MS   negative neuro/psych ROS,      Cancer    negative hematology/oncology ROS,                   Physical Assessment      Airway       Mallampati: III    TM distance: >3 FB    Mouth Opening: fair.            Dental                    Pulmonary           Cardiovascular             Other findings          Plan  ASA 2     Planned anesthesia type: general     general anesthesia with endotracheal tube intubation                        Anesthetic plan and risks discussed with patient             Patient's NPO status is appropriate for Anesthesia.           (glidescope)

## 2022-07-30 NOTE — Anesthesia Postprocedure Evaluation (Signed)
Anesthesia Post Op Evaluation    Patient: Lindsay Crawford  Procedure(s):  DIAGNOSTIC LAPAROSCOPY; BILATERAL SALPINGECTOMIES;  HYSTEROSCOPY; DILATION AND CURETTAGE, LAPAROSCOPIC ENTEROLYSIS    Last Vitals:Temperature: 36.2 C (97.1 F) (07/30/22 1010)  Heart Rate: 68 (07/30/22 1010)  BP (Non-Invasive): 124/68 (07/30/22 1010)  Respiratory Rate: 12 (07/30/22 1010)  SpO2: 100 % (07/30/22 1010)    No notable events documented.    Patient is sufficiently recovered from the effects of anesthesia to participate in the evaluation and has returned to their pre-procedure level.  Patient location during evaluation: PACU       Patient participation: complete - patient participated  Level of consciousness: awake and alert and responsive to verbal stimuli    Pain management: adequate  Airway patency: patent    Anesthetic complications: no  Cardiovascular status: acceptable  Respiratory status: acceptable  Hydration status: acceptable  Patient post-procedure temperature: Pt Normothermic   PONV Status: Absent

## 2022-07-30 NOTE — Discharge Instructions (Signed)
Rto:  keep your return appointment,     May shower beginning tomorrow evening.  Lightly pat areas dry.    No sex, tampons, douching, tub baths, swimming or hot tubs.    Please call the office if you should have to use more than 1 saturated pad in an hour, start running a fever greater than 100.4 f or start having any foul smelling drainage or discharge.    Ambulate with periods of rest.    No heavy lifting more than 15 pounds until your return appointment.    Please call the office for any problems and or concerns.    May resume all home medications.    Please stop by your pharmacy and pick up your prescription.

## 2022-07-30 NOTE — Anesthesia Transfer of Care (Signed)
ANESTHESIA TRANSFER OF CARE   Lindsay Crawford is a 46 y.o. ,female, Weight: 109 kg (240 lb)   had Procedure(s):  DIAGNOSTIC LAPAROSCOPY; BILATERAL SALPINGECTOMIES;  HYSTEROSCOPY; DILATION AND CURETTAGE, LAPAROSCOPIC ENTEROLYSIS  performed  07/30/22   Primary Service: Conley Rolls, DO    Past Medical History:   Diagnosis Date    Body mass index 35.0-35.9, adult     Borderline diabetes     Chronic low back pain     Dizziness     DUB (dysfunctional uterine bleeding)     Gastroparesis     GERD (gastroesophageal reflux disease)     Hyperglycemia     Hyperlipidemia LDL goal <70     Insomnia     Knee pain, bilateral     Migraine     Paraspinal muscle spasm     Vitamin D deficiency       Allergy History as of 07/30/22       OTHER         Noted Status Severity Type Reaction    02/03/22 1224 Bowling, Dorena Cookey, DO 03/31/18 Active Medium  Rash    Comments: Allergic to the sun                    I completed my transfer of care / handoff to the receiving personnel during which we discussed:  Access, Airway, All key/critical aspects of case discussed, Analgesia, Antibiotics, Expectation of post procedure, Fluids/Product, Gave opportunity for questions and acknowledgement of understanding, Labs and PMHx      Post Location: PACU                                                           Last OR Temp: Temperature: 36.1 C (97 F)  ABG:  POTASSIUM   Date Value Ref Range Status   05/25/2022 4.0 3.5 - 5.1 mmol/L Final     KETONES   Date Value Ref Range Status   07/27/2022 Negative Negative, Trace mg/dL Final     CALCIUM   Date Value Ref Range Status   05/25/2022 9.4 8.6 - 10.3 mg/dL Final     Airway:* No LDAs found *  Blood pressure 127/65, pulse 72, temperature 36.1 C (97 F), resp. rate 14, height 1.727 m ('5\' 8"'$ ), weight 109 kg (240 lb), SpO2 100 %.

## 2022-07-30 NOTE — OR Surgeon (Signed)
Conley Rolls, DO  Physician  PRN Gynecology   OR Surgeon      Signed  Date of Service:             Preop diagnosis:  1. Chronic pelvic pain dysmenorrhea 2.  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  Laparoscopic bilateral tubal occlusion 2.  Laparoscopic enterolysis (separate procedure)  Diagnostic hysteroscopy with D&C   Anesthesia:   General via ETT  Indications :   The patient is a 46 year old single white female gravida 1 para 1001 last regular menstrual 23rd August 2023 birth control none who presents for diagnostic laparoscopy bilateral tubal occlusion hysteroscopy D&C possible NovaSure ablation evaluation of chronic pelvic pain dysmenorrhea with menometrorrhagia;  patient also desires elective and  permanent sterilization    Finding:  examination under anesthesia reveals a multiparous cervix; uterus retroverted approximately 10 week size uterus sounds to 12 cm diagnostic hysteroscopy revealed a normal uterine cavity; cavity was shortened in appearance; Endometrial curettage was performed . A NovaSure endometrial device was unable to be deployed secondary to the second-degree to the small uterine cavity.  Diagnostic laparoscopy revealed an 10 week size uterus with a 3 cm subserosal leiomyoma broad-based at the fundus there was no evidence of endometriosis of the right tube was densely he is right pelvic wall anteriorly.  There was omentum adherent to the umbilicus.  Laparoscopic enterolysis was performed using the Thunderbeat  a mid 6 segment of each fallopian tube was coagulated and  divided since the right tube was adhesed in the right pelvic sidewall anteriorly there was no evidence of endometriosis.       Description of the Procedure:  After informed consent was obtained the patient was taken to the operating room and placed on the operating table in the dorsal lithotomy position with care taken to avoid excessive hip or knee adduction or abduction. Sequential compression devices were placed on the patient's lower extremities and activated prior to the induction of anesthesia. General anesthesia was induced without difficulty. A surgical pause was performed identifying the patient and procedure to be done and all were in agreement.   The vagina was prepped and draped in the usual sterile fashion. The bladder was drained with a red rubber catheter prior to the procedure. A weighted speculum and right angle retractor were inserted in the vagina.  Ardis Hughs tenacula was used to grasp the anterior lip of the cervix.  The uterus was sounded to 12  cm. The cervix was serially dilated to #8. Hegar dilator The hysteroscope was then inserted into the uterus. The endometrial lining was normal appearance cavity however was shortened and narrow.  A sharp curettage was then performed starting at 12 o'clock and ending at 12 o'clock until uterine crie was achieved. Endometrial curettings were sent to pathology.   NovaSure endometrial device was unable to be deployed despite multiple attempts second-degree to the small uterine cavity.t cavity A disposable uterine manipulator was placed transcervically and the balloon inflated with 5 cc of saline.     10 cc of 1% lidocaine was infiltrated in the umbilicus and  the left lower quadrant.  The abdomen was elevated using 2 towel clips placed at the umbilicus.  A 1 cm vertical skin incision was  made in the infraumbilical fold.  The Veress needle was placed through the previous incision entering the abdomen with adequate placement verified using a hanging drop sign.  3  L of carbon dioxide gas placed through the Veress needle creating an adequate pneumoperitoneum.  3/5 mm disposable blunt port and sheath was placed through the infraumbilical site under direct visualization.  Additional 3/ 5 mm blunt port and sheath was placed through a small stab incision in the left lower quadrant.  The advance fixation balloons were  filled with 5 cc of air.  Additional 3 5 mm blunt port and sheath was placed just inferior to the left lower quadrant incision.  The omental band adherent to the anterior abdominal.  Near the umbilicus was coagulated and divided using the Thunderbeat.  A bilateral laparoscopic tubal occlusion was performed with the Thunderbeat at the isthmic portion of each fallopian tube and then subsequently divided using the Thunderbeat proximal and distal ends were coagulated.  Ureters were well out of harm's way pelvis and abdomen were irrigated following the procedure.   Following the procedure the pneumoperitoneum was evacuated and the 3 5 mm sheaths removed.  This skin incisions were closed interrupted subcuticular fashion using 4-0 Biosyn.  Dermabond was applied each skin incision.     The patient was subsequently extubated transferred to gurney and in the recovery room in apparent stable condition.      Complications: none         Sincerely, Birdie Hopes.  DO FACOG

## 2022-08-02 ENCOUNTER — Encounter (INDEPENDENT_AMBULATORY_CARE_PROVIDER_SITE_OTHER): Payer: Self-pay | Admitting: OBSTETRICS/GYNECOLOGY

## 2022-08-03 DIAGNOSIS — R102 Pelvic and perineal pain: Secondary | ICD-10-CM

## 2022-08-03 DIAGNOSIS — N926 Irregular menstruation, unspecified: Secondary | ICD-10-CM

## 2022-08-03 LAB — SURGICAL PATHOLOGY SPECIMEN

## 2022-08-12 ENCOUNTER — Ambulatory Visit (INDEPENDENT_AMBULATORY_CARE_PROVIDER_SITE_OTHER): Payer: MEDICAID | Admitting: OBSTETRICS/GYNECOLOGY

## 2022-08-12 ENCOUNTER — Encounter (INDEPENDENT_AMBULATORY_CARE_PROVIDER_SITE_OTHER): Payer: Self-pay | Admitting: OBSTETRICS/GYNECOLOGY

## 2022-08-12 ENCOUNTER — Other Ambulatory Visit: Payer: Self-pay

## 2022-08-12 VITALS — BP 121/74 | HR 74 | Ht 68.0 in | Wt 239.0 lb

## 2022-08-12 DIAGNOSIS — Z09 Encounter for follow-up examination after completed treatment for conditions other than malignant neoplasm: Secondary | ICD-10-CM

## 2022-08-12 NOTE — Nursing Note (Signed)
Post op Dx Laparoscopy Hysteroscopy D&C with Bilateral Tubal Occlusion on 07/30/2022  No problems,   Colon Flattery, LPN

## 2022-08-13 HISTORY — PX: NISSEN FUNDOPLICATION: SHX2091

## 2022-08-25 ENCOUNTER — Other Ambulatory Visit: Payer: Self-pay

## 2022-08-25 ENCOUNTER — Ambulatory Visit (RURAL_HEALTH_CENTER): Payer: MEDICAID | Attending: Family Medicine | Admitting: Family Medicine

## 2022-08-25 ENCOUNTER — Encounter (RURAL_HEALTH_CENTER): Payer: Self-pay | Admitting: Family Medicine

## 2022-08-25 ENCOUNTER — Ambulatory Visit: Payer: Medicaid Other | Attending: Family Medicine | Admitting: Family Medicine

## 2022-08-25 VITALS — BP 100/70 | HR 86 | Temp 99.0°F | Ht 68.0 in | Wt 232.0 lb

## 2022-08-25 DIAGNOSIS — K219 Gastro-esophageal reflux disease without esophagitis: Secondary | ICD-10-CM | POA: Insufficient documentation

## 2022-08-25 DIAGNOSIS — G43709 Chronic migraine without aura, not intractable, without status migrainosus: Secondary | ICD-10-CM | POA: Insufficient documentation

## 2022-08-25 DIAGNOSIS — R7303 Prediabetes: Secondary | ICD-10-CM | POA: Insufficient documentation

## 2022-08-25 DIAGNOSIS — N390 Urinary tract infection, site not specified: Secondary | ICD-10-CM | POA: Insufficient documentation

## 2022-08-25 DIAGNOSIS — Z9889 Other specified postprocedural states: Secondary | ICD-10-CM | POA: Insufficient documentation

## 2022-08-25 DIAGNOSIS — E785 Hyperlipidemia, unspecified: Secondary | ICD-10-CM | POA: Insufficient documentation

## 2022-08-25 DIAGNOSIS — K581 Irritable bowel syndrome with constipation: Secondary | ICD-10-CM | POA: Insufficient documentation

## 2022-08-25 DIAGNOSIS — F5101 Primary insomnia: Secondary | ICD-10-CM | POA: Insufficient documentation

## 2022-08-25 LAB — CBC WITH DIFF
BASOPHIL #: 0.3 10*3/uL — ABNORMAL HIGH (ref 0.00–0.10)
BASOPHIL %: 3 % — ABNORMAL HIGH (ref 0–1)
EOSINOPHIL #: 0.4 10*3/uL (ref 0.00–0.50)
EOSINOPHIL %: 4 %
HCT: 40.1 % (ref 31.2–41.9)
HGB: 13.8 g/dL (ref 10.9–14.3)
LYMPHOCYTE #: 2.7 10*3/uL (ref 1.00–3.00)
LYMPHOCYTE %: 26 % (ref 16–44)
MCH: 29.9 pg (ref 24.7–32.8)
MCHC: 34.3 g/dL (ref 32.3–35.6)
MCV: 87.2 fL (ref 75.5–95.3)
MONOCYTE #: 0.5 10*3/uL (ref 0.30–1.00)
MONOCYTE %: 5 % (ref 5–13)
MPV: 8.6 fL (ref 7.9–10.8)
NEUTROPHIL #: 6.6 10*3/uL (ref 1.85–7.80)
NEUTROPHIL %: 63 % (ref 43–77)
PLATELETS: 362 10*3/uL (ref 140–440)
RBC: 4.6 10*6/uL (ref 3.63–4.92)
RDW: 12.5 % (ref 12.3–17.7)
WBC: 10.4 10*3/uL (ref 3.8–11.8)

## 2022-08-25 LAB — URINALYSIS, MACROSCOPIC
BILIRUBIN: NEGATIVE mg/dL
BLOOD: NEGATIVE mg/dL
GLUCOSE: NEGATIVE mg/dL
KETONES: NEGATIVE mg/dL
LEUKOCYTES: 500 WBCs/uL — AB
PH: 6.5 (ref 5.0–9.0)
PROTEIN: 100 mg/dL — AB
SPECIFIC GRAVITY: 1.023 (ref 1.002–1.030)
UROBILINOGEN: 2 mg/dL — AB

## 2022-08-25 LAB — URINALYSIS, MICROSCOPIC
RBCS: 21 /hpf — ABNORMAL HIGH (ref <4)
SQUAMOUS EPITHELIAL: 30 /hpf — ABNORMAL HIGH (ref <28)
WBCS: 59 /hpf — ABNORMAL HIGH (ref <6)

## 2022-08-25 LAB — LIPID PANEL
CHOL/HDL RATIO: 3.4
CHOLESTEROL: 153 mg/dL (ref <200)
HDL CHOL: 45 mg/dL (ref 23–92)
LDL CALC: 79 mg/dL (ref 0–100)
TRIGLYCERIDES: 143 mg/dL (ref <=150)
VLDL CALC: 29 mg/dL (ref 0–50)

## 2022-08-25 LAB — COMPREHENSIVE METABOLIC PANEL, NON-FASTING
ALBUMIN/GLOBULIN RATIO: 1.4 (ref 0.8–1.4)
ALBUMIN: 4.6 g/dL (ref 3.5–5.7)
ALKALINE PHOSPHATASE: 78 U/L (ref 34–104)
ALT (SGPT): 32 U/L (ref 7–52)
ANION GAP: 8 mmol/L (ref 4–13)
AST (SGOT): 20 U/L (ref 13–39)
BILIRUBIN TOTAL: 0.4 mg/dL (ref 0.3–1.2)
BUN/CREA RATIO: 10 (ref 6–22)
BUN: 10 mg/dL (ref 7–25)
CALCIUM, CORRECTED: 9.3 mg/dL (ref 8.9–10.8)
CALCIUM: 9.9 mg/dL (ref 8.6–10.3)
CHLORIDE: 109 mmol/L — ABNORMAL HIGH (ref 98–107)
CO2 TOTAL: 25 mmol/L (ref 21–31)
CREATININE: 1.01 mg/dL (ref 0.60–1.30)
ESTIMATED GFR: 70 mL/min/{1.73_m2} (ref >59)
GLOBULIN: 3.4 (ref 2.9–5.4)
GLUCOSE: 102 mg/dL (ref 74–109)
OSMOLALITY, CALCULATED: 282 mOsm/kg (ref 270–290)
POTASSIUM: 3.8 mmol/L (ref 3.5–5.1)
PROTEIN TOTAL: 8 g/dL (ref 6.4–8.9)
SODIUM: 142 mmol/L (ref 136–145)

## 2022-08-25 LAB — THYROID STIMULATING HORMONE (SENSITIVE TSH): TSH: 1.83 u[IU]/mL (ref 0.450–5.330)

## 2022-08-25 LAB — MAGNESIUM: MAGNESIUM: 1.9 mg/dL (ref 1.9–2.7)

## 2022-08-25 LAB — VITAMIN B12: VITAMIN B 12: >1500 pg/mL — ABNORMAL HIGH (ref 180–914)

## 2022-08-25 MED ORDER — TOPIRAMATE 100 MG TABLET
100.0000 mg | ORAL_TABLET | Freq: Two times a day (BID) | ORAL | 3 refills | Status: DC
Start: 2022-08-25 — End: 2022-12-27

## 2022-08-25 MED ORDER — LINACLOTIDE 145 MCG CAPSULE
145.0000 ug | ORAL_CAPSULE | Freq: Every morning | ORAL | 3 refills | Status: DC
Start: 2022-08-25 — End: 2022-11-11

## 2022-08-25 MED ORDER — BETHANECHOL CHLORIDE 5 MG TABLET
5.0000 mg | ORAL_TABLET | Freq: Two times a day (BID) | ORAL | 3 refills | Status: DC
Start: 2022-08-25 — End: 2023-04-27

## 2022-08-25 MED ORDER — METOCLOPRAMIDE 5 MG TABLET
5.0000 mg | ORAL_TABLET | Freq: Every day | ORAL | 3 refills | Status: DC
Start: 2022-08-25 — End: 2022-12-27

## 2022-08-25 MED ORDER — ROSUVASTATIN 5 MG TABLET
5.0000 mg | ORAL_TABLET | Freq: Every day | ORAL | 3 refills | Status: DC
Start: 2022-08-25 — End: 2022-12-27

## 2022-08-25 MED ORDER — POLYETHYLENE GLYCOL 3350 17 GRAM ORAL POWDER PACKET
17.0000 g | Freq: Every day | ORAL | 11 refills | Status: DC | PRN
Start: 2022-08-25 — End: 2022-12-27

## 2022-08-25 MED ORDER — FERROUS SULFATE 325 MG (65 MG IRON) TABLET
325.0000 mg | ORAL_TABLET | Freq: Every day | ORAL | 3 refills | Status: DC
Start: 2022-08-25 — End: 2022-12-27

## 2022-08-25 MED ORDER — OMEPRAZOLE 40 MG CAPSULE,DELAYED RELEASE
40.0000 mg | DELAYED_RELEASE_CAPSULE | Freq: Two times a day (BID) | ORAL | 3 refills | Status: DC
Start: 2022-08-25 — End: 2022-12-27

## 2022-08-25 MED ORDER — AMOXICILLIN 875 MG-POTASSIUM CLAVULANATE 125 MG TABLET
1.0000 | ORAL_TABLET | Freq: Two times a day (BID) | ORAL | 0 refills | Status: AC
Start: 2022-08-25 — End: 2022-09-04

## 2022-08-25 MED ORDER — CYCLOBENZAPRINE 10 MG TABLET
10.0000 mg | ORAL_TABLET | Freq: Two times a day (BID) | ORAL | 3 refills | Status: DC
Start: 2022-08-25 — End: 2022-12-27

## 2022-08-25 MED ORDER — FAMOTIDINE 40 MG TABLET
40.0000 mg | ORAL_TABLET | Freq: Every evening | ORAL | 3 refills | Status: DC
Start: 2022-08-25 — End: 2022-12-27

## 2022-08-25 MED ORDER — TRAZODONE 300 MG TABLET
300.0000 mg | ORAL_TABLET | Freq: Every day | ORAL | 3 refills | Status: DC
Start: 2022-08-25 — End: 2022-12-27

## 2022-08-25 MED ORDER — CHOLECALCIFEROL (VITAMIN D3) 1,250 MCG (50,000 UNIT) CAPSULE
50000.0000 [IU] | ORAL_CAPSULE | ORAL | 3 refills | Status: DC
Start: 2022-08-25 — End: 2023-04-27

## 2022-08-25 MED ORDER — CETIRIZINE 10 MG TABLET
10.0000 mg | ORAL_TABLET | Freq: Every day | ORAL | 3 refills | Status: DC
Start: 2022-08-25 — End: 2022-12-27

## 2022-08-25 NOTE — Assessment & Plan Note (Signed)
Benefiting with decreased migraine frequency with topamax. Pt to continue

## 2022-08-25 NOTE — Assessment & Plan Note (Signed)
Will need to eat small, frequent meals. Experiencing some gastric dumping

## 2022-08-25 NOTE — Assessment & Plan Note (Signed)
Continue Crestor at night. Recheck lipid

## 2022-08-25 NOTE — Assessment & Plan Note (Signed)
Recheck lab

## 2022-08-25 NOTE — Assessment & Plan Note (Signed)
Linzess Is not helping at the current dose.  We will increase from 72 mcg to 145 daily.

## 2022-08-25 NOTE — Progress Notes (Signed)
[facility], [place]  [place] [address]  Operated by [facility]     Name: Lindsay Crawford MRN:  [id number]   Date of Birth: 1976/05/27 Age: 46 y.o.   Date: 08/25/2022  Time: 16:56     Provider: Feliberto Harts, DO      Assessment/Plan:  Problem List Items Addressed This Visit          Cardiovascular System    Hyperlipidemia LDL goal <70 - Primary     Continue Crestor at night. Recheck lipid         Relevant Orders    LIPID PANEL       Neurologic    Insomnia     Continue trazodone 300 mg po at night.  Don't use flexeril at the same time as the trazodone         Migraine     Benefiting with decreased migraine frequency with topamax. Pt to continue            Digestive    Irritable bowel syndrome with constipation     Linzess Is not helping at the current dose.  We will increase from 72 mcg to 145 daily.         GERD (gastroesophageal reflux disease)     I have asked her to continue taking her Reglan, bethanechol, ppi, and famotidine for the next 4 weeks postoperatively.  I would like to see her wean off 1st the Reglan and the bethanechol.  Following this, I would like her to decrease her PPI to once daily and continue with the famotidine at night.  If she still is able to have relief from her heartburn symptoms, I would like her to reduce the famotidine and the PPI to as-needed doses         Relevant Orders    CBC/DIFF    COMPREHENSIVE METABOLIC PANEL, NON-FASTING       Endocrine    Borderline diabetes     Recheck lab         Relevant Orders    HGA1C (HEMOGLOBIN A1C WITH EST AVG GLUCOSE)    THYROID STIMULATING HORMONE (SENSITIVE TSH)       Other    S/P Nissen fundoplication (without gastrostomy tube) procedure     Will need to eat small, frequent meals. Experiencing some gastric dumping         Relevant Orders    MAGNESIUM    VITAMIN B12     Other Visit Diagnoses       UTI (urinary tract infection)        Relevant Orders    URINALYSIS,  MACROSCOPIC AND MICROSCOPIC W/CULTURE REFLEX (Completed)    URINE CULTURE,ROUTINE         Had UTI. Will tx with Augmentin         No follow-ups on file.      Orders Placed This Encounter    CBC/DIFF    COMPREHENSIVE METABOLIC PANEL, NON-FASTING    HGA1C (HEMOGLOBIN A1C WITH EST AVG GLUCOSE)    LIPID PANEL    THYROID STIMULATING HORMONE (SENSITIVE TSH)    MAGNESIUM    VITAMIN B12    VITAMIN D 25 TOTAL    IRON TRANSFERRIN AND TIBC    Refer to OB/GYN CS Raymond Brodnik    linaCLOtide (LINZESS) 72 mcg Oral Capsule    bethanechol chloride (URECHOLINE) 5 mg Oral Tablet    cetirizine (ZYRTEC) 10 mg Oral Tablet  cholecalciferol, vitamin D3, 1,250 mcg (50,000 unit) Oral Capsule    cyclobenzaprine (FLEXERIL) 10 mg Oral Tablet    famotidine (PEPCID) 40 mg Oral Tablet    metoclopramide HCl (REGLAN) 5 mg Oral Tablet    omeprazole (PRILOSEC) 40 mg Oral Capsule, Delayed Release(E.C.)    rosuvastatin (CRESTOR) 5 mg Oral Tablet    topiramate (TOPAMAX) 100 mg Oral Tablet    TraZODone (DESYREL) 300 mg Oral Tablet              Discontinued Medications     LINACLOTIDE (LINZESS) 145 MCG ORAL CAPSULE    Take 1 Capsule (145 mcg total) by mouth Every morning for 90 days     MELOXICAM (MOBIC) 7.5 MG ORAL TABLET    Take 1 Tablet (7.5 mg total) by mouth Once a day for 90 days      Reason for visit: Follow Up 6 Months (Having pain in the hip left worse than r/Feels like fluid in ears)        History of Present Illness:  Lindsay Crawford is a 46 y.o. female with gastroparesis, GERD, obesity, borderline diabetes, chronic low back pain and DUB presents for chronic disease management.    Nissan fundoplication on 5/46. Has significant decrease in heartburn since surgery.  Dr. Lilia Pro in [place] follows her for GI stuff  Btl and d and c on 9/1 with Dr. Sherlene Shams       Historical Data       Past Medical History:       Past Medical History:   Diagnosis Date    Body mass index 35.0-35.9, adult      Borderline diabetes      Chronic low back pain       Dizziness      DUB (dysfunctional uterine bleeding)      Gastroparesis      GERD (gastroesophageal reflux disease)      Hyperglycemia      Hyperlipidemia LDL goal <70      Insomnia      Knee pain, bilateral      Migraine      Paraspinal muscle spasm      Vitamin D deficiency              Past Surgical History:        Past Surgical History:   Procedure Laterality Date    CESAREAN SECTION   2004    COLONOSCOPY         Dr. Lilia Pro in [place] / EGD        HX APPENDECTOMY        HX BACK SURGERY        HX LAP CHOLECYSTECTOMY                Allergies:        Allergies   Allergen Reactions    Other Rash       Allergic to the sun       Medications:  medroxyPROGESTERone (PROVERA) 10 mg Oral Tablet, Take 1 Tablet (10 mg total) by mouth Twice daily  polyethylene glycol (MIRALAX) 17 gram/dose Oral Powder, Take 1 g by mouth Once a day  bethanechol chloride (URECHOLINE) 5 mg Oral Tablet, Take 1 Tablet (5 mg total) by mouth Twice daily for 90 days  cetirizine (ZYRTEC) 10 mg Oral Tablet, Take 1 Tablet (10 mg total) by mouth Once a day for 90 days  cholecalciferol, vitamin D3, 1,250 mcg (50,000 unit) Oral  Capsule, Take 1 Capsule (50,000 Units total) by mouth Every 7 days for 90 days  cyclobenzaprine (FLEXERIL) 10 mg Oral Tablet, Take 1 Tablet (10 mg total) by mouth Twice daily for 90 days  famotidine (PEPCID) 40 mg Oral Tablet, Take 1 Tablet (40 mg total) by mouth Once a day for 90 days  linaCLOtide (LINZESS) 145 mcg Oral Capsule, Take 1 Capsule (145 mcg total) by mouth Every morning for 90 days  meloxicam (MOBIC) 7.5 mg Oral Tablet, Take 1 Tablet (7.5 mg total) by mouth Once a day for 90 days  metoclopramide HCl (REGLAN) 5 mg Oral Tablet, Take 1 Tablet (5 mg total) by mouth Twice daily for 90 days  omeprazole (PRILOSEC) 40 mg Oral Capsule, Delayed Release(E.C.), Take 1 Capsule (40 mg total) by mouth Once a day for 90 days  rosuvastatin (CRESTOR) 5 mg Oral Tablet, Take 1 Tablet (5 mg total) by mouth Once a day for  90 days  topiramate (TOPAMAX) 100 mg Oral Tablet, Take 1 Tablet (100 mg total) by mouth Twice daily for 90 days  TraZODone (DESYREL) 300 mg Oral Tablet, Take 1 Tablet (300 mg total) by mouth Once a day for 30 days     No facility-administered medications prior to visit.     Family History:       Family Medical History:      Problem Relation (Age of Onset)     COPD Father     Coronary Artery Disease Brother     Diabetes type II Father     Hypertension (High Blood Pressure) Mother, Father     Kidney Disease Father     Throat cancer Sister             Social History:  Social History            Socioeconomic History    Marital status: Widowed    Number of children: 1    Years of education: 12+    Highest education level: Associate degree: occupational, Hotel manager, or vocational program   Tobacco Use    Smoking status: Never    Smokeless tobacco: Never   Vaping Use    Vaping Use: Never used   Substance and Sexual Activity    Alcohol use: Never    Drug use: Never    Sexual activity: Yes       Partners: Male       Birth control/protection: None               Review of Systems:  Any pertinent Review of Systems as addressed in the HPI above.     Physical Exam:  Vital Signs:      Vitals:     05/25/22 0954   BP: 110/70   Pulse: 55   SpO2: 96%   Weight: 110 kg (242 lb)   Height: 1.727 m ('5\' 8"'$ )   BMI: 36.87      Physical Exam  Vitals reviewed.   Constitutional:       Appearance: She is obese.   HENT:      Head: Normocephalic and atraumatic.      Right Ear: Tympanic membrane normal. There is no impacted cerumen.      Left Ear: Tympanic membrane normal. There is no impacted cerumen.      Nose: Nose normal.   Neck:      Vascular: No carotid bruit.   Cardiovascular:      Rate and Rhythm: Normal rate and regular rhythm.  Pulses: Normal pulses.      Heart sounds: Normal heart sounds.   Pulmonary:      Effort: Pulmonary effort is normal.      Breath sounds: Normal breath sounds. No wheezing, rhonchi or rales.   Musculoskeletal:       Right lower leg: No edema.      Left lower leg: No edema.   Lymphadenopathy:      Cervical: No cervical adenopathy.   Skin:     General: Skin is warm and dry.      Capillary Refill: Capillary refill takes less than 2 seconds.   Incision sites are healing with some erythema. No calor. No rebound or guarding  Neurological:      General: No focal deficit present.      Mental Status: She is alert and oriented to person, place, and time.             Feliberto Harts, DO      Portions of this note may be dictated using voice recognition software or a dictation service. Variances in spelling and vocabulary are possible and unintentional. Not all errors are caught/corrected. Please notify the [facility] if any discrepancies are noted or if the meaning of any statement is not clear.

## 2022-08-25 NOTE — Assessment & Plan Note (Signed)
Continue trazodone 300 mg po at night.  Don't use flexeril at the same time as the trazodone

## 2022-08-25 NOTE — Assessment & Plan Note (Signed)
I have asked her to continue taking her Reglan, bethanechol, ppi, and famotidine for the next 4 weeks postoperatively.  I would like to see her wean off 1st the Reglan and the bethanechol.  Following this, I would like her to decrease her PPI to once daily and continue with the famotidine at night.  If she still is able to have relief from her heartburn symptoms, I would like her to reduce the famotidine and the PPI to as-needed doses

## 2022-08-26 LAB — HGA1C (HEMOGLOBIN A1C WITH EST AVG GLUCOSE): HEMOGLOBIN A1C: 5.2 % (ref 4.0–6.0)

## 2022-08-26 LAB — URINE CULTURE,ROUTINE: URINE CULTURE: >100000 — AB

## 2022-08-29 NOTE — Progress Notes (Signed)
OB/[facility]N, [place]  [address]  [place]  [address]       Name: Lindsay Crawford MRN:  [id number]   Date: 08/12/2022 Age: 46 y.o.       HPI  Lindsay Crawford presents today with   Chief Complaint   Patient presents with    Post Op      Nursing Notes:   Colon Flattery, LPN  63/01/60 1093  Signed  Post op Dx Laparoscopy Hysteroscopy D&C with Bilateral Tubal Occlusion on 07/30/2022  No problems,   Colon Flattery, LPN     Follow up no complaints considering endometrial ablation    REVIEW OF SYSTEMS :  Pertinent ROS as per HPI    Patient's PMH/PSH/FH/SH/Meds/Allergies were all reviewed and noted in the EPIC chart on today's visit.    Objective:  BP 121/74   Pulse 74   Ht 1.727 m ('5\' 8"'$ )   Wt 108 kg (239 lb)   LMP 07/21/2022   BMI 36.34 kg/m         Physical Exam:    Constitutional: She is oriented. She appears well-developed and well-nourished.     Abdomen: She exhibits no distension. Soft. No tenderness. She has no rebound and no guarding.  Well-healed laparoscopic incision    Psychiatric: She has a normal mood and affect. Her behavior is normal.     Skin:  Normal, no rashes or skin lesions.      Assessment     ICD-10-CM    1. Postoperative follow-up  Z09            Plan:   Operative findings and pathology discussed with patient follow up 3 months would consider endometrial ablation pathology consistent with proliferative endometrium    No orders of the defined types were placed in this encounter.      No follow-ups on file.    Conley Rolls, DO  08/29/2022 15:49      This note was partially generated using MModal Fluency Direct system, and there may be some incorrect words, spellings, and punctuation that were not noted in checking the note before saving.

## 2022-09-04 ENCOUNTER — Other Ambulatory Visit (RURAL_HEALTH_CENTER): Payer: Self-pay | Admitting: Family Medicine

## 2022-09-04 MED ORDER — CEPHALEXIN 500 MG CAPSULE
500.0000 mg | ORAL_CAPSULE | Freq: Four times a day (QID) | ORAL | 0 refills | Status: DC
Start: 2022-09-04 — End: 2022-12-13

## 2022-09-04 NOTE — Result Encounter Note (Signed)
 Augmentin  may not work to get rid of this UTI. I need to repeat her urine as it was resistant to other pcn. Is she symptomatic? Needs keflex  500 mg po qid which I did send in for her.    Other labs were ok and she can continue her current meds.

## 2022-09-08 ENCOUNTER — Telehealth (RURAL_HEALTH_CENTER): Payer: Self-pay | Admitting: Family Medicine

## 2022-09-08 NOTE — Telephone Encounter (Signed)
-----   Message from [place], [place] sent at 09/04/2022  8:49 AM EDT -----  Augmentin may not work to get rid of this UTI. I need to repeat her urine as it was resistant to other pcn. Is she symptomatic? Needs keflex 500 mg po qid which I did send in for her.    Other labs were ok and she can continue her current meds.

## 2022-09-08 NOTE — Telephone Encounter (Signed)
Finally reached patient I had left message for her to call back she did not so I called her back was able to talk with her she is to get keflex take all of that and recheck urine 2 days after last antibiotic so we can make sure urine is clear she voiced understanding/br

## 2022-09-22 ENCOUNTER — Ambulatory Visit: Payer: MEDICAID | Attending: Family Medicine | Admitting: Family Medicine

## 2022-09-22 ENCOUNTER — Ambulatory Visit (RURAL_HEALTH_CENTER): Payer: MEDICAID | Attending: Family Medicine

## 2022-09-22 ENCOUNTER — Other Ambulatory Visit: Payer: Self-pay

## 2022-09-22 DIAGNOSIS — R300 Dysuria: Secondary | ICD-10-CM | POA: Insufficient documentation

## 2022-09-22 LAB — POCT URINE DIPSTICK
GLUCOSE: NEGATIVE
KETONE: NEGATIVE
NITRITE: NEGATIVE
PH: 5.5
SPECIFIC GRAVITY: 1.02
UROBILINOGEN: 1

## 2022-09-22 NOTE — Progress Notes (Signed)
Patient in for labs urine has trace of leukocytes and trace blood sent for culture per dr bowling/br

## 2022-09-24 LAB — URINE CULTURE: URINE CULTURE: 10000 — AB

## 2022-11-11 ENCOUNTER — Other Ambulatory Visit: Payer: Self-pay

## 2022-11-11 ENCOUNTER — Other Ambulatory Visit: Payer: MEDICAID | Attending: OBSTETRICS/GYNECOLOGY | Admitting: OBSTETRICS/GYNECOLOGY

## 2022-11-11 ENCOUNTER — Ambulatory Visit (INDEPENDENT_AMBULATORY_CARE_PROVIDER_SITE_OTHER): Payer: MEDICAID | Admitting: OBSTETRICS/GYNECOLOGY

## 2022-11-11 ENCOUNTER — Other Ambulatory Visit (INDEPENDENT_AMBULATORY_CARE_PROVIDER_SITE_OTHER): Payer: MEDICAID

## 2022-11-11 ENCOUNTER — Encounter (INDEPENDENT_AMBULATORY_CARE_PROVIDER_SITE_OTHER): Payer: Self-pay | Admitting: OBSTETRICS/GYNECOLOGY

## 2022-11-11 VITALS — BP 124/73 | HR 80 | Ht 68.0 in | Wt 241.2 lb

## 2022-11-11 DIAGNOSIS — D251 Intramural leiomyoma of uterus: Secondary | ICD-10-CM

## 2022-11-11 DIAGNOSIS — N921 Excessive and frequent menstruation with irregular cycle: Secondary | ICD-10-CM

## 2022-11-11 DIAGNOSIS — N898 Other specified noninflammatory disorders of vagina: Secondary | ICD-10-CM | POA: Insufficient documentation

## 2022-11-11 LAB — WETMOUNT
CLUE CELLS: ABSENT
TRICHOMONAS: ABSENT
YEAST: ABSENT

## 2022-11-11 NOTE — Nursing Note (Signed)
3 month follow up  Had a diagnostic laparoscopy hysteroscopy D&C 07/30/22  She had discussed endometrial ablation last visit, she says if possible she would like to do the procedure  She states her periods are still very heavy, experiencing clots and gushes with them, severe cramping, irregular cycle, sometimes having 2 periods in one month. No pain with intercourse.  [name], LPN

## 2022-11-11 NOTE — Progress Notes (Signed)
OB/GYN, [place]  [address]  [place]  [address]       Name: Lindsay Crawford MRN:  [id number]   Date: 11/11/2022 Age: 46 y.o.       HPI  EMONY DORMER presents today with   Chief Complaint   Patient presents with    Irregular Bleeding    Follow Up 3 Months      Nursing Notes:   [name], LPN  31/54/00 8676  Signed  3 month follow up  Had a diagnostic laparoscopy hysteroscopy D&C 07/30/22  She had discussed endometrial ablation last visit, she says if possible she would like to do the procedure  She states her periods are still very heavy, experiencing clots and gushes with them, severe cramping, irregular cycle, sometimes having 2 periods in one month. No pain with intercourse.  Summer Price, LPN     Follow up she would diagnostic laparoscopy bilateral salpingectomy hysteroscopy D&C in the 1st of September she was still having heavy periods large clots gush of blood some mild cramping    REVIEW OF SYSTEMS :  Pertinent ROS as per HPI    Patient's PMH/PSH/FH/SH/Meds/Allergies were all reviewed and noted in the EPIC chart on today's visit.    Objective:  BP 124/73   Pulse 80   Ht 1.727 m ('5\' 8"'$ )   Wt 109 kg (241 lb 4 oz)   LMP 10/31/2022 (Approximate)   BMI 36.68 kg/m         Physical Exam:    Constitutional: She is oriented. She appears well-developed and well-nourished.     Abdomen: She exhibits no distension. Soft. No tenderness. She has no rebound and no guarding.     Psychiatric: She has a normal mood and affect. Her behavior is normal.     Skin:  Normal, no rashes or skin lesions.    GU:  External Exam: External exam normal.    No urethral tenderness.  No genital lesions present.    Vaginal Exam:  Homogeneous discharge   Bladder:  Bladder is normal to exam. Suprapubic tenderness not present.    Uterus: Uterus is normal to exam.  Retroverted top-normal size Cervix is normal to exam.  Multiparous  Contour:  Regular.      Adnexa:  No palpable masses and no tenderness.        Assessment      ICD-10-CM    1. Menorrhagia with irregular cycle  N92.1 US PELVIS NON OB TRANSVAGINAL     76830 - ULTRASOUND, TRANSVAGINAL (AMB ONLY)      2. Fibroids, intramural  D25.1       3. Vaginal discharge  N89.8 WET[place]           Plan:   Wet prep was obtained transvaginal ultrasound was performed described I have discussed managed I have scheduled diagnostic hysteroscopy D&C with NovaSure endometrial ablation for the 19th of January she will follow up in the 16th of January for preop appointment    Orders Placed This Encounter    [phone] - ULTRASOUND, TRANSVAGINAL (AMB ONLY)    [place]    US PELVIS NON OB TRANSVAGINAL       Return in 1 month (on 12/14/2022) for Pre Op.    Conley Rolls, DO  11/11/2022 10:41      This note was partially generated using MModal Fluency Direct system, and there may be some incorrect words, spellings, and punctuation that were not noted in checking the note before saving.

## 2022-11-11 NOTE — Procedures (Signed)
OB/[facility]N, [place]  [address]  [place] [address]    Procedure Note    Name: Lindsay Crawford MRN:  [id number]   Date: 11/11/2022 Age: 46 y.o.  DOB:   1976/05/19       84536 - ULTRASOUND, TRANSVAGINAL (AMB ONLY)    Performed by: Conley Rolls, DO  Authorized by: Conley Rolls, DO    Time Out:     Immediately before the procedure, a time out was called:  Yes    Patient verified:  Yes    Procedure Verified:  Yes    Site Verified:  Yes  Documentation:      Ultrasound  report with measurements reviewed. & signed. Results scanned to imaging order.   Interpretation:  Reviewed and discussed with the patient endometrial lining of 10.3 mm solid mass of the uterus measured 13.6 mm consistent with a leiomyoma          Conley Rolls, DO

## 2022-12-13 ENCOUNTER — Other Ambulatory Visit: Payer: MEDICAID | Attending: OBSTETRICS/GYNECOLOGY

## 2022-12-13 ENCOUNTER — Ambulatory Visit (INDEPENDENT_AMBULATORY_CARE_PROVIDER_SITE_OTHER): Payer: MEDICAID | Admitting: OBSTETRICS/GYNECOLOGY

## 2022-12-13 ENCOUNTER — Encounter (INDEPENDENT_AMBULATORY_CARE_PROVIDER_SITE_OTHER): Payer: Self-pay | Admitting: OBSTETRICS/GYNECOLOGY

## 2022-12-13 ENCOUNTER — Other Ambulatory Visit: Payer: Self-pay

## 2022-12-13 VITALS — BP 125/82 | HR 88 | Resp 18 | Ht 68.0 in | Wt 240.8 lb

## 2022-12-13 DIAGNOSIS — N921 Excessive and frequent menstruation with irregular cycle: Secondary | ICD-10-CM

## 2022-12-13 DIAGNOSIS — D251 Intramural leiomyoma of uterus: Secondary | ICD-10-CM

## 2022-12-13 DIAGNOSIS — Z01818 Encounter for other preprocedural examination: Secondary | ICD-10-CM

## 2022-12-13 LAB — URINALYSIS, MACROSCOPIC
BILIRUBIN: NEGATIVE mg/dL
BLOOD: NEGATIVE mg/dL
GLUCOSE: NEGATIVE mg/dL
KETONES: NEGATIVE mg/dL
LEUKOCYTES: 75 WBCs/uL — AB
PH: 7 (ref 5.0–9.0)
PROTEIN: 10 mg/dL
SPECIFIC GRAVITY: 1.019 (ref 1.002–1.030)
UROBILINOGEN: NORMAL mg/dL

## 2022-12-13 LAB — URINALYSIS, MICROSCOPIC
RBCS: 4 /hpf — ABNORMAL HIGH (ref <4)
SQUAMOUS EPITHELIAL: 17 /hpf (ref <28)
WBCS: 6 /hpf — ABNORMAL HIGH (ref <6)

## 2022-12-13 LAB — CBC
HCT: 40.4 % (ref 31.2–41.9)
HGB: 13.9 g/dL (ref 10.9–14.3)
MCH: 29.6 pg (ref 24.7–32.8)
MCHC: 34.5 g/dL (ref 32.3–35.6)
MCV: 85.9 fL (ref 75.5–95.3)
MPV: 7.8 fL — ABNORMAL LOW (ref 7.9–10.8)
PLATELETS: 278 10*3/uL (ref 140–440)
RBC: 4.7 10*6/uL (ref 3.63–4.92)
RDW: 13.1 % (ref 12.3–17.7)
WBC: 9.1 10*3/uL (ref 3.8–11.8)

## 2022-12-13 LAB — HCG, URINE QUALITATIVE, PREGNANCY: HCG URINE QUALITATIVE: NEGATIVE

## 2022-12-13 NOTE — Nursing Note (Signed)
Pre Op  Surgery is Friday 12/17/22, having a Hysteroscopy with Irving Copas sure ablation.  Consent signed  [name], LPN

## 2022-12-14 ENCOUNTER — Ambulatory Visit (INDEPENDENT_AMBULATORY_CARE_PROVIDER_SITE_OTHER): Payer: MEDICAID | Admitting: OBSTETRICS/GYNECOLOGY

## 2022-12-14 ENCOUNTER — Other Ambulatory Visit: Payer: Self-pay

## 2022-12-15 ENCOUNTER — Encounter (INDEPENDENT_AMBULATORY_CARE_PROVIDER_SITE_OTHER): Payer: Self-pay | Admitting: OBSTETRICS/GYNECOLOGY

## 2022-12-15 NOTE — Progress Notes (Signed)
[facility], [place]  [address]  [place] [address]       Name: Lindsay Crawford MRN:  [id number]   Date: 12/13/2022 Age: 47 y.o.       HPI  Lindsay Crawford presents today with   Chief Complaint   Patient presents with    Pre-op      Nursing Notes:   [name], LPN  16/38/45 [id number]  Signed  Pre Op  Surgery is Friday 12/17/22, having a Hysteroscopy with Irving Copas sure ablation.  Consent signed  Summer Price, LPN     Follow up    REVIEW OF SYSTEMS :  Pertinent ROS as per HPI    Patient's PMH/PSH/FH/SH/Meds/Allergies were all reviewed and noted in the EPIC chart on today's visit.    Objective:  BP 125/82   Pulse 88   Resp 18   Ht 1.727 m ('5\' 8"'$ )   Wt 109 kg (240 lb 12.8 oz)   LMP 11/29/2022 (Exact Date)   SpO2 99%   BMI 36.61 kg/m         Physical Exam:    Constitutional: She is oriented. She appears well-developed and well-nourished.     Abdomen: She exhibits no distension. Soft. No tenderness. She has no rebound and no guarding.     Psychiatric: She has a normal mood and affect. Her behavior is normal.     Skin:  Normal, no rashes or skin lesions.    GU:  External Exam: External exam normal.    No urethral tenderness.  No genital lesions present.    Vaginal Exam:  Clear   Bladder:  Bladder is normal to exam. Suprapubic tenderness not present.    Uterus: Uterus is normal to exam.  Cervix is normal to exam.  Contour:  Regular.      Adnexa:  No palpable masses and no tenderness.        Assessment     ICD-10-CM    1. Menorrhagia with irregular cycle  N92.1 CBC     HCG, URINE QUALITATIVE, PREGNANCY     URINALYSIS, MACROSCOPIC AND MICROSCOPIC      2. Fibroids, intramural  D25.1 CBC     HCG, URINE QUALITATIVE, PREGNANCY     URINALYSIS, MACROSCOPIC AND MICROSCOPIC           Plan:   Options of management discussed with the patient scheduled for diagnostic hysteroscopy D&C with possible NovaSure endometrial ablation for the 19th of January informed consent    Orders Placed This Encounter    CBC    HCG, URINE  QUALITATIVE, PREGNANCY    URINALYSIS, MACROSCOPIC AND MICROSCOPIC       Return in 2 weeks (on 12/27/2022) for post op.    Conley Rolls, DO  12/15/2022 12:01      This note was partially generated using MModal Fluency Direct system, and there may be some incorrect words, spellings, and punctuation that were not noted in checking the note before saving.

## 2022-12-15 NOTE — H&P (Signed)
Preoperative Visit History and Physical      DATE OF SERVICE: 12/15/2022, 12:02   PATIENT: Lindsay Crawford  DOB: Aug 15, 1976   CHART NUMBER: [id number]      HPI  This is a 47 y.o. female who presents for pre-op visit for  for diagnostic hysteroscopy D&C with NovaSure ablation for evaluation of menometrorrhagia        Patient's last menstrual period was 11/29/2022 (exact date).  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    Knee pain, right    Obesity (BMI 35.0-39.9 without comorbidity)    Borderline diabetes    Irritable bowel syndrome with constipation    DUB (dysfunctional uterine bleeding)    Gastroparesis    Hyperlipidemia LDL goal <70    Insomnia    Vitamin D deficiency    Paraspinal muscle spasm    Migraine    GERD (gastroesophageal reflux disease)    Pelvic pain    Menorrhagia with irregular cycle    S/P Nissen fundoplication (without gastrostomy tube) procedure    Vaginal discharge    Fibroids, intramural       Past Medical History:   Diagnosis Date    Body mass index 35.0-35.9, adult     Borderline diabetes     Chronic low back pain     Dizziness     DUB (dysfunctional uterine bleeding)     Gastroparesis     GERD (gastroesophageal reflux disease)     Hyperglycemia     Hyperlipidemia LDL goal <70     Insomnia     Knee pain, bilateral     Migraine     Paraspinal muscle spasm     Vitamin D deficiency            Past Surgical History:   Procedure Laterality Date    CESAREAN SECTION  2004    COLONOSCOPY      Dr. Lilia Pro in [place] / EGD      HX APPENDECTOMY      HX BACK SURGERY      HX LAP CHOLECYSTECTOMY      LAPAROSCOPIC TUBAL LIGATION  07/30/2022    with D&C brodmik    NISSEN FUNDOPLICATION  56/38/7564    had in [place]           Social History     Tobacco Use    Smoking status: Never    Smokeless tobacco: Never   Vaping Use    Vaping Use: Never used   Substance Use Topics     Alcohol use: Never    Drug use: Never       Allergies   Allergen Reactions    Other Rash     Allergic to the sun         Physical Exam  Vitals:    12/13/22 1209   BP: 125/82   Pulse: 88   Resp: 18   SpO2: 99%   Weight: 109 kg (240 lb 12.8 oz)   Height: 1.727 m ('5\' 8"'$ )   BMI: 36.69           General:  NAD, well-appearing  HEENT:  Atraumatic, normocephalic, MMM  Resp:  CTAB  CV:  RRR, no m/r/g  Abdo:  NTND  Ext:  No cyanosis or edema  Neuro:  CN II-XII grossly intact  Skin:  No apparent rashes  or lesions  Vulvovaginal is clear cervix multiparous uterus is top-normal size adnexa is nontender without masses  A/P: Lindsay Crawford is a 47 y.o. who presents today for     1. Pre-operative Visit   - Plan for diagnostic hysteroscopy D&C with NovaSure ablation due to menometrorrhagia uterine hypertrophy  - Surgical consent reviewed and signed with patient.   - All risks, benefits, and alternatives were discussed.  The patient wishes to proceed.  Pre-op labs and DOS orders placed.  NPO post midnight discussed the night before surgery, no ASA, NSAIDS, or herbal drugs between now and her surgery.  - Continues to be appropriate surgical candidate  - Medications to take on AM of surgery:  Per anesthesia no further workup necessary  - Post-operative instructions reviewed.    Conley Rolls, DO  12/15/2022 12:02

## 2022-12-17 ENCOUNTER — Ambulatory Visit (HOSPITAL_COMMUNITY): Payer: MEDICAID | Admitting: Student in an Organized Health Care Education/Training Program

## 2022-12-17 ENCOUNTER — Other Ambulatory Visit: Payer: Self-pay

## 2022-12-17 ENCOUNTER — Inpatient Hospital Stay
Admission: RE | Admit: 2022-12-17 | Discharge: 2022-12-17 | Disposition: A | Discharge status: Home / Self Care | Payer: MEDICAID | Source: Ambulatory Visit | Attending: OBSTETRICS/GYNECOLOGY | Admitting: OBSTETRICS/GYNECOLOGY

## 2022-12-17 ENCOUNTER — Encounter (HOSPITAL_COMMUNITY)
Admission: RE | Disposition: A | Discharge status: Home / Self Care | Payer: Self-pay | Source: Ambulatory Visit | Attending: OBSTETRICS/GYNECOLOGY

## 2022-12-17 DIAGNOSIS — Z9889 Other specified postprocedural states: Secondary | ICD-10-CM | POA: Insufficient documentation

## 2022-12-17 DIAGNOSIS — K219 Gastro-esophageal reflux disease without esophagitis: Secondary | ICD-10-CM | POA: Insufficient documentation

## 2022-12-17 DIAGNOSIS — Z6836 Body mass index (BMI) 36.0-36.9, adult: Secondary | ICD-10-CM | POA: Insufficient documentation

## 2022-12-17 DIAGNOSIS — E785 Hyperlipidemia, unspecified: Secondary | ICD-10-CM | POA: Insufficient documentation

## 2022-12-17 DIAGNOSIS — E669 Obesity, unspecified: Secondary | ICD-10-CM | POA: Insufficient documentation

## 2022-12-17 DIAGNOSIS — N921 Excessive and frequent menstruation with irregular cycle: Secondary | ICD-10-CM | POA: Insufficient documentation

## 2022-12-17 DIAGNOSIS — R519 Headache, unspecified: Secondary | ICD-10-CM | POA: Insufficient documentation

## 2022-12-17 DIAGNOSIS — Z79899 Other long term (current) drug therapy: Secondary | ICD-10-CM | POA: Insufficient documentation

## 2022-12-17 DIAGNOSIS — R9389 Abnormal findings on diagnostic imaging of other specified body structures: Secondary | ICD-10-CM | POA: Insufficient documentation

## 2022-12-17 SURGERY — HYSTEROSCOPY WITH ENDOMETRIAL ABLATION
Anesthesia: General | Site: Vagina | Wound class: Clean Contaminated Wounds-The respiratory, GI, Genital, or urinary

## 2022-12-17 MED ORDER — IBUPROFEN 800 MG TABLET
800.0000 mg | ORAL_TABLET | Freq: Four times a day (QID) | ORAL | Status: DC | PRN
Start: 2022-12-17 — End: 2022-12-17

## 2022-12-17 MED ORDER — ONDANSETRON HCL (PF) 4 MG/2 ML INJECTION SOLUTION
INTRAMUSCULAR | Status: AC
Start: 2022-12-17 — End: 2022-12-17
  Filled 2022-12-17: qty 2

## 2022-12-17 MED ORDER — FENTANYL (PF) 50 MCG/ML INJECTION SOLUTION
INTRAMUSCULAR | Status: AC
Start: 2022-12-17 — End: 2022-12-17
  Filled 2022-12-17: qty 2

## 2022-12-17 MED ORDER — MIDAZOLAM 5 MG/ML INJECTION WRAPPER
2.0000 mg | Freq: Once | INTRAMUSCULAR | Status: DC | PRN
Start: 2022-12-17 — End: 2022-12-17
  Administered 2022-12-17: 2 mg via INTRAVENOUS

## 2022-12-17 MED ORDER — FAMOTIDINE (PF) 20 MG/2 ML INTRAVENOUS SOLUTION
INTRAVENOUS | Status: AC
Start: 2022-12-17 — End: 2022-12-17
  Filled 2022-12-17: qty 2

## 2022-12-17 MED ORDER — CEFAZOLIN 1 GRAM SOLUTION FOR INJECTION
INTRAMUSCULAR | Status: AC
Start: 2022-12-17 — End: 2022-12-17
  Filled 2022-12-17: qty 20

## 2022-12-17 MED ORDER — FENTANYL (PF) 50 MCG/ML INJECTION WRAPPER
50.0000 ug | INJECTION | INTRAMUSCULAR | Status: DC | PRN
Start: 2022-12-17 — End: 2022-12-17

## 2022-12-17 MED ORDER — SODIUM CHLORIDE 0.9 % INTRAVENOUS PIGGYBACK
INJECTION | INTRAVENOUS | Status: AC
Start: 2022-12-17 — End: 2022-12-17
  Filled 2022-12-17: qty 100

## 2022-12-17 MED ORDER — SODIUM CHLORIDE 0.9 % (FLUSH) INJECTION SYRINGE
3.0000 mL | INJECTION | INTRAMUSCULAR | Status: DC | PRN
Start: 2022-12-17 — End: 2022-12-17

## 2022-12-17 MED ORDER — ONDANSETRON HCL (PF) 4 MG/2 ML INJECTION SOLUTION
4.0000 mg | Freq: Once | INTRAMUSCULAR | Status: AC
Start: 2022-12-17 — End: 2022-12-17
  Administered 2022-12-17: 4 mg via INTRAVENOUS

## 2022-12-17 MED ORDER — HYDROCODONE 10 MG-ACETAMINOPHEN 325 MG TABLET
1.0000 | ORAL_TABLET | ORAL | Status: DC | PRN
Start: 2022-12-17 — End: 2022-12-17
  Administered 2022-12-17: 1 via ORAL
  Filled 2022-12-17: qty 1

## 2022-12-17 MED ORDER — LACTATED RINGERS INTRAVENOUS SOLUTION
INTRAVENOUS | Status: DC
Start: 2022-12-17 — End: 2022-12-17

## 2022-12-17 MED ORDER — ALBUTEROL SULFATE 2.5 MG/3 ML (0.083 %) SOLUTION FOR NEBULIZATION
2.5000 mg | INHALATION_SOLUTION | Freq: Once | RESPIRATORY_TRACT | Status: DC | PRN
Start: 2022-12-17 — End: 2022-12-17

## 2022-12-17 MED ORDER — FENTANYL (PF) 50 MCG/ML INJECTION WRAPPER
INJECTION | Freq: Once | INTRAMUSCULAR | Status: DC | PRN
Start: 2022-12-17 — End: 2022-12-17
  Administered 2022-12-17 (×2): 100 ug via INTRAVENOUS

## 2022-12-17 MED ORDER — ONDANSETRON HCL (PF) 4 MG/2 ML INJECTION SOLUTION
4.0000 mg | INTRAMUSCULAR | Status: DC | PRN
Start: 2022-12-17 — End: 2022-12-17

## 2022-12-17 MED ORDER — ROCURONIUM 10 MG/ML INTRAVENOUS SOLUTION
Freq: Once | INTRAVENOUS | Status: DC | PRN
Start: 2022-12-17 — End: 2022-12-17
  Administered 2022-12-17: 30 mg via INTRAVENOUS

## 2022-12-17 MED ORDER — MORPHINE 2 MG/ML INJECTION WRAPPER
2.0000 mg | INJECTION | INTRAMUSCULAR | Status: DC | PRN
Start: 2022-12-17 — End: 2022-12-17

## 2022-12-17 MED ORDER — SUCCINYLCHOLINE 20 MG/ML INTRAVENOUS WRAPPER
INJECTION | Freq: Once | INTRAVENOUS | Status: DC | PRN
Start: 2022-12-17 — End: 2022-12-17
  Administered 2022-12-17: 160 mg via INTRAVENOUS

## 2022-12-17 MED ORDER — CEFAZOLIN 1 GRAM SOLUTION FOR INJECTION
Freq: Once | INTRAMUSCULAR | Status: DC | PRN
Start: 2022-12-17 — End: 2022-12-17
  Administered 2022-12-17: 2000 mg via INTRAVENOUS

## 2022-12-17 MED ORDER — MIDAZOLAM 5 MG/ML INJECTION WRAPPER
INTRAMUSCULAR | Status: AC
Start: 2022-12-17 — End: 2022-12-17
  Filled 2022-12-17: qty 1

## 2022-12-17 MED ORDER — PROPOFOL 10 MG/ML IV BOLUS
INJECTION | Freq: Once | INTRAVENOUS | Status: DC | PRN
Start: 2022-12-17 — End: 2022-12-17
  Administered 2022-12-17: 250 mg via INTRAVENOUS

## 2022-12-17 MED ORDER — SUGAMMADEX 100 MG/ML INTRAVENOUS SOLUTION
Freq: Once | INTRAVENOUS | Status: DC | PRN
Start: 2022-12-17 — End: 2022-12-17
  Administered 2022-12-17: 400 mg via INTRAVENOUS

## 2022-12-17 MED ORDER — SODIUM CHLORIDE 0.9 % (FLUSH) INJECTION SYRINGE
3.0000 mL | INJECTION | Freq: Three times a day (TID) | INTRAMUSCULAR | Status: DC
Start: 2022-12-17 — End: 2022-12-17

## 2022-12-17 MED ORDER — PROCHLORPERAZINE EDISYLATE 10 MG/2 ML (5 MG/ML) INJECTION SOLUTION
5.0000 mg | Freq: Once | INTRAMUSCULAR | Status: DC | PRN
Start: 2022-12-17 — End: 2022-12-17

## 2022-12-17 MED ORDER — HYDROCODONE 5 MG-ACETAMINOPHEN 325 MG TABLET
1.0000 | ORAL_TABLET | ORAL | Status: DC | PRN
Start: 2022-12-17 — End: 2022-12-17

## 2022-12-17 MED ORDER — IBUPROFEN 800 MG TABLET
800.0000 mg | ORAL_TABLET | Freq: Four times a day (QID) | ORAL | 1 refills | Status: DC | PRN
Start: 2022-12-17 — End: 2023-04-27

## 2022-12-17 MED ORDER — ONDANSETRON HCL (PF) 4 MG/2 ML INJECTION SOLUTION
Freq: Once | INTRAMUSCULAR | Status: DC | PRN
Start: 2022-12-17 — End: 2022-12-17
  Administered 2022-12-17: 4 mg via INTRAVENOUS

## 2022-12-17 MED ORDER — IPRATROPIUM 0.5 MG-ALBUTEROL 3 MG (2.5 MG BASE)/3 ML NEBULIZATION SOLN
3.0000 mL | INHALATION_SOLUTION | Freq: Once | RESPIRATORY_TRACT | Status: DC | PRN
Start: 2022-12-17 — End: 2022-12-17

## 2022-12-17 MED ORDER — HYDROCODONE 5 MG-ACETAMINOPHEN 325 MG TABLET
1.0000 | ORAL_TABLET | ORAL | 0 refills | Status: DC | PRN
Start: 2022-12-17 — End: 2023-02-08

## 2022-12-17 MED ORDER — DEXAMETHASONE SODIUM PHOSPHATE 4 MG/ML INJECTION SOLUTION
4.0000 mg | Freq: Once | INTRAMUSCULAR | Status: AC
Start: 2022-12-17 — End: 2022-12-17
  Administered 2022-12-17: 4 mg via INTRAVENOUS

## 2022-12-17 MED ORDER — DEXAMETHASONE SODIUM PHOSPHATE 4 MG/ML INJECTION SOLUTION
INTRAMUSCULAR | Status: AC
Start: 2022-12-17 — End: 2022-12-17
  Filled 2022-12-17: qty 1

## 2022-12-17 MED ORDER — LIDOCAINE (PF) 100 MG/5 ML (2 %) INTRAVENOUS SYRINGE
INJECTION | Freq: Once | INTRAVENOUS | Status: DC | PRN
Start: 2022-12-17 — End: 2022-12-17
  Administered 2022-12-17 (×2): 100 mg via INTRAVENOUS

## 2022-12-17 MED ORDER — FAMOTIDINE (PF) 20 MG/2 ML INTRAVENOUS SOLUTION
20.0000 mg | Freq: Once | INTRAVENOUS | Status: AC
Start: 2022-12-17 — End: 2022-12-17
  Administered 2022-12-17: 20 mg via INTRAVENOUS

## 2022-12-17 MED ORDER — MIDAZOLAM 5 MG/ML INJECTION WRAPPER
Freq: Once | INTRAMUSCULAR | Status: DC | PRN
Start: 2022-12-17 — End: 2022-12-17
  Administered 2022-12-17: 5 mg via INTRAVENOUS

## 2022-12-17 MED ORDER — ONDANSETRON HCL (PF) 4 MG/2 ML INJECTION SOLUTION
4.0000 mg | Freq: Once | INTRAMUSCULAR | Status: DC | PRN
Start: 2022-12-17 — End: 2022-12-17

## 2022-12-17 MED ORDER — FENTANYL (PF) 50 MCG/ML INJECTION WRAPPER
25.0000 ug | INJECTION | INTRAMUSCULAR | Status: DC | PRN
Start: 2022-12-17 — End: 2022-12-17

## 2022-12-17 SURGICAL SUPPLY — 23 items
BAG SUT DVN STRL LF (SUTURE/WOUND CLOSURE) ×1 IMPLANT
CATH URETH CLEAN 14FR 16IN UNIV INTMT SMOOTH TIP FNL END STGR EYE VNYL STRL LF  CLR (UROLOGICAL SUPPLIES) ×1 IMPLANT
CLEANER INSTR PREPZYME MUL-TRD CONTAINR NARSL NEUT PH BDGR 22OZ (MISCELLANEOUS PT CARE ITEMS) ×1
CONN TUBING 6-15MM Y POLYPROP SHTRPRF STRL LF  DISP (MED SURG SUPPLIES) ×1 IMPLANT
CONTAINR HISTO C90ML 10% NEUT BF FRMLN POLYPROP PREFL 60ML (MISCELLANEOUS PT CARE ITEMS) ×1 IMPLANT
CONV USE ITEM 329146 - CLEANER INSTR PREPZYME MUL-TRD CONTAINR NARSL NEUT PH BDGR 22OZ (MISCELLANEOUS PT CARE ITEMS) ×1 IMPLANT
GLOVE SURG 7 LF  PF BEAD CUF STRL CRM 11.8IN PROTEXIS PI PLISPRN THK9.1 MIL (GLOVES AND ACCESSORIES) ×1 IMPLANT
GLOVE SURG 7.5 LF  PF SMOOTH BEAD CUF INTLK STRL BLU 11.8IN PROTEXIS NEU-THERA PLISPRN THK7.9 MIL (GLOVES AND ACCESSORIES) ×1 IMPLANT
GLOVE SURG 7.5 LTX PF SMOOTH BEAD CUF STRL YW 12IN PROTEXIS (GLOVES AND ACCESSORIES) ×1 IMPLANT
GLOVE SURG 8 LF  PF BEAD CUF STRL CRM 11.8IN PROTEXIS PI PLISPRN THK9.1 MIL (GLOVES AND ACCESSORIES) ×1 IMPLANT
GOWN SURG LRG STD LGTH REG L3 NONREINFORCE BRTHBL TWL STRL LF  DISP BLU HALYARD SPECTRUM SMS (DRAPE/PACKS/SHEETS/OR TOWEL) ×1 IMPLANT
GOWN SURG XL STD LGTH L3 NONREINFORCE HKLP CLSR TWL STRL LF  DISP BLU SPECTRUM SMS (DRAPE/PACKS/SHEETS/OR TOWEL) ×2
GOWN SURG XL STD LGTH L3 NONREINFORCE HKLP CLSR TWL STRL LF DISP BLU SPECTRUM SMS (DRAPE/PACKS/SHEETS/OR TOWEL) ×2 IMPLANT
KIT UTER NOVASURE V5 (MED SURG SUPPLIES) ×1 IMPLANT
PACK SURG SIRUS OB III REINF TBL CVR GRAD UNDERBUTTOCK STRL 76X44IN 46X33.5IN POLY LF (MED SURG SUPPLIES) ×1 IMPLANT
PAD DRESS 8X3IN MDCHC NONADH NWVN LF  STRL DISP WHT (WOUND CARE SUPPLY) ×1 IMPLANT
SET ENDOS INSTR MYOSURE SEAL HYSCP OFLW CHNL DISP (ENDOSCOPIC SUPPLIES) ×1 IMPLANT
SLEEVE COMPRESS MED KNEE LGTH KENDALL SCD SEQ NONST LF  DISP 21- IN DVT PE (MED SURG SUPPLIES) ×1 IMPLANT
SOL IRRG 0.9% NACL 2000ML PRSV FR N-PYRG FLXB CONTAINR STRL (MEDICATIONS/SOLUTIONS) ×1
SOL IRRG 0.9% NACL 2000ML PRSV FR N-PYRG FLXB CONTAINR STRL LF (MEDICATIONS/SOLUTIONS) ×1 IMPLANT
SPONGE SURG 4X4IN 16 PLY XRY DETECT COTTON STRL LF  DISP (WOUND CARE SUPPLY) ×1 IMPLANT
TOWEL 24X16IN COTTON BLU DISP SURG STRL LF (DRAPE/PACKS/SHEETS/OR TOWEL) ×1 IMPLANT
TUBING IRRG 3MR STRL LF  DISP (ENDOSCOPIC SUPPLIES) ×1 IMPLANT

## 2022-12-17 NOTE — Discharge Instructions (Addendum)
Follow up with Dr Sherlene Shams on Wednesday, December 29, 2022 at 9:20 a.m.    Change peri pad as needed    Call for increased pain, temperature greater than 101 F, bleeding greater than 1 pad/hour, foul smelling discharge, increased pain.    Pelvic rest until seen at follow up appointment. No sex, tampons, douches, tub baths, swimming or hot tubs.    Call for problems, questions, concerns.    Resume home meds.    RX for home sent in to your pharmacy.

## 2022-12-17 NOTE — OR Surgeon (Signed)
[facility]  OPERATIVE REPORT    Patient Name: Lindsay Crawford Number: [id number]  Date of Service: 12/17/2022  Date of Birth: 1976-01-15      Pre-Operative Diagnosis: MENOMETRORRHAGIA    Post-Operative Diagnosis: MENOMETRORRHAGIA    Procedure(s)/Description:  Procedure(s):  HYSTEROSCOPY; DILATION AND CURETTGAE; NOVASURE ENDOMETRIAL ABLATION    Findings/Complexity (inherent to the procedure performed):    1. Examination anesthesia multiparous cervix uterus sounded to 11 cm diagnostic hysteroscopy revealed thickening of the anterior posterior uterine wall there was no polyps or submucosal leiomyoma noted the NovaSure impedance controlled catheter was placed and was setting to 6 cm x 3.6 cm 119 w of coagulation current was delivered in 70 seconds inspection of the cavity complete obliteration endometrial cavity     Attending Surgeon: Conley Rolls, DO FACOG    Assistant(s):  None    Anesthesia Type: General    Estimated Blood Loss:  25 cc    Fluids Given:  100 ml Crystalloid    Complications:  None  Tubes: None  Drains: None  Specimens/ Cultures: None           Disposition: PACU - hemodynamically stable.  Condition: stable      Description of Procedure:    After informed consent was obtained the patient was taken to the operating room and placed on the operating table in the dorsal lithotomy position with care taken to avoid excessive hip or knee adduction or abduction. Sequential compression devices were placed on the patient's lower extremities and activated prior to the induction of anesthesia. General anesthesia was induced without difficulty. A surgical pause was performed identifying the patient and procedure to be done and all were in agreement.     The vagina was prepped and draped in the usual sterile fashion. The bladder was drained with a red rubber catheter prior to the procedure. An open sided graves speculum was inserted into the vagina.  A single toothed tenaculum was used to grasp the  anterior lip of the cervix. The uterus was sounded to 11 cm. The cervix was serially dilated using 8.Hegar cervical dilators. A sharp curettage was then performed and tissue was sent to pathology.  The hysteroscope was inserted into the uterus and the endometrium contained blood and was fluffy. The hysteroscope was then removed.     Attention was then turned to Novasure portion of the procedure. Uterus width measured 3.6 cm, and cavity length measured 6 cm. The endometrium was ablated for a total of 70 seconds at '11 19 1 '$ power. After the ablation was performed, the hysteroscope was again inserted and the uterine lining appeared to be well charred.  Complete obliteration of the cavity some small sparing at apex     All instruments were removed from the patient's vagina. The tenaculum site was noted to be hemostatic. All counts were correct x 2 at the end of the procedure.     Conley Rolls, DO  12/17/2022 12:02      This note was partially generated using MModal Fluency Direct system, and there may be some incorrect words, spellings, and punctuation that were not noted in checking the note before saving.

## 2022-12-17 NOTE — Anesthesia Procedure Notes (Signed)
Lindsay Crawford    Airway Note  General Information and Staff   Authorizing provider: Lavena Stanford, DO  Performing provider: Jasmine December, CRNA        Urgency: elective    Airway not difficult    Indications and Patient Condition  Pt location: In Or  Indications for airway management: anesthesia  Spontaneous ventilation: present  Sedation level: deep  Preoxygenated: yes  Patient position: sniffing  Mask difficulty assessment: 2 - vent by mask + OA or adjuvant +/- NMBA        Final Airway Details  Final airway type: endotracheal airway        Successful airway: ETT     Successful intubation technique: video laryngoscopy  Facilitating devices/methods: intubating stylet  Video scope brand: GlideScope     Bullard Type: Adult     ILMA Procedure :Standard blade  Blade: Macintosh  Blade size: #3  Airway size (mm): 7.5  Cormack-Lehane Classification: grade IIa - partial view of glottis  Placement verified by: chest auscultation and capnometry   Marked at 21  Measured from: lips  Secured with: Tape  Number of attempts at approach: 1Airway complications: Atraumatic  Additional Comments  Deviated to the left

## 2022-12-17 NOTE — Anesthesia Postprocedure Evaluation (Signed)
Anesthesia Post Op Evaluation    Patient: Lindsay Crawford  Procedure(s):  HYSTEROSCOPY; DILATION AND CURETTGAE; NOVASURE ENDOMETRIAL ABLATION    Last Vitals:Temperature: 36.2 C (97.2 F) (12/17/22 1129)  Heart Rate: 78 (12/17/22 1155)  BP (Non-Invasive): 138/78 (12/17/22 1155)  Respiratory Rate: 15 (12/17/22 1155)  SpO2: 96 % (12/17/22 1155)    No notable events documented.    Patient is sufficiently recovered from the effects of anesthesia to participate in the evaluation and has returned to their pre-procedure level.  Patient location during evaluation: PACU       Patient participation: complete - patient participated  Level of consciousness: awake and alert and responsive to verbal stimuli    Pain management: adequate  Airway patency: patent    Anesthetic complications: no  Cardiovascular status: acceptable  Respiratory status: acceptable  Hydration status: acceptable  Patient post-procedure temperature: Pt Normothermic   PONV Status: Absent

## 2022-12-17 NOTE — Anesthesia Preprocedure Evaluation (Signed)
ANESTHESIA PRE-OP EVALUATION  Planned Procedure: HYSTEROSCOPY WITH NOVASURE ENDOMETRIAL ABLATION (Vagina )  Review of Systems     anesthesia history negative     patient summary reviewed  nursing notes reviewed        Pulmonary  negative pulmonary ROS,    Cardiovascular    Hyperlipidemia ,No peripheral edema,  Exercise Tolerance: > or = 4 METS        GI/Hepatic/Renal    S/p Nissen , GERD and well controlled        Endo/Other    obesity,      Neuro/Psych/MS    headaches     Cancer                        Physical Assessment      Airway       Mallampati: III    TM distance: >3 FB    Neck ROM: full  Mouth Opening: good.            Dental       Dentition intact             Pulmonary    Breath sounds clear to auscultation  (-) no rhonchi, no decreased breath sounds, no wheezes, no rales and no stridor     Cardiovascular    Rhythm: regular  Rate: Normal  (-) no friction rub, carotid bruit is not present, no peripheral edema and no murmur     Other findings              Plan  ASA 3     Planned anesthesia type: general     general anesthesia with endotracheal tube intubation      PONV Plan:  I plan to administer pharmcologic prophalaxis antiemetics  POV PLAN:   plan for postoperative opioid use            Intravenous induction     Anesthesia issues/risks discussed are: Post-op Pain Management, Stroke, Aspiration, Cardiac Events/MI, Blood Loss, PONV and Sore Throat.  Anesthetic plan and risks discussed with patient  signed consent obtained          Patient's NPO status is appropriate for Anesthesia.           Plan discussed with CRNA.              Urine Pregnancy Results: Negative    Glidescope

## 2022-12-17 NOTE — Anesthesia Transfer of Care (Signed)
ANESTHESIA TRANSFER OF CARE   Lindsay Crawford is a 47 y.o. ,female, Weight: 109 kg (240 lb)   had Procedure(s):  HYSTEROSCOPY; DILATION AND CURETTGAE; NOVASURE ENDOMETRIAL ABLATION  performed  12/17/22   Primary Service: Conley Rolls, DO    Past Medical History:   Diagnosis Date   . Body mass index 35.0-35.9, adult    . Borderline diabetes    . Chronic low back pain    . Dizziness    . DUB (dysfunctional uterine bleeding)    . Gastroparesis    . GERD (gastroesophageal reflux disease)    . Hyperglycemia    . Hyperlipidemia LDL goal <70    . Insomnia    . Knee pain, bilateral    . Migraine    . Paraspinal muscle spasm    . Vitamin D deficiency       Allergy History as of 12/17/22       OTHER         Noted Status Severity Type Reaction    02/03/22 1224 Bowling, [place], DO 03/31/18 Active Medium  Rash    Comments: Allergic to the sun                    I completed my transfer of care / handoff to the receiving personnel during which we discussed:  Access, Airway, All key/critical aspects of case discussed, Analgesia, Antibiotics, Expectation of post procedure, Fluids/Product, Gave opportunity for questions and acknowledgement of understanding, Labs and PMHx      Post Location: PACU                                                           Last OR Temp: Temperature: 36.5 C (97.7 F)  ABG:  POTASSIUM   Date Value Ref Range Status   08/25/2022 3.8 3.5 - 5.1 mmol/L Final     KETONES   Date Value Ref Range Status   12/13/2022 Negative Negative, Trace mg/dL Final     CALCIUM   Date Value Ref Range Status   08/25/2022 9.9 8.6 - 10.3 mg/dL Final     CALCIUM OXALATE CRYSTALS   Date Value Ref Range Status   08/25/2022 Many (A) (none) /hpf Final     Airway:* No LDAs found *  Blood pressure 124/72, pulse 71, temperature 36.5 C (97.7 F), resp. rate 16, height 1.727 m ('5\' 8"'$ ), weight 109 kg (240 lb), SpO2 97%.

## 2022-12-20 DIAGNOSIS — N921 Excessive and frequent menstruation with irregular cycle: Secondary | ICD-10-CM

## 2022-12-20 LAB — SURGICAL PATHOLOGY SPECIMEN

## 2022-12-27 ENCOUNTER — Ambulatory Visit (RURAL_HEALTH_CENTER): Payer: MEDICAID | Attending: Family Medicine | Admitting: Family Medicine

## 2022-12-27 ENCOUNTER — Other Ambulatory Visit: Payer: Self-pay

## 2022-12-27 ENCOUNTER — Encounter (RURAL_HEALTH_CENTER): Payer: Self-pay | Admitting: Family Medicine

## 2022-12-27 VITALS — BP 110/68 | HR 86 | Temp 98.9°F | Ht 68.0 in | Wt 239.0 lb

## 2022-12-27 DIAGNOSIS — E785 Hyperlipidemia, unspecified: Secondary | ICD-10-CM | POA: Insufficient documentation

## 2022-12-27 DIAGNOSIS — E559 Vitamin D deficiency, unspecified: Secondary | ICD-10-CM | POA: Insufficient documentation

## 2022-12-27 DIAGNOSIS — Z1231 Encounter for screening mammogram for malignant neoplasm of breast: Secondary | ICD-10-CM

## 2022-12-27 DIAGNOSIS — F5101 Primary insomnia: Secondary | ICD-10-CM | POA: Insufficient documentation

## 2022-12-27 DIAGNOSIS — Z6836 Body mass index (BMI) 36.0-36.9, adult: Secondary | ICD-10-CM | POA: Insufficient documentation

## 2022-12-27 DIAGNOSIS — G43709 Chronic migraine without aura, not intractable, without status migrainosus: Secondary | ICD-10-CM | POA: Insufficient documentation

## 2022-12-27 DIAGNOSIS — K581 Irritable bowel syndrome with constipation: Secondary | ICD-10-CM | POA: Insufficient documentation

## 2022-12-27 DIAGNOSIS — K219 Gastro-esophageal reflux disease without esophagitis: Secondary | ICD-10-CM | POA: Insufficient documentation

## 2022-12-27 DIAGNOSIS — Z9889 Other specified postprocedural states: Secondary | ICD-10-CM

## 2022-12-27 DIAGNOSIS — R7303 Prediabetes: Secondary | ICD-10-CM | POA: Insufficient documentation

## 2022-12-27 MED ORDER — FERROUS SULFATE 325 MG (65 MG IRON) TABLET
325.0000 mg | ORAL_TABLET | Freq: Every day | ORAL | 3 refills | Status: DC
Start: 2022-12-27 — End: 2023-05-04

## 2022-12-27 MED ORDER — OMEPRAZOLE 40 MG CAPSULE,DELAYED RELEASE
40.0000 mg | DELAYED_RELEASE_CAPSULE | Freq: Two times a day (BID) | ORAL | 3 refills | Status: DC
Start: 2022-12-27 — End: 2022-12-29

## 2022-12-27 MED ORDER — POLYETHYLENE GLYCOL 3350 17 GRAM ORAL POWDER PACKET
17.0000 g | Freq: Every day | ORAL | 11 refills | Status: DC | PRN
Start: 2022-12-27 — End: 2023-02-08

## 2022-12-27 MED ORDER — CETIRIZINE 10 MG TABLET
10.0000 mg | ORAL_TABLET | Freq: Every day | ORAL | 3 refills | Status: DC
Start: 2022-12-27 — End: 2023-04-27

## 2022-12-27 MED ORDER — FAMOTIDINE 40 MG TABLET
40.0000 mg | ORAL_TABLET | Freq: Every evening | ORAL | 3 refills | Status: DC
Start: 2022-12-27 — End: 2023-04-27

## 2022-12-27 MED ORDER — METOCLOPRAMIDE 5 MG TABLET
5.0000 mg | ORAL_TABLET | Freq: Every day | ORAL | 3 refills | Status: DC
Start: 2022-12-27 — End: 2023-04-27

## 2022-12-27 MED ORDER — LINACLOTIDE 145 MCG CAPSULE
145.0000 ug | ORAL_CAPSULE | Freq: Every morning | ORAL | 3 refills | Status: DC
Start: 2022-12-27 — End: 2023-04-27

## 2022-12-27 MED ORDER — TOPIRAMATE 100 MG TABLET
100.0000 mg | ORAL_TABLET | Freq: Two times a day (BID) | ORAL | 3 refills | Status: DC
Start: 2022-12-27 — End: 2023-04-27

## 2022-12-27 MED ORDER — CYCLOBENZAPRINE 10 MG TABLET
10.0000 mg | ORAL_TABLET | Freq: Two times a day (BID) | ORAL | 3 refills | Status: DC
Start: 2022-12-27 — End: 2023-04-27

## 2022-12-27 MED ORDER — QUETIAPINE 100 MG TABLET
100.0000 mg | ORAL_TABLET | Freq: Two times a day (BID) | ORAL | 3 refills | Status: DC
Start: 2022-12-27 — End: 2023-04-27

## 2022-12-27 MED ORDER — ROSUVASTATIN 5 MG TABLET
5.0000 mg | ORAL_TABLET | Freq: Every day | ORAL | 3 refills | Status: DC
Start: 2022-12-27 — End: 2023-04-27

## 2022-12-27 NOTE — Assessment & Plan Note (Signed)
Stop linzess 96mg daily. Increase 145m daily

## 2022-12-27 NOTE — Progress Notes (Signed)
[facility], [place]  [place] [address]  Operated by [facility]     Name: Lindsay Crawford MRN:  [id number]   Date of Birth: [birth year] Age: 47 y.o.   Date: 12/27/2022  Time: 19:16     Provider: Feliberto Harts, DO      Assessment/Plan:  Problem List Items Addressed This Visit          Cardiovascular System    Hyperlipidemia LDL goal <70    Relevant Orders    LIPID PANEL       Neurologic    Insomnia     Wean off trazodone 300. Take 1/2 pill at night for a week and then stop.  Add seroquel 100 mg. Take for a week and then can increase to 200 mg per week.         Relevant Orders    CBC/DIFF    COMPREHENSIVE METABOLIC PANEL, NON-FASTING    THYROID STIMULATING HORMONE (SENSITIVE TSH)    MAGNESIUM    Migraine     Continue topamax            Digestive    Irritable bowel syndrome with constipation     Stop linzess 33mg daily. Increase 1436m daily         GERD (gastroesophageal reflux disease)     Much better s/p nissan fundoplication             Endocrine    Borderline diabetes    Relevant Orders    HGA1C (HEMOGLOBIN A1C WITH EST AVG GLUCOSE)    Vitamin D deficiency    Relevant Orders    VITAMIN D 25 TOTAL       Other    S/P Nissen fundoplication (without gastrostomy tube) procedure     Other Visit Diagnoses       Breast cancer screening by mammogram    -  Primary    Relevant Orders    MAMMO BILATERAL SCREENING    BMI 36.0-36.9,adult                No follow-ups on file.      Orders Placed This Encounter    CBC/DIFF    COMPREHENSIVE METABOLIC PANEL, NON-FASTING    HGA1C (HEMOGLOBIN A1C WITH EST AVG GLUCOSE)    LIPID PANEL    THYROID STIMULATING HORMONE (SENSITIVE TSH)    MAGNESIUM    VITAMIN B12    VITAMIN D 25 TOTAL    IRON TRANSFERRIN AND TIBC    Refer to OB/[facility]N [facility] [place] [facility]    linaCLOtide (LINZESS) 72 mcg Oral Capsule    bethanechol chloride (URECHOLINE) 5 mg Oral Tablet    cetirizine (ZYRTEC) 10 mg Oral Tablet     cholecalciferol, vitamin D3, 1,250 mcg (50,000 unit) Oral Capsule    cyclobenzaprine (FLEXERIL) 10 mg Oral Tablet    famotidine (PEPCID) 40 mg Oral Tablet    metoclopramide HCl (REGLAN) 5 mg Oral Tablet    omeprazole (PRILOSEC) 40 mg Oral Capsule, Delayed Release(E.C.)    rosuvastatin (CRESTOR) 5 mg Oral Tablet    topiramate (TOPAMAX) 100 mg Oral Tablet    TraZODone (DESYREL) 300 mg Oral Tablet              Discontinued Medications     LINACLOTIDE (LINZESS) 145 MCG ORAL CAPSULE    Take 1 Capsule (145 mcg total) by mouth Every morning for 90 days     MELOXICAM (MOBIC) 7.5 MG ORAL  TABLET    Take 1 Tablet (7.5 mg total) by mouth Once a day for 90 days      Reason for visit: Follow Up 6 Months (Having pain in the hip left worse than r/Feels like fluid in ears)        History of Present Illness:  CLELLA MCKEEL is a 47 y.o. female with gastroparesis, GERD, obesity, borderline diabetes, chronic low back pain and DUB presents for chronic disease management.    Follows with Dr. Galvin Proffer and his NP for her bilateral knee pain. Has bursitis of hips as well but can be injected with Dr. Galvin Proffer.    Dr. Lilia Pro in [place] follows her for GI stuff    Had ablation with Dr. Sherlene Shams on 12/14/22. Still having blood tinged mucus. Has follow up with GYN on Wednesday    Gets mammograms at [facility] radiology    Not sleeping even with trazodone 300 mg.       Historical Data       Past Medical History:       Past Medical History:   Diagnosis Date    Body mass index 35.0-35.9, adult      Borderline diabetes      Chronic low back pain      Dizziness      DUB (dysfunctional uterine bleeding)      Gastroparesis      GERD (gastroesophageal reflux disease)      Hyperglycemia      Hyperlipidemia LDL goal <70      Insomnia      Knee pain, bilateral      Migraine      Paraspinal muscle spasm      Vitamin D deficiency              Past Surgical History:        Past Surgical History:   Procedure Laterality Date    CESAREAN SECTION   2004    COLONOSCOPY          Dr. Lilia Pro in [place] / EGD        HX APPENDECTOMY        HX BACK SURGERY        HX LAP CHOLECYSTECTOMY                Allergies:        Allergies   Allergen Reactions    Other Rash       Allergic to the sun       Medications:  medroxyPROGESTERone (PROVERA) 10 mg Oral Tablet, Take 1 Tablet (10 mg total) by mouth Twice daily  polyethylene glycol (MIRALAX) 17 gram/dose Oral Powder, Take 1 g by mouth Once a day  bethanechol chloride (URECHOLINE) 5 mg Oral Tablet, Take 1 Tablet (5 mg total) by mouth Twice daily for 90 days  cetirizine (ZYRTEC) 10 mg Oral Tablet, Take 1 Tablet (10 mg total) by mouth Once a day for 90 days  cholecalciferol, vitamin D3, 1,250 mcg (50,000 unit) Oral Capsule, Take 1 Capsule (50,000 Units total) by mouth Every 7 days for 90 days  cyclobenzaprine (FLEXERIL) 10 mg Oral Tablet, Take 1 Tablet (10 mg total) by mouth Twice daily for 90 days  famotidine (PEPCID) 40 mg Oral Tablet, Take 1 Tablet (40 mg total) by mouth Once a day for 90 days  linaCLOtide (LINZESS) 145 mcg Oral Capsule, Take 1 Capsule (145 mcg total) by mouth Every morning for 90 days  meloxicam (MOBIC) 7.5  mg Oral Tablet, Take 1 Tablet (7.5 mg total) by mouth Once a day for 90 days  metoclopramide HCl (REGLAN) 5 mg Oral Tablet, Take 1 Tablet (5 mg total) by mouth Twice daily for 90 days  omeprazole (PRILOSEC) 40 mg Oral Capsule, Delayed Release(E.C.), Take 1 Capsule (40 mg total) by mouth Once a day for 90 days  rosuvastatin (CRESTOR) 5 mg Oral Tablet, Take 1 Tablet (5 mg total) by mouth Once a day for 90 days  topiramate (TOPAMAX) 100 mg Oral Tablet, Take 1 Tablet (100 mg total) by mouth Twice daily for 90 days  TraZODone (DESYREL) 300 mg Oral Tablet, Take 1 Tablet (300 mg total) by mouth Once a day for 30 days     No facility-administered medications prior to visit.     Family History:       Family Medical History:      Problem Relation (Age of Onset)     COPD Father     Coronary Artery Disease Brother      Diabetes type II Father     Hypertension (High Blood Pressure) Mother, Father     Kidney Disease Father     Throat cancer Sister             Social History:  Social History            Socioeconomic History    Marital status: Widowed    Number of children: 1    Years of education: 12+    Highest education level: Associate degree: occupational, Hotel manager, or vocational program   Tobacco Use    Smoking status: Never    Smokeless tobacco: Never   Vaping Use    Vaping Use: Never used   Substance and Sexual Activity    Alcohol use: Never    Drug use: Never    Sexual activity: Yes       Partners: Male       Birth control/protection: None               Review of Systems:  Any pertinent Review of Systems as addressed in the HPI above.     Physical Exam:  Vital Signs:      Vitals:     05/25/22 0954   BP: 110/70   Pulse: 55   SpO2: 96%   Weight: 110 kg (242 lb)   Height: 1.727 m ('5\' 8"'$ )   BMI: 36.87      Physical Exam  Vitals reviewed.   Constitutional:       Appearance: She is obese.   HENT:      Head: Normocephalic and atraumatic.      Right Ear: Tympanic membrane normal. There is no impacted cerumen.      Left Ear: Tympanic membrane normal. There is no impacted cerumen.      Nose: Nose normal.   Neck:      Vascular: No carotid bruit.   Cardiovascular:      Rate and Rhythm: Normal rate and regular rhythm.      Pulses: Normal pulses.      Heart sounds: Normal heart sounds.   Pulmonary:      Effort: Pulmonary effort is normal.      Breath sounds: Normal breath sounds. No wheezing, rhonchi or rales.   Musculoskeletal:      Right lower leg: No edema.      Left lower leg: No edema.   Lymphadenopathy:      Cervical: No cervical adenopathy.  Skin:     General: Skin is warm and dry.      Capillary Refill: Capillary refill takes less than 2 seconds.   Incision sites are healed.   Neurological:      General: No focal deficit present.      Mental Status: She is alert and oriented to person, place, and time.             Feliberto Harts, DO      Portions of this note may be dictated using voice recognition software or a dictation service. Variances in spelling and vocabulary are possible and unintentional. Not all errors are caught/corrected. Please notify the [facility] if any discrepancies are noted or if the meaning of any statement is not clear.

## 2022-12-27 NOTE — Assessment & Plan Note (Signed)
Continue topamax

## 2022-12-27 NOTE — Assessment & Plan Note (Signed)
Wean off trazodone 300. Take 1/2 pill at night for a week and then stop.  Add seroquel 100 mg. Take for a week and then can increase to 200 mg per week.

## 2022-12-27 NOTE — Assessment & Plan Note (Signed)
Much better s/p nissan fundoplication

## 2022-12-29 ENCOUNTER — Ambulatory Visit (INDEPENDENT_AMBULATORY_CARE_PROVIDER_SITE_OTHER): Payer: MEDICAID | Admitting: OBSTETRICS/GYNECOLOGY

## 2022-12-29 ENCOUNTER — Other Ambulatory Visit: Payer: MEDICAID | Attending: Family Medicine

## 2022-12-29 ENCOUNTER — Encounter (INDEPENDENT_AMBULATORY_CARE_PROVIDER_SITE_OTHER): Payer: Self-pay | Admitting: OBSTETRICS/GYNECOLOGY

## 2022-12-29 ENCOUNTER — Other Ambulatory Visit: Payer: Self-pay

## 2022-12-29 VITALS — BP 128/74 | HR 72 | Resp 18 | Wt 239.0 lb

## 2022-12-29 DIAGNOSIS — E559 Vitamin D deficiency, unspecified: Secondary | ICD-10-CM | POA: Insufficient documentation

## 2022-12-29 DIAGNOSIS — R7303 Prediabetes: Secondary | ICD-10-CM | POA: Insufficient documentation

## 2022-12-29 DIAGNOSIS — N951 Menopausal and female climacteric states: Secondary | ICD-10-CM | POA: Insufficient documentation

## 2022-12-29 DIAGNOSIS — F5101 Primary insomnia: Secondary | ICD-10-CM | POA: Insufficient documentation

## 2022-12-29 DIAGNOSIS — E785 Hyperlipidemia, unspecified: Secondary | ICD-10-CM | POA: Insufficient documentation

## 2022-12-29 DIAGNOSIS — L659 Nonscarring hair loss, unspecified: Secondary | ICD-10-CM | POA: Insufficient documentation

## 2022-12-29 LAB — CBC WITH DIFF
BASOPHIL #: 0.1 10*3/uL (ref 0.00–0.10)
BASOPHIL %: 2 % — ABNORMAL HIGH (ref 0–1)
EOSINOPHIL #: 0.2 10*3/uL (ref 0.00–0.50)
EOSINOPHIL %: 3 %
HCT: 42 % — ABNORMAL HIGH (ref 31.2–41.9)
HGB: 14.3 g/dL (ref 10.9–14.3)
LYMPHOCYTE #: 1.8 10*3/uL (ref 1.00–3.00)
LYMPHOCYTE %: 23 % (ref 16–44)
MCH: 29.1 pg (ref 24.7–32.8)
MCHC: 33.9 g/dL (ref 32.3–35.6)
MCV: 85.8 fL (ref 75.5–95.3)
MONOCYTE #: 0.4 10*3/uL (ref 0.30–1.00)
MONOCYTE %: 5 % (ref 5–13)
MPV: 8 fL (ref 7.9–10.8)
NEUTROPHIL #: 5.4 10*3/uL (ref 1.85–7.80)
NEUTROPHIL %: 67 % (ref 43–77)
PLATELETS: 254 10*3/uL (ref 140–440)
RBC: 4.9 10*6/uL (ref 3.63–4.92)
RDW: 12.8 % (ref 12.3–17.7)
WBC: 7.9 10*3/uL (ref 3.8–11.8)

## 2022-12-29 LAB — COMPREHENSIVE METABOLIC PANEL, NON-FASTING
ALBUMIN/GLOBULIN RATIO: 1.4 (ref 0.8–1.4)
ALBUMIN: 4.6 g/dL (ref 3.5–5.7)
ALKALINE PHOSPHATASE: 82 U/L (ref 34–104)
ALT (SGPT): 18 U/L (ref 7–52)
ANION GAP: 7 mmol/L (ref 4–13)
AST (SGOT): 15 U/L (ref 13–39)
BILIRUBIN TOTAL: 0.4 mg/dL (ref 0.3–1.2)
BUN/CREA RATIO: 13 (ref 6–22)
BUN: 13 mg/dL (ref 7–25)
CALCIUM, CORRECTED: 9.1 mg/dL (ref 8.9–10.8)
CALCIUM: 9.6 mg/dL (ref 8.6–10.3)
CHLORIDE: 110 mmol/L — ABNORMAL HIGH (ref 98–107)
CO2 TOTAL: 23 mmol/L (ref 21–31)
CREATININE: 1.02 mg/dL (ref 0.60–1.30)
ESTIMATED GFR: 69 mL/min/{1.73_m2} (ref >59)
GLOBULIN: 3.2 (ref 2.9–5.4)
GLUCOSE: 125 mg/dL — ABNORMAL HIGH (ref 74–109)
OSMOLALITY, CALCULATED: 281 mOsm/kg (ref 270–290)
POTASSIUM: 4.1 mmol/L (ref 3.5–5.1)
PROTEIN TOTAL: 7.8 g/dL (ref 6.4–8.9)
SODIUM: 140 mmol/L (ref 136–145)

## 2022-12-29 LAB — LIPID PANEL
CHOL/HDL RATIO: 3.4
CHOLESTEROL: 174 mg/dL (ref <200)
HDL CHOL: 51 mg/dL (ref 23–92)
LDL CALC: 96 mg/dL (ref 0–100)
TRIGLYCERIDES: 137 mg/dL (ref <=150)
VLDL CALC: 27 mg/dL (ref 0–50)

## 2022-12-29 LAB — MAGNESIUM: MAGNESIUM: 2 mg/dL (ref 1.9–2.7)

## 2022-12-29 LAB — HGA1C (HEMOGLOBIN A1C WITH EST AVG GLUCOSE): HEMOGLOBIN A1C: 5.5 % (ref 4.0–6.0)

## 2022-12-29 LAB — FSH: FSH: 7.8 m[IU]/mL (ref 1.4–18.1)

## 2022-12-29 LAB — THYROID STIMULATING HORMONE (SENSITIVE TSH): TSH: 1.068 u[IU]/mL (ref 0.450–5.330)

## 2022-12-29 LAB — VITAMIN D 25 TOTAL: VITAMIN D: 66 ng/mL (ref 30–100)

## 2022-12-29 NOTE — Nursing Note (Signed)
Post op  Surgery was 12/17/21, had hysteroscopy, D&C with novasure ablation. Had some light spotting up until today, pain has been minimal since the procedure.  C/o- hair loss, hot flashes, vaginal dryness, mood swings  Cyndel Griffey, LPN

## 2022-12-29 NOTE — Progress Notes (Signed)
[facility]  [address]  [place] [address]       Name: Lindsay Crawford MRN:  [id number]   Date: 12/29/2022 Age: 47 y.o.       HPI  Lindsay Crawford presents today with   Chief Complaint   Patient presents with    Post Op      Nursing Notes:   [name], LPN  15/40/08 6761  Signed  Post op  Surgery was 12/17/21, had hysteroscopy, D&C with novasure ablation. Had some light spotting up until today, pain has been minimal since the procedure.  C/o- hair loss, hot flashes, vaginal dryness, mood swings  [name], LPN     Follow up    REVIEW OF SYSTEMS :  Pertinent ROS as per HPI    Patient's PMH/PSH/FH/SH/Meds/Allergies were all reviewed and noted in the EPIC chart on today's visit.    Objective:  BP 128/74   Pulse 72   Resp 18   Wt 108 kg (239 lb)   LMP 11/29/2022 (Exact Date)   BMI 36.34 kg/m         Physical Exam:  HEENT  Her appears normally healthy thin  Constitutional: She is oriented. She appears well-developed and well-nourished.     Abdomen: She exhibits no distension. Soft. No tenderness. She has no rebound and no guarding.     Psychiatric: She has a normal mood and affect. Her behavior is normal.     Skin:  Normal, no rashes or skin lesions.    G  Assessment     ICD-10-CM    1. Menopausal symptoms  N95.1 FSH      2. Hair loss  L65.9            Plan:   Discussed hair loss and menopausal symptoms recommended she would her labs drawn per Dr. Barnet Pall I have added [facility] she will follow up with her family physician regarding hair loss she will follow up with me in 3 months Remifemin OTC BID    Orders Placed This Encounter    [place]       Return in 3 months (on 03/29/2023) for GYN folow up on periods after ablation.    Conley Rolls, DO  12/29/2022 09:48      This note was partially generated using MModal Fluency Direct system, and there may be some incorrect words, spellings, and punctuation that were not noted in checking the note before saving.

## 2023-01-03 ENCOUNTER — Encounter (RURAL_HEALTH_CENTER): Payer: Self-pay | Admitting: Family Medicine

## 2023-01-24 ENCOUNTER — Other Ambulatory Visit (INDEPENDENT_AMBULATORY_CARE_PROVIDER_SITE_OTHER): Payer: MEDICAID

## 2023-01-24 ENCOUNTER — Other Ambulatory Visit: Payer: MEDICAID | Attending: OBSTETRICS/GYNECOLOGY | Admitting: OBSTETRICS/GYNECOLOGY

## 2023-01-24 ENCOUNTER — Encounter (INDEPENDENT_AMBULATORY_CARE_PROVIDER_SITE_OTHER): Payer: Self-pay | Admitting: OBSTETRICS/GYNECOLOGY

## 2023-01-24 ENCOUNTER — Other Ambulatory Visit: Payer: Self-pay

## 2023-01-24 ENCOUNTER — Ambulatory Visit (INDEPENDENT_AMBULATORY_CARE_PROVIDER_SITE_OTHER): Payer: MEDICAID | Admitting: OBSTETRICS/GYNECOLOGY

## 2023-01-24 VITALS — BP 137/87 | HR 94 | Wt 238.0 lb

## 2023-01-24 DIAGNOSIS — R102 Pelvic and perineal pain: Secondary | ICD-10-CM | POA: Insufficient documentation

## 2023-01-24 DIAGNOSIS — N926 Irregular menstruation, unspecified: Secondary | ICD-10-CM

## 2023-01-24 DIAGNOSIS — N898 Other specified noninflammatory disorders of vagina: Secondary | ICD-10-CM | POA: Insufficient documentation

## 2023-01-24 DIAGNOSIS — D251 Intramural leiomyoma of uterus: Secondary | ICD-10-CM

## 2023-01-24 LAB — CBC WITH DIFF
BASOPHIL #: 0.1 10*3/uL (ref 0.00–0.10)
BASOPHIL %: 1 % (ref 0–1)
EOSINOPHIL #: 0.2 10*3/uL (ref 0.00–0.50)
EOSINOPHIL %: 2 %
HCT: 40.4 % (ref 31.2–41.9)
HGB: 13.9 g/dL (ref 10.9–14.3)
LYMPHOCYTE #: 1.9 10*3/uL (ref 1.00–3.00)
LYMPHOCYTE %: 18 % (ref 16–44)
MCH: 29.1 pg (ref 24.7–32.8)
MCHC: 34.4 g/dL (ref 32.3–35.6)
MCV: 84.4 fL (ref 75.5–95.3)
MONOCYTE #: 0.6 10*3/uL (ref 0.30–1.00)
MONOCYTE %: 6 % (ref 5–13)
MPV: 8.1 fL (ref 7.9–10.8)
NEUTROPHIL #: 7.6 10*3/uL (ref 1.85–7.80)
NEUTROPHIL %: 73 % (ref 43–77)
PLATELETS: 253 10*3/uL (ref 140–440)
RBC: 4.78 10*6/uL (ref 3.63–4.92)
RDW: 12.5 % (ref 12.3–17.7)
WBC: 10.5 10*3/uL (ref 3.8–11.8)

## 2023-01-24 LAB — WETMOUNT
TRICHOMONAS: ABSENT
YEAST: ABSENT

## 2023-01-24 LAB — C-REACTIVE PROTEIN(CRP),INFLAMMATION: C-REACTIVE PROTEIN (CRP): 1 mg/dL — ABNORMAL HIGH (ref 0.1–0.5)

## 2023-01-24 MED ORDER — TRANEXAMIC ACID 650 MG TABLET
1300.0000 mg | ORAL_TABLET | Freq: Three times a day (TID) | ORAL | 5 refills | Status: AC
Start: 2023-01-24 — End: 2023-01-31

## 2023-01-24 NOTE — Nursing Note (Signed)
C/o periods still lasting 10-12 days. She had an ablation on 12/17/22. States she is passing clots, bleeding normal, cramping pain scale 8/10. States no pain with intercourse.  [name], LPN

## 2023-02-07 ENCOUNTER — Encounter (INDEPENDENT_AMBULATORY_CARE_PROVIDER_SITE_OTHER): Payer: Self-pay | Admitting: OBSTETRICS/GYNECOLOGY

## 2023-02-07 DIAGNOSIS — N926 Irregular menstruation, unspecified: Secondary | ICD-10-CM | POA: Insufficient documentation

## 2023-02-07 NOTE — Progress Notes (Signed)
[facility]  [address]  [place] [address]       Name: Lindsay Crawford MRN:  [id number]   Date: 01/24/2023 Age: 47 y.o.       HPI  Lindsay Crawford presents today with   Chief Complaint   Patient presents with    Ultrasound    Irregular Bleeding      Nursing Notes:   [name], LPN  [id number]  Signed  C/o periods still lasting 10-12 days. She had an ablation on 12/17/22. States she is passing clots, bleeding normal, cramping pain scale 8/10. States no pain with intercourse.  Summer Price, LPN     Follow up    REVIEW OF SYSTEMS :  Pertinent ROS as per HPI    Patient's PMH/PSH/FH/SH/Meds/Allergies were all reviewed and noted in the EPIC chart on today's visit.    Objective:  BP 137/87   Pulse 94   Wt 108 kg (238 lb)   BMI 36.19 kg/m         Physical Exam:    Constitutional: She is oriented. She appears well-developed and well-nourished.     Abdomen: She exhibits no distension. Soft. No tenderness. She has no rebound and no guarding.     Psychiatric: She has a normal mood and affect. Her behavior is normal.     Skin:  Normal, no rashes or skin lesions.    GU:  External Exam: External exam normal.    No urethral tenderness.  No genital lesions present.    Vaginal Exam:  Clear homogeneous discharge   Bladder:  Bladder is normal to exam. Suprapubic tenderness not present.    Uterus: Uterus is normal to exam.  Cervix is normal to exam.  Contour:  Regular.      Adnexa:  No palpable masses and no tenderness.        Assessment     ICD-10-CM    1. Irregular periods/menstrual cycles  N92.6 US PELVIS NON OB TRANSVAGINAL     76830 - ULTRASOUND, TRANSVAGINAL (AMB ONLY)     [place]     CBC/DIFF     C-REACTIVE PROTEIN(CRP),INFLAMMATION      2. Pelvic pain  R10.2 US PELVIS NON OB TRANSVAGINAL     76830 - ULTRASOUND, TRANSVAGINAL (AMB ONLY)     [place]     CBC/DIFF     C-REACTIVE PROTEIN(CRP),INFLAMMATION      3. Vaginal discharge  N89.8 [place]           Plan:   Obtain labs including CBC CRP wet  prep patient has had a previous endometrial ablation 19th January 2024 follow up 2 months    Orders Placed This Encounter    76830 - ULTRASOUND, TRANSVAGINAL (AMB ONLY)    [place]    US PELVIS NON OB TRANSVAGINAL    CBC/DIFF    C-REACTIVE PROTEIN(CRP),INFLAMMATION    CBC WITH DIFF    tranexamic acid (LYSTEDA) 650 mg Oral Tablet       No follow-ups on file.    Conley Rolls, DO  02/07/2023 08:14      This note was partially generated using MModal Fluency Direct system, and there may be some incorrect words, spellings, and punctuation that were not noted in checking the note before saving.

## 2023-02-07 NOTE — Procedures (Signed)
[facility]  [address]  [place] [address]    Procedure Note    Name: Lindsay Crawford MRN:  [id number]   Date: 01/24/2023 DOB:  [birth year] (47 y.o.)         76830 - ULTRASOUND, TRANSVAGINAL (AMB ONLY)    Performed by: Conley Rolls, DO  Authorized by: Conley Rolls, DO    Time Out:     Immediately before the procedure, a time out was called:  Yes    Patient verified:  Yes    Procedure Verified:  Yes    Site Verified:  Yes  Documentation:      Ultrasound  report with measurements reviewed. & signed. Results scanned to imaging order.   Interpretation:  Reviewed and discussed with the patient          Conley Rolls, DO

## 2023-02-08 ENCOUNTER — Other Ambulatory Visit: Payer: Self-pay

## 2023-02-08 ENCOUNTER — Emergency Department
Admission: EM | Admit: 2023-02-08 | Discharge: 2023-02-08 | Disposition: A | Discharge status: Home / Self Care | Payer: MEDICAID | Attending: Emergency Medicine | Admitting: Emergency Medicine

## 2023-02-08 ENCOUNTER — Encounter (HOSPITAL_BASED_OUTPATIENT_CLINIC_OR_DEPARTMENT_OTHER): Payer: Self-pay

## 2023-02-08 DIAGNOSIS — Z1152 Encounter for screening for COVID-19: Secondary | ICD-10-CM | POA: Insufficient documentation

## 2023-02-08 DIAGNOSIS — N390 Urinary tract infection, site not specified: Secondary | ICD-10-CM | POA: Insufficient documentation

## 2023-02-08 DIAGNOSIS — K529 Noninfective gastroenteritis and colitis, unspecified: Secondary | ICD-10-CM

## 2023-02-08 DIAGNOSIS — E860 Dehydration: Secondary | ICD-10-CM

## 2023-02-08 LAB — URINALYSIS, MACRO/MICRO
BILIRUBIN: NEGATIVE mg/dL
BLOOD: NEGATIVE mg/dL
GLUCOSE: NEGATIVE mg/dL
KETONES: NEGATIVE mg/dL
NITRITE: NEGATIVE
PH: 6 (ref 4.6–8.0)
PROTEIN: 30 mg/dL — AB
SPECIFIC GRAVITY: 1.015 (ref 1.003–1.035)
UROBILINOGEN: 0.2 mg/dL (ref 0.2–1.0)

## 2023-02-08 LAB — CBC WITH DIFF
BASOPHIL #: 0.01 10*3/uL (ref 0.00–0.30)
BASOPHIL %: 0 % (ref 0–3)
EOSINOPHIL #: 0.05 10*3/uL (ref 0.00–0.80)
EOSINOPHIL %: 0 % (ref 0–7)
HCT: 48.8 % — ABNORMAL HIGH (ref 37.0–47.0)
HGB: 16.4 g/dL — ABNORMAL HIGH (ref 12.5–16.0)
LYMPHOCYTE #: 0.5 10*3/uL — ABNORMAL LOW (ref 1.10–5.00)
LYMPHOCYTE %: 3 % — ABNORMAL LOW (ref 25–45)
MCH: 29.5 pg (ref 27.0–32.0)
MCHC: 33.6 g/dL (ref 32.0–36.0)
MCV: 87.7 fL (ref 78.0–99.0)
MONOCYTE #: 0.52 10*3/uL (ref 0.00–1.30)
MONOCYTE %: 3 % (ref 0–12)
MPV: 7.8 fL (ref 7.4–10.4)
NEUTROPHIL #: 14.39 10*3/uL — ABNORMAL HIGH (ref 1.80–8.40)
NEUTROPHIL %: 93 % — ABNORMAL HIGH (ref 40–76)
PLATELETS: 290 10*3/uL (ref 140–440)
RBC: 5.56 10*6/uL — ABNORMAL HIGH (ref 4.20–5.40)
RDW: 14.9 % — ABNORMAL HIGH (ref 11.6–14.8)
WBC: 15.5 10*3/uL — ABNORMAL HIGH (ref 4.0–10.5)

## 2023-02-08 LAB — LIPASE: LIPASE: 15 U/L — ABNORMAL LOW (ref 16–77)

## 2023-02-08 LAB — COMPREHENSIVE METABOLIC PANEL, NON-FASTING
ALBUMIN/GLOBULIN RATIO: 1 (ref 0.8–1.4)
ALBUMIN: 4.3 g/dL (ref 3.4–5.0)
ALKALINE PHOSPHATASE: 122 U/L — ABNORMAL HIGH (ref 46–116)
ALT (SGPT): 41 U/L (ref <=78)
ANION GAP: 16 mmol/L — ABNORMAL HIGH (ref 4–13)
AST (SGOT): 26 U/L (ref 15–37)
BILIRUBIN TOTAL: 0.7 mg/dL (ref 0.2–1.0)
BUN/CREA RATIO: 18
BUN: 18 mg/dL (ref 7–18)
CALCIUM, CORRECTED: 9 mg/dL
CALCIUM: 9.2 mg/dL (ref 8.5–10.1)
CHLORIDE: 105 mmol/L (ref 98–107)
CO2 TOTAL: 19 mmol/L — ABNORMAL LOW (ref 21–32)
CREATININE: 1.02 mg/dL (ref 0.55–1.02)
ESTIMATED GFR: 69 mL/min/{1.73_m2} (ref >59)
GLOBULIN: 4.2
GLUCOSE: 176 mg/dL — ABNORMAL HIGH (ref 74–106)
OSMOLALITY, CALCULATED: 286 mOsm/kg (ref 270–290)
POTASSIUM: 3.6 mmol/L (ref 3.5–5.1)
PROTEIN TOTAL: 8.5 g/dL — ABNORMAL HIGH (ref 6.4–8.2)
SODIUM: 140 mmol/L (ref 136–145)

## 2023-02-08 LAB — COVID-19, FLU A/B, RSV RAPID BY PCR
INFLUENZA VIRUS TYPE A: NOT DETECTED
INFLUENZA VIRUS TYPE B: NOT DETECTED
RESPIRATORY SYNCTIAL VIRUS (RSV): NOT DETECTED
SARS-CoV-2: NOT DETECTED

## 2023-02-08 LAB — URINALYSIS, MICROSCOPIC

## 2023-02-08 LAB — PTT (PARTIAL THROMBOPLASTIN TIME): APTT: 28.7 seconds (ref 22.0–31.7)

## 2023-02-08 LAB — MAGNESIUM: MAGNESIUM: 1.6 mg/dL — ABNORMAL LOW (ref 1.8–2.4)

## 2023-02-08 LAB — PT/INR
INR: 1.05 (ref 0.88–1.10)
PROTHROMBIN TIME: 12.2 seconds (ref 9.8–12.7)

## 2023-02-08 MED ORDER — MAGNESIUM SULFATE 1 GRAM/100 ML IN DEXTROSE 5 % INTRAVENOUS PIGGYBACK
INJECTION | INTRAVENOUS | Status: AC
Start: 2023-02-08 — End: 2023-02-08
  Filled 2023-02-08: qty 100

## 2023-02-08 MED ORDER — ONDANSETRON 4 MG DISINTEGRATING TABLET
4.0000 mg | ORAL_TABLET | Freq: Three times a day (TID) | ORAL | 0 refills | Status: DC | PRN
Start: 2023-02-08 — End: 2023-04-25

## 2023-02-08 MED ORDER — CEFTRIAXONE 2 GRAM SOLUTION FOR INJECTION
INTRAMUSCULAR | Status: AC
Start: 2023-02-08 — End: 2023-02-08
  Filled 2023-02-08: qty 20

## 2023-02-08 MED ORDER — IBUPROFEN 800 MG TABLET
800.0000 mg | ORAL_TABLET | ORAL | Status: AC
Start: 2023-02-08 — End: 2023-02-08
  Administered 2023-02-08: 800 mg via ORAL

## 2023-02-08 MED ORDER — SULFAMETHOXAZOLE 800 MG-TRIMETHOPRIM 160 MG TABLET
1.0000 | ORAL_TABLET | Freq: Two times a day (BID) | ORAL | 0 refills | Status: AC
Start: 2023-02-08 — End: 2023-02-18

## 2023-02-08 MED ORDER — DICYCLOMINE 10 MG CAPSULE
20.0000 mg | ORAL_CAPSULE | ORAL | Status: AC
Start: 2023-02-08 — End: 2023-02-08
  Administered 2023-02-08: 20 mg via ORAL

## 2023-02-08 MED ORDER — ACETAMINOPHEN 325 MG TABLET
650.0000 mg | ORAL_TABLET | ORAL | Status: AC
Start: 2023-02-08 — End: 2023-02-08
  Administered 2023-02-08: 650 mg via ORAL

## 2023-02-08 MED ORDER — METOCLOPRAMIDE 5 MG/ML INJECTION SOLUTION
INTRAMUSCULAR | Status: AC
Start: 2023-02-08 — End: 2023-02-08
  Filled 2023-02-08: qty 2

## 2023-02-08 MED ORDER — SODIUM CHLORIDE 0.9 % INTRAVENOUS PIGGYBACK
INJECTION | INTRAVENOUS | Status: AC
Start: 2023-02-08 — End: 2023-02-08
  Filled 2023-02-08: qty 50

## 2023-02-08 MED ORDER — METOCLOPRAMIDE 5 MG/ML INJECTION SOLUTION
10.0000 mg | INTRAMUSCULAR | Status: AC
Start: 2023-02-08 — End: 2023-02-08
  Administered 2023-02-08: 10 mg via INTRAVENOUS

## 2023-02-08 MED ORDER — IBUPROFEN 800 MG TABLET
ORAL_TABLET | ORAL | Status: AC
Start: 2023-02-08 — End: 2023-02-08
  Filled 2023-02-08: qty 1

## 2023-02-08 MED ORDER — DIPHENOXYLATE-ATROPINE 2.5 MG-0.025 MG TABLET
2.0000 | ORAL_TABLET | ORAL | Status: AC
Start: 2023-02-08 — End: 2023-02-08
  Administered 2023-02-08: 2 via ORAL

## 2023-02-08 MED ORDER — SODIUM CHLORIDE 0.9 % INTRAVENOUS PIGGYBACK
2.0000 g | INTRAVENOUS | Status: AC
Start: 2023-02-08 — End: 2023-02-08
  Administered 2023-02-08: 0 g via INTRAVENOUS
  Administered 2023-02-08: 2 g via INTRAVENOUS

## 2023-02-08 MED ORDER — ACETAMINOPHEN 325 MG TABLET
ORAL_TABLET | ORAL | Status: AC
Start: 2023-02-08 — End: 2023-02-08
  Filled 2023-02-08: qty 2

## 2023-02-08 MED ORDER — DIPHENOXYLATE-ATROPINE 2.5 MG-0.025 MG TABLET
ORAL_TABLET | ORAL | Status: AC
Start: 2023-02-08 — End: 2023-02-08
  Filled 2023-02-08: qty 2

## 2023-02-08 MED ORDER — DIPHENOXYLATE-ATROPINE 2.5 MG-0.025 MG TABLET
1.0000 | ORAL_TABLET | Freq: Four times a day (QID) | ORAL | 0 refills | Status: AC | PRN
Start: 2023-02-08 — End: 2023-02-11

## 2023-02-08 MED ORDER — DICYCLOMINE 20 MG TABLET
ORAL_TABLET | ORAL | Status: AC
Start: 2023-02-08 — End: 2023-02-08
  Filled 2023-02-08: qty 1

## 2023-02-08 MED ORDER — LACTATED RINGERS IV BOLUS
1000.0000 mL | INJECTION | Status: AC
Start: 2023-02-08 — End: 2023-02-08
  Administered 2023-02-08: 1000 mL via INTRAVENOUS
  Administered 2023-02-08: 0 mL via INTRAVENOUS

## 2023-02-08 MED ORDER — MAGNESIUM SULFATE 1 GRAM/100 ML IN DEXTROSE 5 % INTRAVENOUS PIGGYBACK
1.0000 g | INJECTION | Freq: Once | INTRAVENOUS | Status: AC
Start: 2023-02-08 — End: 2023-02-08
  Administered 2023-02-08: 1 g via INTRAVENOUS
  Administered 2023-02-08: 0 g via INTRAVENOUS

## 2023-02-08 NOTE — ED Nurses Note (Signed)
Patient discharged home with family/self with treatment of initial complaint upon arrival to ER. AVS reviewed by physician with patient/care giver. A written copy of the AVS and discharge instructions was given to the patient/care giver. Scripts handed to patient/care giver or sent to pharmacy indicated on AVS print out. Pt verbalized understanding of taking any medications as prescribed. Questions and concerns sufficiently answered as needed. Patient/care giver encouraged to follow up with PCP as indicated. In the event of an emergency, patient/care giver instructed to call 911 or go to the nearest emergency room. No other concerns voiced at time of discharge to physician or nurse. Respiration even and unlabored. Pt ambulatory out of ED.

## 2023-02-08 NOTE — ED Nurses Note (Signed)
Pt resting on stretcher, respirations even and unlabored. No s/s of distress at this time. Plan of care ongoing. Call light in reach.

## 2023-02-08 NOTE — ED Nurses Note (Signed)
Pt attempting to drink some sprite at this time.

## 2023-02-08 NOTE — ED Triage Notes (Signed)
Pt states that she started dry heaving and having diarrhea today. States that she last took a Zofran at approx 1700. States that she has had no relief.

## 2023-02-08 NOTE — ED Nurses Note (Signed)
Pt made aware that her Rx (LOMOTIL) was dropped and that she will need to come pick it up in the morning at the charge nurses station and to bring her ID to confirm RX.

## 2023-02-08 NOTE — ED Attending Note (Signed)
[facility] emergency department         HISTORY OF PRESENT ILLNESS     Date:  02/08/2023  Patient's Name:  Lindsay Crawford  Date of Birth:  Sep 29, 1976    PATIENT IS A 46 YEAR PRESENTING WITH NAUSEA AND VOMITING BEGINNING YESTERDAY WORSE THROUGHOUT THE DAY TODAY.  NO ONE ELSE IN THE HOME HAS BEEN ILL .  PATIENT STATES SHE HAS BEEN DRY HEAVING BUT HAS HAD MULTIPLE DIARRHEA STOOLS.  SHE ALSO HAS HAD CHILLS AND FEVER.  SHE DENIES ANY BACK PAIN BUT DOES HAVE SOME FLANK PAIN SHE DENIES ANY DYSURIA        Review of Systems     Review of Systems   HENT: Negative.     Eyes: Negative.    Respiratory: Negative.     Cardiovascular: Negative.    Gastrointestinal: Negative.    Musculoskeletal: Negative.    Hematological: Negative.    Psychiatric/Behavioral: Negative.     All other systems reviewed and are negative.      Previous History     Past Medical History:  Past Medical History:   Diagnosis Date   . Body mass index 35.0-35.9, adult    . Borderline diabetes    . Chronic low back pain    . Dizziness    . DUB (dysfunctional uterine bleeding)    . Gastroparesis    . GERD (gastroesophageal reflux disease)    . Hyperglycemia    . Hyperlipidemia LDL goal <70    . Insomnia    . Knee pain, bilateral    . Migraine    . Paraspinal muscle spasm    . Vitamin D deficiency        Past Surgical History:  Past Surgical History:   Procedure Laterality Date   . Cesarean section  2004   . Colonoscopy     . Endometrial ablation w/ novasure     . Esophagoscopy / egd     . Hx appendectomy     . Hx back surgery     . Hx lap cholecystectomy     . Laparoscopic tubal ligation  07/30/2022   . Nissen fundoplication  [id number]       Social History:  Social History     Tobacco Use   . Smoking status: Never   . Smokeless tobacco: Never   Vaping Use   . Vaping status: Never Used   Substance Use Topics   . Alcohol use: Never   . Drug use: Never     Social History     Substance and Sexual Activity   Drug Use Never        Family History:  Family History   Problem Relation Age of Onset   . Hypertension (High Blood Pressure) Mother    . COPD Father    . Diabetes type II Father    . Hypertension (High Blood Pressure) Father    . Kidney Disease Father    . Throat cancer Sister    . Coronary Artery Disease Brother    . No Known Problems Half-Sister    . No Known Problems Half-Brother    . No Known Problems Maternal Aunt    . No Known Problems Maternal Uncle    . No Known Problems Paternal Aunt    . No Known Problems Paternal Uncle    . No Known Problems Maternal Grandmother    . No Known Problems Maternal Grandfather    .  No Known Problems Paternal Grandmother    . No Known Problems Paternal Grandfather    . No Known Problems Daughter    . No Known Problems Son        Medication History:  Current Outpatient Medications   Medication Sig   . albuterol sulfate (PROVENTIL OR VENTOLIN OR PROAIR) 90 mcg/actuation Inhalation oral inhaler Take 2 Puffs by inhalation Every 6 hours as needed   . ascorbic acid, vitamin C, (VITAMIN C) 500 mg Oral Tablet Take 1 Tablet (500 mg total) by mouth Once a day   . bethanechol chloride (URECHOLINE) 5 mg Oral Tablet Take 1 Tablet (5 mg total) by mouth Twice daily   . Biotin 10 mg Oral Tablet Take 1 Tablet (10 mg total) by mouth Once a day   . cetirizine (ZYRTEC) 10 mg Oral Tablet Take 1 Tablet (10 mg total) by mouth Once a day   . cholecalciferol, vitamin D3, 1,250 mcg (50,000 unit) Oral Capsule Take 1 Capsule (50,000 Units total) by mouth Every 7 days   . cyanocobalamin (VITAMIN B 12) 1,000 mcg Oral Tablet Take 1 Tablet (1,000 mcg total) by mouth Once a day   . cyclobenzaprine (FLEXERIL) 10 mg Oral Tablet Take 1 Tablet (10 mg total) by mouth Twice daily   . famotidine (PEPCID) 40 mg Oral Tablet Take 1 Tablet (40 mg total) by mouth Every evening   . ferrous sulfate (FEOSOL) 325 mg (65 mg iron) Oral Tablet Take 1 Tablet (325 mg total) by mouth Once a day   . Ibuprofen (MOTRIN) 800 mg Oral Tablet Take 1  Tablet (800 mg total) by mouth Every 6 hours as needed   . linaCLOtide (LINZESS) 145 mcg Oral Capsule Take 1 Capsule (145 mcg total) by mouth Every morning   . Lysine 500 mg Oral Tablet Take 1 Tablet (500 mg total) by mouth Once per day as needed   . magnesium chloride (SLOW-MAG) 64 mg Oral Tablet, Delayed Release (E.C.) Take 1 Tablet (64 mg total) by mouth Once a day   . metoclopramide HCl (REGLAN) 5 mg Oral Tablet Take 1 Tablet (5 mg total) by mouth Once a day   . naloxone (NARCAN) 4 mg per spray nasal spray ADMINISTER A SINGLE SPRAY IN ONE NOSTRIL UPON SIGNS OF OPIOID OVERDOSE. CALL 911. REPEAT AFTER 3 MINUTES IF NO RESPONSE.   Marland Kitchen omeprazole (PRILOSEC) 20 mg Oral Capsule, Delayed Release(E.C.) TAKE 1 CAPSULE BY MOUTH TWICE DAILY AS DIRECTED   . QUEtiapine (SEROQUEL) 100 mg Oral Tablet Take 1 Tablet (100 mg total) by mouth Twice daily   . rosuvastatin (CRESTOR) 5 mg Oral Tablet Take 1 Tablet (5 mg total) by mouth Once a day   . topiramate (TOPAMAX) 100 mg Oral Tablet Take 1 Tablet (100 mg total) by mouth Twice daily       Allergies:  Allergies   Allergen Reactions   . Other Rash     Allergic to the sun        Physical Exam     Vitals:    BP (!) 138/95   Pulse (!) 116   Temp 38 C (100.4 F)   Resp 20   Ht 1.727 m ('5\' 8"'$ )   Wt 108 kg (237 lb)   SpO2 98%   BMI 36.04 kg/m           Physical Exam  Vitals and nursing note reviewed.   Constitutional:       General: She is not in acute distress.  Appearance: She is well-developed.   HENT:      Head: Normocephalic and atraumatic.      Right Ear: Tympanic membrane normal.      Left Ear: Tympanic membrane normal.      Nose: Nose normal.      Mouth/Throat:      Mouth: Mucous membranes are dry.   Eyes:      Conjunctiva/sclera: Conjunctivae normal.      Pupils: Pupils are equal, round, and reactive to light.   Cardiovascular:      Rate and Rhythm: Normal rate and regular rhythm.      Heart sounds: No murmur heard.  Pulmonary:      Effort: Pulmonary effort is normal.  No respiratory distress.      Breath sounds: Normal breath sounds.   Abdominal:      Palpations: Abdomen is soft.      Tenderness: There is no abdominal tenderness.   Musculoskeletal:         General: No swelling.      Cervical back: Neck supple.   Skin:     General: Skin is warm and dry.      Capillary Refill: Capillary refill takes less than 2 seconds.   Neurological:      Mental Status: She is alert.   Psychiatric:         Mood and Affect: Mood normal.         Diagnostic Studies/Treatment     Medications:  Medications Administered in the ED   cefTRIAXone (ROCEPHIN) 2 g in NS 50 mL IVPB minibag (has no administration in time range)   LR bolus infusion 1,000 mL (1,000 mL Intravenous New Bag/New Syringe 02/08/23 1957)   diphenoxylate-atropine (LOMOTIL) 2.5-0.025 mg per tablet (2 Tablets Oral Given 02/08/23 1958)   dicyclomine (BENTYL) capsule (20 mg Oral Given 02/08/23 2000)   metoclopramide (REGLAN) 5 mg/mL injection (10 mg Intravenous Given 02/08/23 1958)   acetaminophen (TYLENOL) tablet (650 mg Oral Given 02/08/23 1958)       New Prescriptions    No medications on file       Labs:    Results for orders placed or performed during the hospital encounter of 02/08/23 (from the past 12 hour(s))   PT/INR   Result Value Ref Range    PROTHROMBIN TIME 12.2 9.8 - 12.7 seconds    INR 1.05 0.88 - 1.10   PTT (PARTIAL THROMBOPLASTIN TIME)   Result Value Ref Range    APTT 28.7 22.0 - 31.7 seconds   COMPREHENSIVE METABOLIC PANEL, NON-FASTING   Result Value Ref Range    SODIUM 140 136 - 145 mmol/L    POTASSIUM 3.6 3.5 - 5.1 mmol/L    CHLORIDE 105 98 - 107 mmol/L    CO2 TOTAL 19 (L) 21 - 32 mmol/L    ANION GAP 16 (H) 4 - 13 mmol/L    BUN 18 7 - 18 mg/dL    CREATININE 1.02 0.55 - 1.02 mg/dL    BUN/CREA RATIO 18     ESTIMATED GFR 69 >59 mL/min/1.76m2    ALBUMIN 4.3 3.4 - 5.0 g/dL    CALCIUM 9.2 8.5 - 10.1 mg/dL    GLUCOSE 176 (H) 74 - 106 mg/dL    ALKALINE PHOSPHATASE 122 (H) 46 - 116 U/L    ALT (SGPT) 41 <=78 U/L    AST (SGOT) 26 15 -  37 U/L    BILIRUBIN TOTAL 0.7 0.2 - 1.0 mg/dL    PROTEIN TOTAL 8.5 (  H) 6.4 - 8.2 g/dL    ALBUMIN/GLOBULIN RATIO 1.0 0.8 - 1.4    OSMOLALITY, CALCULATED 286 270 - 290 mOsm/kg    CALCIUM, CORRECTED 9.0 mg/dL    GLOBULIN 4.2    MAGNESIUM   Result Value Ref Range    MAGNESIUM 1.6 (L) 1.8 - 2.4 mg/dL   LIPASE   Result Value Ref Range    LIPASE 15 (L) 16 - 77 U/L   CBC WITH DIFF   Result Value Ref Range    WBC 15.5 (H) 4.0 - 10.5 x10^3/uL    RBC 5.56 (H) 4.20 - 5.40 x10^6/uL    HGB 16.4 (H) 12.5 - 16.0 g/dL    HCT 48.8 (H) 37.0 - 47.0 %    MCV 87.7 78.0 - 99.0 fL    MCH 29.5 27.0 - 32.0 pg    MCHC 33.6 32.0 - 36.0 g/dL    RDW 14.9 (H) 11.6 - 14.8 %    PLATELETS 290 140 - 440 x10^3/uL    MPV 7.8 7.4 - 10.4 fL    NEUTROPHIL % 93 (H) 40 - 76 %    LYMPHOCYTE % 3 (L) 25 - 45 %    MONOCYTE % 3 0 - 12 %    EOSINOPHIL % 0 0 - 7 %    BASOPHIL % 0 0 - 3 %    NEUTROPHIL # 14.39 (H) 1.80 - 8.40 x10^3/uL    LYMPHOCYTE # 0.50 (L) 1.10 - 5.00 x10^3/uL    MONOCYTE # 0.52 0.00 - 1.30 x10^3/uL    EOSINOPHIL # 0.05 0.00 - 0.80 x10^3/uL    BASOPHIL # 0.01 0.00 - 0.30 x10^3/uL   URINALYSIS, MACRO/MICRO   Result Value Ref Range    COLOR Yellow Yellow    APPEARANCE Clear Clear    SPECIFIC GRAVITY 1.015 1.003 - 1.035    PH 6.0 4.6 - 8.0    LEUKOCYTES Small (A) Negative WBCs/uL    NITRITE Negative Negative    PROTEIN 30 (A) Negative mg/dL    GLUCOSE Negative Negative mg/dL    KETONES Negative Negative mg/dL    BILIRUBIN Negative Negative mg/dL    BLOOD Negative Negative mg/dL    UROBILINOGEN 0.2 0.2 - 1.0 mg/dL        Radiology:  None    No orders to display       ECG:  NONE            Differential diagnosis  1. GASTROENTERITIS 2. NAUSEA AND VOMIT 3. COVID-19 INFECTION 4.  INFLUENZA DEHYDRATION     Course/Disposition/Plan     Course:     ED Course as of 02/08/23 2146   Tue Feb 08, 2023   2145 PATIENT TOLERATING LIQUIDS BY MOUTH DID RECEIVE ELECTROLYTE REPLACEMENT AND IV ANTIBIOTICS   2146 PATIENT HAS HAD NO FURTHER EPISODES OF DIARRHEA   PATIENT  RECEIVING IV FLUID ANTIEMETICS BASELINE LABS HAVE BEEN DONE COVID-19 AND FLU TESTING OBTAINED    Disposition:    Discharged    Condition at Disposition:     STABLE  Follow up:   No follow-up provider specified.    Clinical Impression:     Clinical Impression   Gastroenteritis (Primary)   Dehydration         Winfred Burn, MD

## 2023-02-08 NOTE — ED Nurses Note (Signed)
Pt offered any comfort measures (food, water, repositioning, restroom, etc.). Pt expresses no other concerns or needs at this time. See physical assessment. Plan of care ongoing. Call light in reach.

## 2023-02-08 NOTE — Discharge Instructions (Signed)
CLEAR LIQUIDS FOR 6-8 HOURS

## 2023-02-10 LAB — URINE CULTURE,ROUTINE: URINE CULTURE: >100000 — AB

## 2023-03-22 ENCOUNTER — Other Ambulatory Visit (RURAL_HEALTH_CENTER): Payer: Self-pay | Admitting: Family Medicine

## 2023-03-22 DIAGNOSIS — Z1231 Encounter for screening mammogram for malignant neoplasm of breast: Secondary | ICD-10-CM

## 2023-03-31 IMAGING — MG 3D SCREENING MAMMO BIL AND TOMO
5 series · 7 of 24 positions shown · non-contrast
Comparison: 08/18/2021

------------- REPORT [id number] -------------
﻿

EXAM:  3D SCREENING MAMMO BIL AND TOMO
INDICATION: Screening mammogram.

[L]
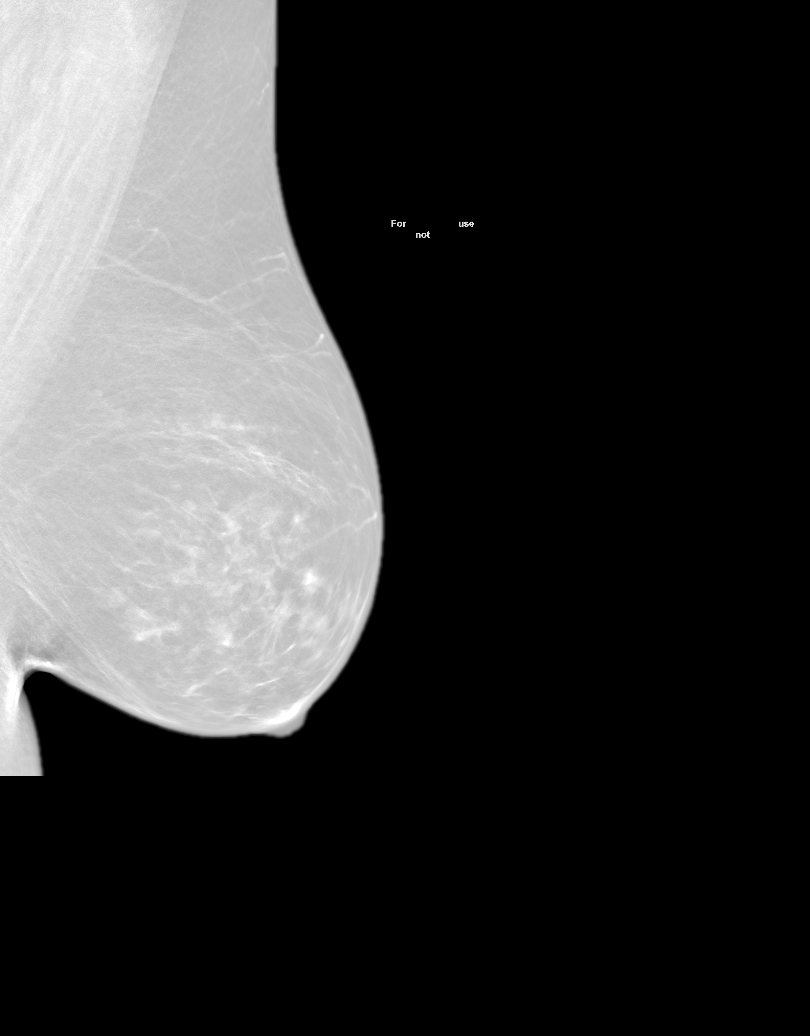

[R]
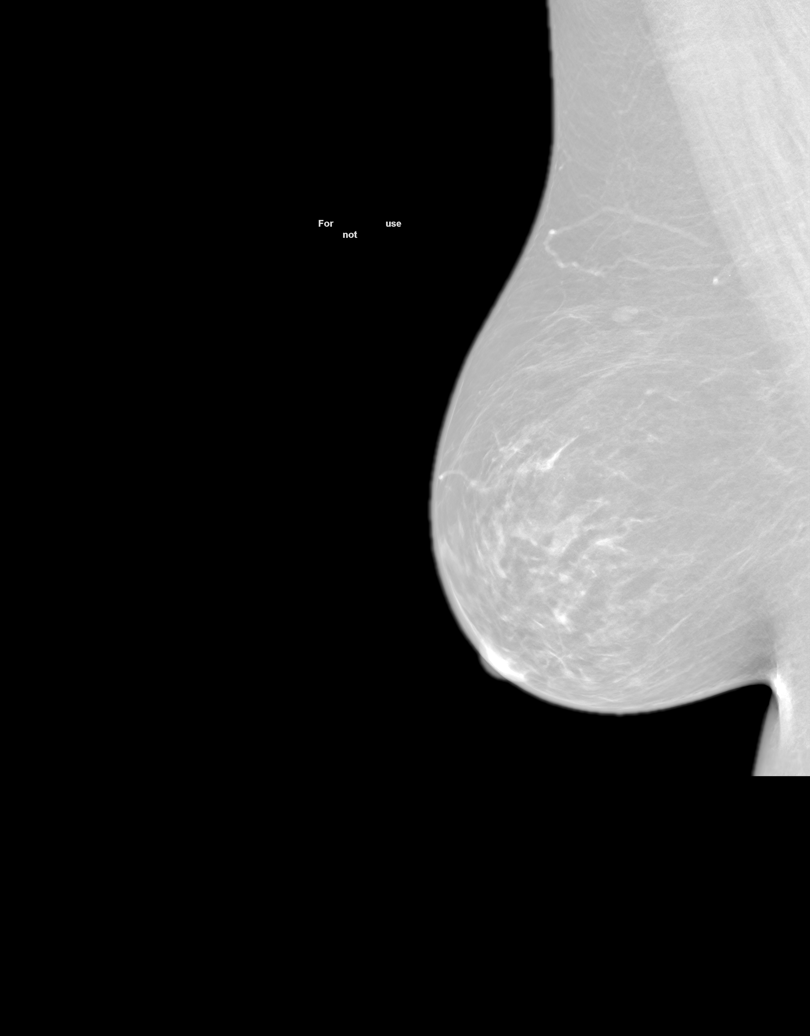

[R CC tomo · right · 0.10mm/px · 2 of 2 slices shown]
[im 1/2]
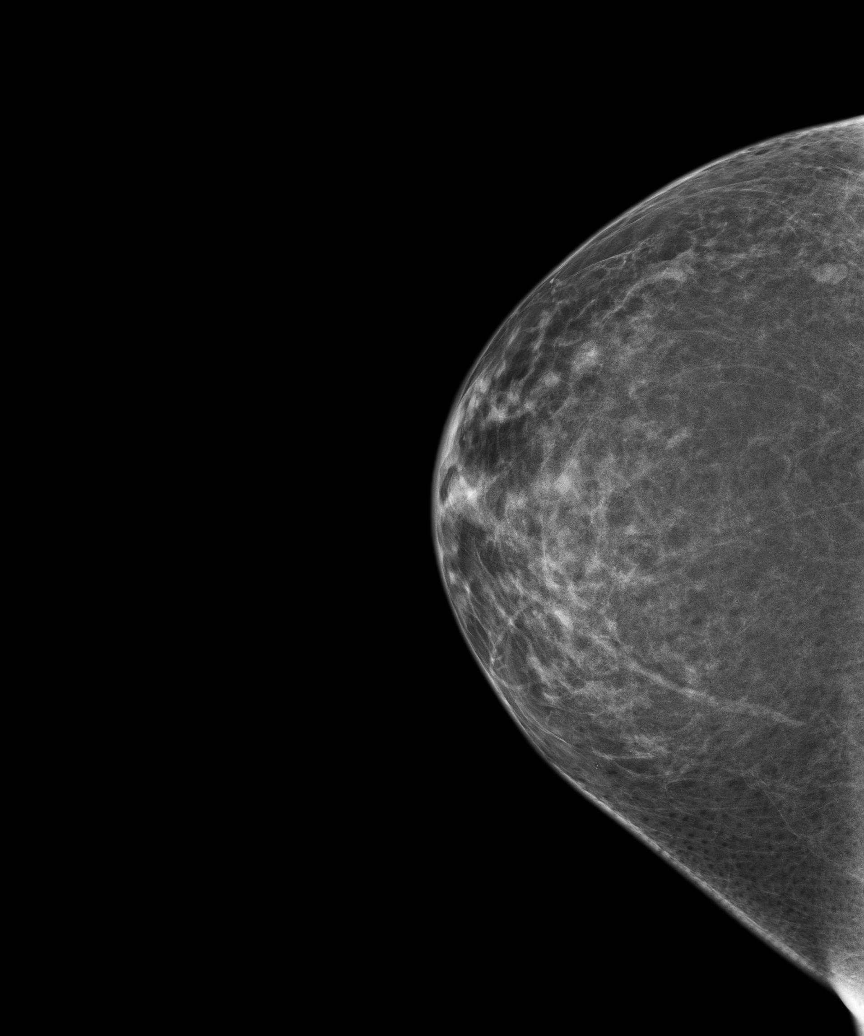
[im 2/2]
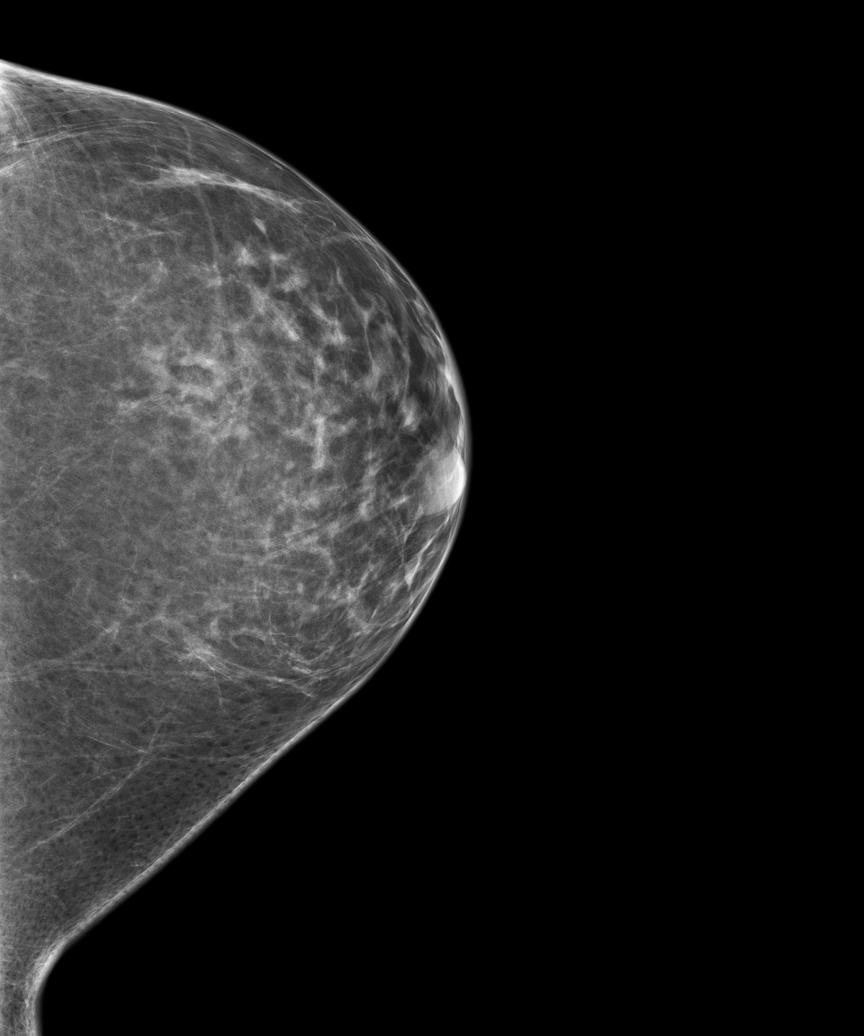

[3D SCREENING MAMMO BIL AND TOMO tomo · 2 acquisitions, 2 frames shown (1 of 2)]
[im 1/2]
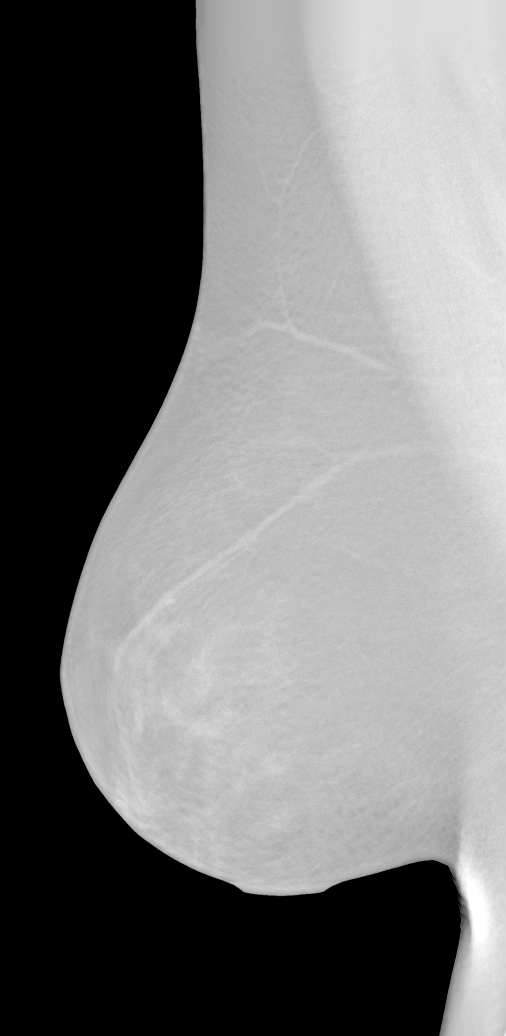
[im 2/2]
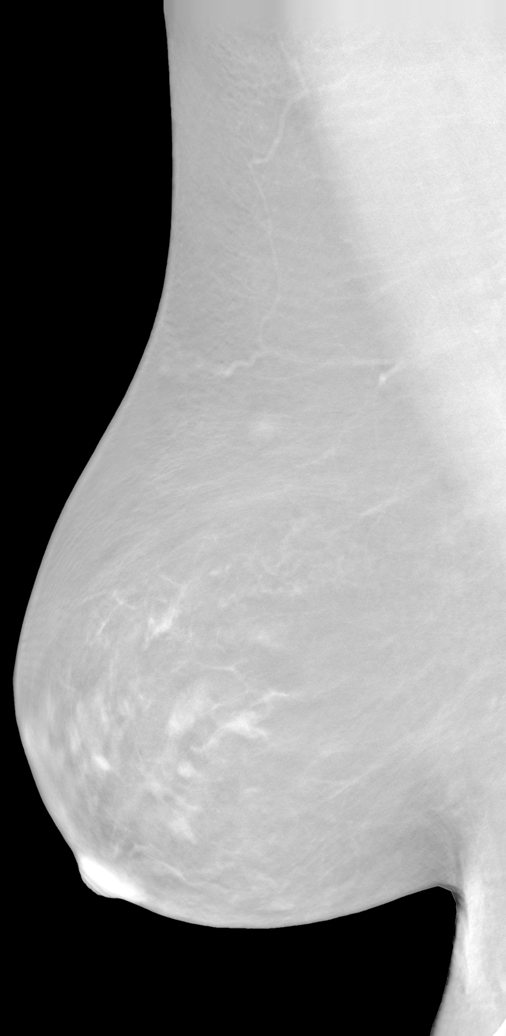

[3D SCREENING MAMMO BIL AND TOMO tomo (2 of 2) · tomo slice 14/88.0]
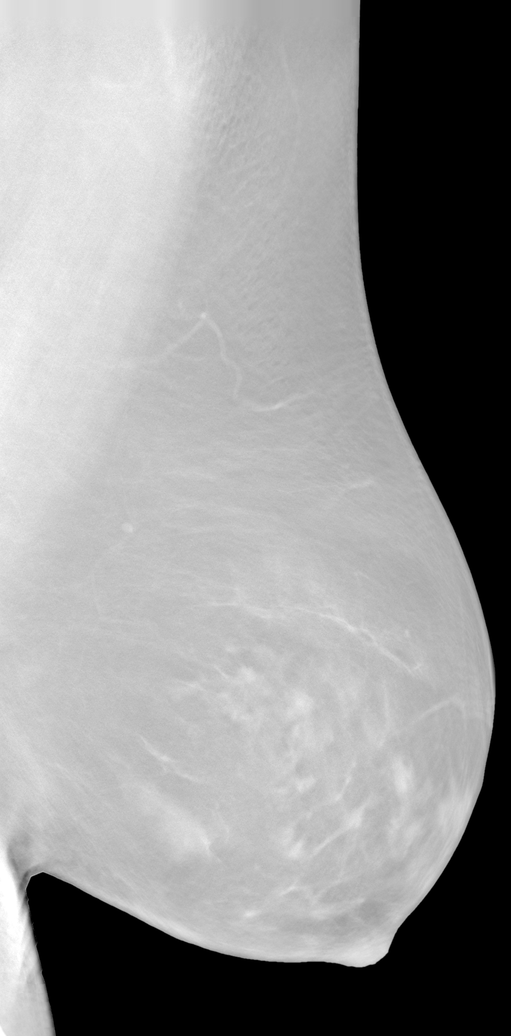

[7 of 24 positions shown; findings below may reference images not displayed]

FINDINGS: There are scattered fibroglandular elements.  There is no mass or suspicious cluster of microcalcifications.   There is no architectural distortion, skin thickening or nipple retraction.
IMPRESSION: 1.  BIRADS 2-Benign findings. Patient has been added in a reminder system with a target date for the next screening mammography.

2.  DENSITY CODE –  B (Scattered areas of fibroglandular density) 

Final Assessment Code:

BI-RADS 0
 Need additional imaging evaluation.

BI-RADS 1
 Negative mammogram.

BI-RADS 2
 Benign finding.

BI-RADS 3
 Probably benign finding; short-interval follow-up suggested.

BI-RADS 4
 Suspicious abnormality; biopsy should be considered.

BI-RADS 5
 Highly suggestive of malignancy; appropriate action should be taken.

BI-RADS 6
 Known biopsy-proven malignancy; appropriate action should be taken.

NOTE:
In compliance with Federal regulations, the results of this mammogram are being sent to the patient.

------------- REPORT [id number] -------------
[facility]
[address]
[place], [place]
We wish to report the following on your recent mammography examination. We are sending a report to your referring physician or other health care provider.
FINDING: Normal-no evidence of cancer

This statement is mandated by the [place], Department of Health.
Your examination was performed by one of our technologists, who are registered radiological technologists and also specially certified in mammography:
___
Marzan, Heron (M)

Your mammogram was interpreted by our radiologist.

( 
Apple Martha, M.D.

(Annual Breast Examination by a physician or other health care provider
(Annual Mammography Screening beginning at age 40
(Monthly Breast Self Examination

## 2023-04-06 ENCOUNTER — Ambulatory Visit (INDEPENDENT_AMBULATORY_CARE_PROVIDER_SITE_OTHER): Payer: Self-pay | Admitting: OBSTETRICS/GYNECOLOGY

## 2023-04-06 ENCOUNTER — Other Ambulatory Visit (RURAL_HEALTH_CENTER): Payer: Self-pay | Admitting: Family Medicine

## 2023-04-25 ENCOUNTER — Other Ambulatory Visit: Payer: Self-pay

## 2023-04-25 ENCOUNTER — Encounter (HOSPITAL_BASED_OUTPATIENT_CLINIC_OR_DEPARTMENT_OTHER): Payer: Self-pay

## 2023-04-25 ENCOUNTER — Emergency Department
Admission: EM | Admit: 2023-04-25 | Discharge: 2023-04-25 | Disposition: A | Discharge status: Home / Self Care | Payer: MEDICAID | Attending: Emergency Medicine | Admitting: Emergency Medicine

## 2023-04-25 ENCOUNTER — Emergency Department (HOSPITAL_BASED_OUTPATIENT_CLINIC_OR_DEPARTMENT_OTHER): Payer: MEDICAID

## 2023-04-25 DIAGNOSIS — N3001 Acute cystitis with hematuria: Secondary | ICD-10-CM

## 2023-04-25 DIAGNOSIS — Z3200 Encounter for pregnancy test, result unknown: Secondary | ICD-10-CM | POA: Insufficient documentation

## 2023-04-25 DIAGNOSIS — N200 Calculus of kidney: Secondary | ICD-10-CM

## 2023-04-25 DIAGNOSIS — K760 Fatty (change of) liver, not elsewhere classified: Secondary | ICD-10-CM | POA: Insufficient documentation

## 2023-04-25 DIAGNOSIS — N136 Pyonephrosis: Secondary | ICD-10-CM | POA: Insufficient documentation

## 2023-04-25 DIAGNOSIS — Z87442 Personal history of urinary calculi: Secondary | ICD-10-CM | POA: Insufficient documentation

## 2023-04-25 DIAGNOSIS — N132 Hydronephrosis with renal and ureteral calculous obstruction: Secondary | ICD-10-CM

## 2023-04-25 DIAGNOSIS — N23 Unspecified renal colic: Secondary | ICD-10-CM

## 2023-04-25 LAB — COMPREHENSIVE METABOLIC PANEL, NON-FASTING
ALBUMIN/GLOBULIN RATIO: 1.1 (ref 0.8–1.4)
ALBUMIN: 4.2 g/dL (ref 3.4–5.0)
ALKALINE PHOSPHATASE: 91 U/L (ref 46–116)
ALT (SGPT): 41 U/L (ref <=78)
ANION GAP: 12 mmol/L (ref 4–13)
AST (SGOT): 18 U/L (ref 15–37)
BILIRUBIN TOTAL: 0.5 mg/dL (ref 0.2–1.0)
BUN/CREA RATIO: 11
BUN: 11 mg/dL (ref 7–18)
CALCIUM, CORRECTED: 8.7 mg/dL
CALCIUM: 8.9 mg/dL (ref 8.5–10.1)
CHLORIDE: 107 mmol/L (ref 98–107)
CO2 TOTAL: 21 mmol/L (ref 21–32)
CREATININE: 0.97 mg/dL (ref 0.55–1.02)
ESTIMATED GFR: 73 mL/min/{1.73_m2} (ref >59)
GLOBULIN: 3.7
GLUCOSE: 103 mg/dL (ref 74–106)
OSMOLALITY, CALCULATED: 279 mOsm/kg (ref 270–290)
POTASSIUM: 3.5 mmol/L (ref 3.5–5.1)
PROTEIN TOTAL: 7.9 g/dL (ref 6.4–8.2)
SODIUM: 140 mmol/L (ref 136–145)

## 2023-04-25 LAB — URINALYSIS, MICROSCOPIC: RBCS: >50 /hpf — AB

## 2023-04-25 LAB — URINALYSIS, MACRO/MICRO
GLUCOSE: NEGATIVE mg/dL
KETONES: NEGATIVE mg/dL
LEUKOCYTES: NEGATIVE WBCs/uL
NITRITE: NEGATIVE
PH: 5.5 (ref 4.6–8.0)
PROTEIN: 100 mg/dL — AB
SPECIFIC GRAVITY: >=1.03 (ref 1.003–1.035)
UROBILINOGEN: 1 mg/dL (ref 0.2–1.0)

## 2023-04-25 LAB — CBC WITH DIFF
BASOPHIL #: 0.02 10*3/uL (ref 0.00–0.30)
BASOPHIL %: 0 % (ref 0–3)
EOSINOPHIL #: 0.26 10*3/uL (ref 0.00–0.80)
EOSINOPHIL %: 3 % (ref 0–7)
HCT: 44 % (ref 37.0–47.0)
HGB: 14.5 g/dL (ref 12.5–16.0)
LYMPHOCYTE #: 2.11 10*3/uL (ref 1.10–5.00)
LYMPHOCYTE %: 20 % — ABNORMAL LOW (ref 25–45)
MCH: 29.1 pg (ref 27.0–32.0)
MCHC: 33 g/dL (ref 32.0–36.0)
MCV: 88 fL (ref 78.0–99.0)
MONOCYTE #: 0.41 10*3/uL (ref 0.00–1.30)
MONOCYTE %: 4 % (ref 0–12)
MPV: 7.5 fL (ref 7.4–10.4)
NEUTROPHIL #: 7.67 10*3/uL (ref 1.80–8.40)
NEUTROPHIL %: 73 % (ref 40–76)
PLATELETS: 256 10*3/uL (ref 140–440)
RBC: 5 10*6/uL (ref 4.20–5.40)
RDW: 15.3 % — ABNORMAL HIGH (ref 11.6–14.8)
WBC: 10.5 10*3/uL (ref 4.0–10.5)

## 2023-04-25 LAB — HCG, URINE QUALITATIVE, PREGNANCY: HCG URINE QUALITATIVE: NEGATIVE

## 2023-04-25 LAB — PT/INR
INR: 0.97 (ref 0.88–1.10)
PROTHROMBIN TIME: 11.3 seconds (ref 9.8–12.7)

## 2023-04-25 LAB — LIPASE: LIPASE: 22 U/L (ref 16–77)

## 2023-04-25 LAB — PTT (PARTIAL THROMBOPLASTIN TIME): APTT: 29.3 seconds (ref 22.0–31.7)

## 2023-04-25 MED ORDER — KETOROLAC 10 MG TABLET
10.0000 mg | ORAL_TABLET | Freq: Four times a day (QID) | ORAL | 0 refills | Status: DC | PRN
Start: 2023-04-25 — End: 2023-04-27

## 2023-04-25 MED ORDER — TAMSULOSIN 0.4 MG CAPSULE
0.4000 mg | ORAL_CAPSULE | Freq: Once | ORAL | Status: AC
Start: 2023-04-25 — End: 2023-04-25
  Administered 2023-04-25: 0.4 mg via ORAL

## 2023-04-25 MED ORDER — CEFTRIAXONE 1 GRAM SOLUTION FOR INJECTION
INTRAMUSCULAR | Status: AC
Start: 2023-04-25 — End: 2023-04-25
  Filled 2023-04-25: qty 10

## 2023-04-25 MED ORDER — SODIUM CHLORIDE 0.9 % IV BOLUS
1000.0000 mL | INJECTION | Status: AC
Start: 2023-04-25 — End: 2023-04-25
  Administered 2023-04-25: 0 mL via INTRAVENOUS
  Administered 2023-04-25: 1000 mL via INTRAVENOUS

## 2023-04-25 MED ORDER — SODIUM CHLORIDE 0.9 % INTRAVENOUS PIGGYBACK
1.0000 g | INTRAVENOUS | Status: AC
Start: 2023-04-25 — End: 2023-04-25
  Administered 2023-04-25: 0 g via INTRAVENOUS
  Administered 2023-04-25: 1 g via INTRAVENOUS

## 2023-04-25 MED ORDER — PHENAZOPYRIDINE 100 MG TABLET
ORAL_TABLET | ORAL | Status: AC
Start: 2023-04-25 — End: 2023-04-25
  Filled 2023-04-25: qty 2

## 2023-04-25 MED ORDER — ONDANSETRON HCL (PF) 4 MG/2 ML INJECTION SOLUTION
4.0000 mg | INTRAMUSCULAR | Status: AC
Start: 2023-04-25 — End: 2023-04-25
  Administered 2023-04-25: 4 mg via INTRAVENOUS

## 2023-04-25 MED ORDER — TAMSULOSIN 0.4 MG CAPSULE
0.4000 mg | ORAL_CAPSULE | Freq: Every evening | ORAL | 0 refills | Status: DC
Start: 2023-04-25 — End: 2023-05-11

## 2023-04-25 MED ORDER — KETOROLAC 30 MG/ML (1 ML) INJECTION SOLUTION
INTRAMUSCULAR | Status: AC
Start: 2023-04-25 — End: 2023-04-25
  Filled 2023-04-25: qty 1

## 2023-04-25 MED ORDER — PHENAZOPYRIDINE 200 MG TABLET
200.0000 mg | ORAL_TABLET | Freq: Three times a day (TID) | ORAL | 0 refills | Status: DC
Start: 2023-04-25 — End: 2023-05-11

## 2023-04-25 MED ORDER — KETOROLAC 30 MG/ML (1 ML) INJECTION SOLUTION
30.0000 mg | INTRAMUSCULAR | Status: AC
Start: 2023-04-25 — End: 2023-04-25
  Administered 2023-04-25: 30 mg via INTRAVENOUS

## 2023-04-25 MED ORDER — CEPHALEXIN 500 MG CAPSULE
500.0000 mg | ORAL_CAPSULE | Freq: Four times a day (QID) | ORAL | 0 refills | Status: DC
Start: 2023-04-25 — End: 2023-05-11

## 2023-04-25 MED ORDER — ONDANSETRON HCL (PF) 4 MG/2 ML INJECTION SOLUTION
INTRAMUSCULAR | Status: AC
Start: 2023-04-25 — End: 2023-04-25
  Filled 2023-04-25: qty 2

## 2023-04-25 MED ORDER — ONDANSETRON 4 MG DISINTEGRATING TABLET
4.0000 mg | ORAL_TABLET | Freq: Three times a day (TID) | ORAL | 0 refills | Status: DC | PRN
Start: 2023-04-25 — End: 2023-10-24

## 2023-04-25 MED ORDER — TAMSULOSIN 0.4 MG CAPSULE
ORAL_CAPSULE | ORAL | Status: AC
Start: 2023-04-25 — End: 2023-04-25
  Filled 2023-04-25: qty 1

## 2023-04-25 MED ORDER — PHENAZOPYRIDINE 100 MG TABLET
200.0000 mg | ORAL_TABLET | ORAL | Status: AC
Start: 2023-04-25 — End: 2023-04-25
  Administered 2023-04-25: 200 mg via ORAL

## 2023-04-25 NOTE — ED Provider Notes (Signed)
[facility], [facility] Emergency Department  ED Primary Provider Note  History of Present Illness   Chief Complaint   Patient presents with    Abdominal Pain     Lindsay Crawford is a 47 y.o. female who had concerns including Abdominal Pain.  Arrival: The patient arrived by Car complaining of right CVA tenderness radiating around front of the abdomen to the right lower quadrant which started proximally 3:00 p.m. today.  Patient denies any dysuria.  Patient states  she has been having increased frequency.  Patient states 3 months ago she was found to 3 kidney stones measuring proximally 3 mm each.  She does not know whether she passed those or not.  Patient has not noticed any hematuria.  She denies any fever chills.  Patient is nauseous but is unable to vomit due to surgery to her esophagus opening for gastroesophageal reflux disease.  Patient had a nissan fundoplication.  Patient has a long history of gastroesophageal reflux disease.  She also has borderline diabetes.  Patient has hypertension as well as high cholesterol.    HPI  Review of Systems   Review of Systems   Constitutional:  Positive for activity change and appetite change. Negative for chills and fever.   HENT:  Negative for ear pain and sore throat.    Eyes:  Negative for pain and visual disturbance.   Respiratory:  Negative for cough and shortness of breath.    Cardiovascular:  Negative for chest pain and palpitations.   Gastrointestinal:  Positive for abdominal pain and nausea. Negative for vomiting.   Genitourinary:  Positive for flank pain, frequency and urgency. Negative for dysuria and hematuria.   Musculoskeletal:  Negative for arthralgias and back pain.   Skin:  Negative for color change and rash.   Neurological:  Negative for seizures and syncope.   All other systems reviewed and are negative.     Historical Data   History Reviewed This Encounter:     Physical Exam   ED Triage Vitals [04/25/23 1701]   BP  (Non-Invasive) (!) 160/91   Heart Rate 58   Respiratory Rate 16   Temperature 36 C (96.8 F)   SpO2 98 %   Weight 109 kg (240 lb)   Height 1.727 m (5\' 8" )     Physical Exam  Vitals and nursing note reviewed.   Constitutional:       General: She is not in acute distress.     Appearance: She is well-developed. She is obese.   HENT:      Head: Normocephalic and atraumatic.      Right Ear: External ear normal.      Left Ear: External ear normal.      Nose: Nose normal.      Mouth/Throat:      Mouth: Mucous membranes are dry.   Eyes:      Extraocular Movements: Extraocular movements intact.      Conjunctiva/sclera: Conjunctivae normal.      Pupils: Pupils are equal, round, and reactive to light.   Cardiovascular:      Rate and Rhythm: Normal rate and regular rhythm.      Pulses: Normal pulses.      Heart sounds: Normal heart sounds. No murmur heard.  Pulmonary:      Effort: Pulmonary effort is normal. No respiratory distress.      Breath sounds: Normal breath sounds.   Abdominal:      General: Bowel sounds are normal.  Palpations: Abdomen is soft.      Tenderness: There is abdominal tenderness. There is right CVA tenderness.      Comments: Positive tenderness right CVA with radiation of the pain to her right lower quadrant.  No guarding or rebound.   Musculoskeletal:         General: No swelling. Normal range of motion.      Cervical back: Normal range of motion and neck supple.   Skin:     General: Skin is warm and dry.      Capillary Refill: Capillary refill takes less than 2 seconds.   Neurological:      General: No focal deficit present.      Mental Status: She is alert and oriented to person, place, and time.   Psychiatric:         Mood and Affect: Mood normal.         Behavior: Behavior normal.         Thought Content: Thought content normal.       Patient Data     Labs Ordered/Reviewed   CBC WITH DIFF - Abnormal; Notable for the following components:       Result Value    RDW 15.3 (*)     LYMPHOCYTE % 20 (*)      All other components within normal limits   URINALYSIS, MACRO/MICRO - Abnormal; Notable for the following components:    COLOR Dark Yellow (*)     APPEARANCE Cloudy (*)     PROTEIN 100 (*)     BILIRUBIN Small (*)     BLOOD Large (*)     All other components within normal limits   URINALYSIS, MICROSCOPIC - Abnormal; Notable for the following components:    RBCS >50 (*)     BACTERIA Moderate (*)     MUCOUS Moderate (*)     AMORPHOUS SEDIMENT Moderate (*)     SQUAMOUS EPITHELIAL Several (*)     All other components within normal limits   LIPASE - Normal   PT/INR - Normal    Narrative:     INR OF 2.0-3.0  RECOMMENDED FOR: PROPHYLAXIS/TREATMENT OF VENEOUS THROMBOSIS, PULMONARY EMBOLISM, PREVENTION OF SYSTEMIC EMBOLISM FROM ATRIAL FIBRILATION, MYOCARDIAL INFARCTION.    INR OF 2.5-3.5  RECOMMENDED FOR MECHANICAL PROSTHETIC HEART VALVES, RECURRENT SYSTEMIC EMBOLISM, RECURRENT MYOCARDIAL INFARCTION.     PTT (PARTIAL THROMBOPLASTIN TIME) - Normal   URINE CULTURE,ROUTINE   CBC/DIFF    Narrative:     The following orders were created for panel order CBC/DIFF.  Procedure                               Abnormality         Status                     ---------                               -----------         ------                     CBC WITH ZOXW[[id number]]                Abnormal            Final result  Please view results for these tests on the individual orders.   COMPREHENSIVE METABOLIC PANEL, NON-FASTING    Narrative:     Estimated Glomerular Filtration Rate (eGFR) is calculated using the CKD-EPI (2021) equation, intended for patients 51 years of age and older. If gender is not documented or "unknown", there will be no eGFR calculation.   URINALYSIS WITH REFLEX MICROSCOPIC AND CULTURE IF POSITIVE    Narrative:     The following orders were created for panel order URINALYSIS WITH REFLEX MICROSCOPIC AND CULTURE IF POSITIVE.  Procedure                               Abnormality         Status                      ---------                               -----------         ------                     URINALYSIS, MACRO/MICRO[[id number]]      Abnormal            Final result               URINALYSIS, MICROSCOPIC[[id number]]      Abnormal            Final result                 Please view results for these tests on the individual orders.   HCG, URINE QUALITATIVE, PREGNANCY     CT ABDOMEN PELVIS WO IV CONTRAST   Final Result by Edi, Radresults In (05/27 1817)   Obstructive uropathy on the right due to a 4.9 mm mid right ureteral stone.      Fatty liver.          One or more dose reduction techniques were used (e.g., Automated exposure control, adjustment of the mA and/or kV according to patient size, use of iterative reconstruction technique).         Radiologist location ID: [id number]           Medical Decision Making        Medical Decision Making  Patient is 47 year old white female complaining of right CVA tenderness radiating around the abdomen to the right lower quadrant which started proximally 3:00 p.m.Marland Kitchen  Patient had 3 kidney stones 3 months ago measuring 3 mm each.  Patient states she has been having frequency but denies any dysuria.  Patient denies any fever chills.  Patient denies any chest pain or shortness breath.  Patient has had nausea but denies any vomiting.  Patient has had kidney stones in the past and states this feels very similar to previous.  Patient will have an IV placed with hydration.  She will also have labs and CT of the abdomen pelvis.  Patient has been given pain medication as well as an anti emetic.  Patient will be treated for results and possibly discharged home.  Patient will follow up with her urologist in the next 3-4 days.    Amount and/or Complexity of Data Reviewed  Labs: ordered.  Radiology: ordered.    Risk  Prescription drug management.  Medications Administered in the ED   NS bolus infusion 1,000 mL (1,000 mL Intravenous New Bag/New Syringe 04/25/23 1800)   cefTRIAXone  (ROCEPHIN) 1 g in NS 50 mL IVPB minibag (has no administration in time range)   tamsulosin (FLOMAX) capsule (has no administration in time range)   phenazopyridine (PYRIDIUM) tablet (has no administration in time range)   ketorolac (TORADOL) 30 mg/mL injection (30 mg Intravenous Given 04/25/23 1740)   ondansetron (ZOFRAN) 2 mg/mL injection (4 mg Intravenous Given 04/25/23 1740)     Clinical Impression   Kidney stone on right side (Primary)   Renal colic on right side   Hydronephrosis with urinary obstruction due to ureteral calculus   Acute cystitis with hematuria       Disposition: Discharged               Clinical Impression   Kidney stone on right side (Primary)   Renal colic on right side   Hydronephrosis with urinary obstruction due to ureteral calculus   Acute cystitis with hematuria       Current Discharge Medication List        START taking these medications    Details   cephalexin (KEFLEX) 500 mg Oral Capsule Take 1 Capsule (500 mg total) by mouth Four times a day for 10 days  Qty: 40 Capsule, Refills: 0      ketorolac tromethamine (TORADOL) 10 mg Oral Tablet Take 1 Tablet (10 mg total) by mouth Every 6 hours as needed for Pain  Qty: 20 Tablet, Refills: 0      phenazopyridine (PYRIDIUM) 200 mg Oral Tablet Take 1 Tablet (200 mg total) by mouth Three times a day  Qty: 9 Tablet, Refills: 0      tamsulosin (FLOMAX) 0.4 mg Oral Capsule Take 1 Capsule (0.4 mg total) by mouth Every evening after dinner for 30 days  Qty: 30 Capsule, Refills: 0

## 2023-04-25 NOTE — ED Triage Notes (Signed)
Pain rt abdomen into rt side of back since 1530 today. Pt states feels like a past kidney stone. Pt denies urine complaints at this time. States "period cramp feeling"

## 2023-04-27 ENCOUNTER — Ambulatory Visit (RURAL_HEALTH_CENTER): Payer: MEDICAID | Attending: Family Medicine | Admitting: Family Medicine

## 2023-04-27 ENCOUNTER — Ambulatory Visit: Payer: MEDICAID | Attending: Family Medicine | Admitting: Family Medicine

## 2023-04-27 ENCOUNTER — Other Ambulatory Visit: Payer: Self-pay

## 2023-04-27 ENCOUNTER — Encounter (RURAL_HEALTH_CENTER): Payer: Self-pay | Admitting: Family Medicine

## 2023-04-27 VITALS — BP 124/76 | HR 78 | Temp 99.0°F | Ht 68.0 in | Wt 241.0 lb

## 2023-04-27 DIAGNOSIS — D509 Iron deficiency anemia, unspecified: Secondary | ICD-10-CM | POA: Insufficient documentation

## 2023-04-27 DIAGNOSIS — M25561 Pain in right knee: Secondary | ICD-10-CM | POA: Insufficient documentation

## 2023-04-27 DIAGNOSIS — E785 Hyperlipidemia, unspecified: Secondary | ICD-10-CM | POA: Insufficient documentation

## 2023-04-27 DIAGNOSIS — N390 Urinary tract infection, site not specified: Secondary | ICD-10-CM | POA: Insufficient documentation

## 2023-04-27 DIAGNOSIS — K3184 Gastroparesis: Secondary | ICD-10-CM | POA: Insufficient documentation

## 2023-04-27 DIAGNOSIS — G8929 Other chronic pain: Secondary | ICD-10-CM | POA: Insufficient documentation

## 2023-04-27 DIAGNOSIS — Z5181 Encounter for therapeutic drug level monitoring: Secondary | ICD-10-CM | POA: Insufficient documentation

## 2023-04-27 DIAGNOSIS — K760 Fatty (change of) liver, not elsewhere classified: Secondary | ICD-10-CM | POA: Insufficient documentation

## 2023-04-27 DIAGNOSIS — M25562 Pain in left knee: Secondary | ICD-10-CM | POA: Insufficient documentation

## 2023-04-27 DIAGNOSIS — M545 Low back pain, unspecified: Secondary | ICD-10-CM | POA: Insufficient documentation

## 2023-04-27 DIAGNOSIS — N200 Calculus of kidney: Secondary | ICD-10-CM

## 2023-04-27 DIAGNOSIS — R7303 Prediabetes: Secondary | ICD-10-CM | POA: Insufficient documentation

## 2023-04-27 DIAGNOSIS — N1339 Other hydronephrosis: Secondary | ICD-10-CM | POA: Insufficient documentation

## 2023-04-27 LAB — LIPID PANEL
CHOL/HDL RATIO: 3.1
CHOLESTEROL: 150 mg/dL (ref <200)
HDL CHOL: 48 mg/dL (ref >=40)
LDL CALC: 81 mg/dL (ref 0–100)
TRIGLYCERIDES: 103 mg/dL (ref <=150)
VLDL CALC: 21 mg/dL (ref 0–50)

## 2023-04-27 LAB — DRUG SCREEN, WITH CONFIRMATION, URINE
AMPHET QL: NEGATIVE
BARB QL: NEGATIVE
BENZO QL: NEGATIVE
BUP QL: NEGATIVE
CANNAQL: NEGATIVE
COCQL: NEGATIVE
FENTANYL, RANDOM URINE: NEGATIVE
METHQL: NEGATIVE
OPIATE: NEGATIVE
OXYCODONE URINE: NEGATIVE
PCP QL: NEGATIVE

## 2023-04-27 LAB — COMPREHENSIVE METABOLIC PANEL, NON-FASTING
ALBUMIN/GLOBULIN RATIO: 1.5 — ABNORMAL HIGH (ref 0.8–1.4)
ALBUMIN: 4.4 g/dL (ref 3.5–5.7)
ALKALINE PHOSPHATASE: 68 U/L (ref 34–104)
ALT (SGPT): 20 U/L (ref 7–52)
ANION GAP: 7 mmol/L (ref 4–13)
AST (SGOT): 16 U/L (ref 13–39)
BILIRUBIN TOTAL: 0.3 mg/dL (ref 0.3–1.2)
BUN/CREA RATIO: 17 (ref 6–22)
BUN: 15 mg/dL (ref 7–25)
CALCIUM, CORRECTED: 9 mg/dL (ref 8.9–10.8)
CALCIUM: 9.3 mg/dL (ref 8.6–10.3)
CHLORIDE: 112 mmol/L — ABNORMAL HIGH (ref 98–107)
CO2 TOTAL: 22 mmol/L (ref 21–31)
CREATININE: 0.89 mg/dL (ref 0.60–1.30)
ESTIMATED GFR: 81 mL/min/{1.73_m2} (ref >59)
GLOBULIN: 2.9 (ref 2.9–5.4)
GLUCOSE: 113 mg/dL — ABNORMAL HIGH (ref 74–109)
OSMOLALITY, CALCULATED: 283 mOsm/kg (ref 270–290)
POTASSIUM: 3.9 mmol/L (ref 3.5–5.1)
PROTEIN TOTAL: 7.3 g/dL (ref 6.4–8.9)
SODIUM: 141 mmol/L (ref 136–145)

## 2023-04-27 LAB — CBC WITH DIFF
BASOPHIL #: 0.1 10*3/uL (ref 0.00–0.10)
BASOPHIL %: 1 % (ref 0–1)
EOSINOPHIL #: 0.3 10*3/uL (ref 0.00–0.50)
EOSINOPHIL %: 4 %
HCT: 40 % (ref 31.2–41.9)
HGB: 13.6 g/dL (ref 10.9–14.3)
LYMPHOCYTE #: 2 10*3/uL (ref 1.00–3.00)
LYMPHOCYTE %: 24 % (ref 16–44)
MCH: 29.4 pg (ref 24.7–32.8)
MCHC: 33.9 g/dL (ref 32.3–35.6)
MCV: 86.9 fL (ref 75.5–95.3)
MONOCYTE #: 0.5 10*3/uL (ref 0.30–1.00)
MONOCYTE %: 6 % (ref 5–13)
MPV: 8.3 fL (ref 7.9–10.8)
NEUTROPHIL #: 5.4 10*3/uL (ref 1.85–7.80)
NEUTROPHIL %: 65 % (ref 43–77)
PLATELETS: 257 10*3/uL (ref 140–440)
RBC: 4.6 10*6/uL (ref 3.63–4.92)
RDW: 13.4 % (ref 12.3–17.7)
WBC: 8.4 10*3/uL (ref 3.8–11.8)

## 2023-04-27 LAB — IRON TRANSFERRIN AND TIBC
IRON (TRANSFERRIN) SATURATION: 32 % (ref 15–50)
IRON: 114 ug/dL (ref 50–212)
TOTAL IRON BINDING CAPACITY: 356 ug/dL (ref 250–450)
TRANSFERRIN: 254 mg/dL (ref 203–362)
UIBC: 242 ug/dL (ref 130–375)

## 2023-04-27 LAB — URINE CULTURE,ROUTINE: URINE CULTURE: 20000 — AB

## 2023-04-27 MED ORDER — ROSUVASTATIN 5 MG TABLET
5.0000 mg | ORAL_TABLET | Freq: Every day | ORAL | 3 refills | Status: AC
Start: 2023-04-27 — End: 2024-04-26

## 2023-04-27 MED ORDER — BETHANECHOL CHLORIDE 5 MG TABLET
5.0000 mg | ORAL_TABLET | Freq: Two times a day (BID) | ORAL | 3 refills | Status: DC
Start: 2023-04-27 — End: 2023-05-26

## 2023-04-27 MED ORDER — LINACLOTIDE 145 MCG CAPSULE
145.0000 ug | ORAL_CAPSULE | Freq: Every morning | ORAL | 3 refills | Status: DC
Start: 2023-04-27 — End: 2024-05-28

## 2023-04-27 MED ORDER — FAMOTIDINE 40 MG TABLET
40.0000 mg | ORAL_TABLET | Freq: Every evening | ORAL | 3 refills | Status: DC
Start: 2023-04-27 — End: 2023-05-26

## 2023-04-27 MED ORDER — CYCLOBENZAPRINE 10 MG TABLET
10.0000 mg | ORAL_TABLET | Freq: Two times a day (BID) | ORAL | 3 refills | Status: DC
Start: 2023-04-27 — End: 2023-05-26

## 2023-04-27 MED ORDER — QUETIAPINE 100 MG TABLET
100.0000 mg | ORAL_TABLET | Freq: Two times a day (BID) | ORAL | 3 refills | Status: DC
Start: 2023-04-27 — End: 2024-06-25

## 2023-04-27 MED ORDER — CHOLECALCIFEROL (VITAMIN D3) 1,250 MCG (50,000 UNIT) CAPSULE
50000.0000 [IU] | ORAL_CAPSULE | ORAL | 3 refills | Status: DC
Start: 2023-04-27 — End: 2023-05-26

## 2023-04-27 MED ORDER — GABAPENTIN 300 MG CAPSULE
300.0000 mg | ORAL_CAPSULE | Freq: Three times a day (TID) | ORAL | 1 refills | Status: DC
Start: 2023-04-27 — End: 2023-06-20

## 2023-04-27 MED ORDER — NURTEC ODT 75 MG DISINTEGRATING TABLET
75.0000 mg | ORAL_TABLET | ORAL | 11 refills | Status: DC
Start: 2023-04-27 — End: 2023-05-11

## 2023-04-27 MED ORDER — CETIRIZINE 10 MG TABLET
10.0000 mg | ORAL_TABLET | Freq: Every day | ORAL | 3 refills | Status: DC
Start: 2023-04-27 — End: 2024-04-24

## 2023-04-27 MED ORDER — METOCLOPRAMIDE 5 MG TABLET
5.0000 mg | ORAL_TABLET | Freq: Every day | ORAL | 3 refills | Status: DC
Start: 2023-04-27 — End: 2023-09-21
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
## 2023-04-28 LAB — HGA1C (HEMOGLOBIN A1C WITH EST AVG GLUCOSE): HEMOGLOBIN A1C: 5.4 % (ref 4.0–6.0)

## 2023-04-29 LAB — GABAPENTIN, SERUM: GABAPENTIN: 0.6 ug/mL

## 2023-05-04 ENCOUNTER — Ambulatory Visit (INDEPENDENT_AMBULATORY_CARE_PROVIDER_SITE_OTHER): Payer: MEDICAID | Admitting: Physician Assistant

## 2023-05-04 ENCOUNTER — Other Ambulatory Visit: Payer: Self-pay

## 2023-05-04 ENCOUNTER — Encounter (INDEPENDENT_AMBULATORY_CARE_PROVIDER_SITE_OTHER): Payer: Self-pay | Admitting: Physician Assistant

## 2023-05-04 VITALS — BP 144/78 | HR 89 | Ht 68.0 in | Wt 241.0 lb

## 2023-05-04 DIAGNOSIS — N132 Hydronephrosis with renal and ureteral calculous obstruction: Secondary | ICD-10-CM

## 2023-05-04 DIAGNOSIS — N1330 Unspecified hydronephrosis: Secondary | ICD-10-CM

## 2023-05-04 DIAGNOSIS — N201 Calculus of ureter: Secondary | ICD-10-CM

## 2023-05-04 DIAGNOSIS — N200 Calculus of kidney: Secondary | ICD-10-CM

## 2023-05-04 NOTE — Progress Notes (Signed)
UROLOGY, [facility]  [address] [place] [address]    Progress Note    Name: Lindsay Crawford MRN:  [id number]   Date: 05/04/2023 DOB:  Jan 22, 1976 (46 y.o.)             Chief Complaint: New Patient (Kidney stone on Right , 4.36mm moderate hydronephrosis)  Subjective   Subjective:   Lindsay Crawford is a pleasant 47 y.o. White female whom presents to the clinic for nephrolithiasis. Patient with right flank and right abdominal pain x 2 weeks. Patient seen at [facility] ER and CT abdomen pelvis revealed right 5mm with right hydronephrosis. Urine culture 20,0000 mixed flora; currently taking  Keflex 500 mg QID and Flomax.History renal calcui without extractions.  Patient denies personal or family history of urinary malignancy. Patient denies occupational chemical exposure. Patient denies history of tobacco use.         Objective   Objective :  BP (!) 144/78 (Site: Right, Patient Position: Sitting, Cuff Size: Adult)   Pulse 89   Ht 1.727 m (5\' 8" )   Wt 109 kg (241 lb)   BMI 36.64 kg/m       Gen: NAD, alert  Pulm: unlabored at rest  CV: palpable pulses  Abd: soft, Nt/ND  GU: no suprapubic tenderness, no CVAT    Data reviewed:    Current Outpatient Medications   Medication Sig    albuterol sulfate (PROVENTIL OR VENTOLIN OR PROAIR) 90 mcg/actuation Inhalation oral inhaler Take 2 Puffs by inhalation Every 6 hours as needed    bethanechol chloride (URECHOLINE) 5 mg Oral Tablet Take 1 Tablet (5 mg total) by mouth Twice daily    Biotin 10 mg Oral Tablet Take 1 Tablet (10 mg total) by mouth Once a day    cephalexin (KEFLEX) 500 mg Oral Capsule Take 1 Capsule (500 mg total) by mouth Four times a day for 10 days    cetirizine (ZYRTEC) 10 mg Oral Tablet Take 1 Tablet (10 mg total) by mouth Once a day    cholecalciferol, vitamin D3, 1,250 mcg (50,000 unit) Oral Capsule Take 1 Capsule (50,000 Units total) by mouth Every 7 days    cyanocobalamin (VITAMIN B 12) 1,000 mcg Oral Tablet Take 1 Tablet (1,000 mcg  total) by mouth Once a day    cyclobenzaprine (FLEXERIL) 10 mg Oral Tablet Take 1 Tablet (10 mg total) by mouth Twice daily    famotidine (PEPCID) 40 mg Oral Tablet Take 1 Tablet (40 mg total) by mouth Every evening    gabapentin (NEURONTIN) 300 mg Oral Capsule Take 1 Capsule (300 mg total) by mouth Three times a day for 180 days Indications: 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 CALL 911. REPEAT AFTER 3 MINUTES IF NO RESPONSE.    omeprazole (PRILOSEC) 20 mg Oral Capsule, Delayed Release(E.C.) TAKE  1 CAPSULE BY MOUTH TWICE DAILY AS DIRECTED    ondansetron (ZOFRAN ODT) 4 mg Oral Tablet, Rapid Dissolve Take 1 Tablet (4 mg total) by mouth Every 8 hours as needed for Nausea/Vomiting    phenazopyridine (PYRIDIUM) 200 mg Oral Tablet Take 1 Tablet (200 mg total) by mouth Three times a day    QUEtiapine (SEROQUEL) 100 mg Oral Tablet Take 1 Tablet (100 mg total) by mouth Twice daily    rimegepant (NURTEC ODT) 75 mg Oral Tablet, Rapid Dissolve Place 1 Tablet (75 mg total) under the tongue Every 48 hours Indications: migraine prevention    rosuvastatin (CRESTOR) 5 mg Oral Tablet Take 1 Tablet (5 mg total) by mouth Once a day    tamsulosin (FLOMAX) 0.4 mg Oral Capsule Take 1 Capsule (0.4 mg total) by mouth Every evening after dinner for 30 days        Assessment/Plan  Problem List Items Addressed This Visit    None  Right mid 5mm ureterolithiasis with right hydronephrosis,  punctate calculi in upper pole; punctate left renal calculus  I discussed the differential diagnosis, pathophysiology and nature of urolithiasis and recurrent stone disease.  Patient was counseled on available treatment options in the management of their calculus.  Conservative, medical expulsive therapy consisting of alpha-blocker therapy, analgesia and directed fluids which is generally reserved for ureteral stones <5 mm without complicating factors (infection, intractable pain/nausea)  The patient was counseled regarding treatment options for urinary calculi including observation, extracorporeal shockwave lithotripsy (ESWL), ureteroscopic extraction/intracorporeal laser lithotripsy, percutaneous nephrolithotomy (PCNL), or open ureterolithotomy/nephrolithotomy. The risks, advantages, and disadvantages of each were discussed.   Patient understands that the risks include but are not limited to bleeding, infection, damage to adjacent tissues, renal fracture/contusion/hemorrhage, anesthesia risks, loss of the kidney, steinstrasse, therapeutic failure, possible need for post-operative drains/stents, need for additional procedures.  After further discussion, patient has elected to undergo cystoscopy, ureteroscopy with laser lithotripsy, stone manipulation and ureteral stent insertion at the next available date;  patient understands that more emergent intervention will be indicated should they develop fevers/urinary tract infection, intractable pain and/or nausea     Recurrent Nephrolithiasis  I discussed the differential diagnosis, pathophysiology and nature of asymptomatic non-obstructing renal stones as well as the natural history  Based on the current best-available data [J Urol. 2015 Apr;193(4):1265-9] with an average follow-up of 3.5 years:  Approximately 60% of patients will remain asymptomatic  Less than 30% will experience renal colic  Less than 20% will require operative intervention for pain  Spontaneous stone  passage will occur in 7% of cases  Importantly, patient was counseled on risk of silent obstruction causing hydronephrosis and requiring intervention    Patient was counseled on the need for possible future treatment for urinary calculi including observation, extracorporeal shockwave lithotripsy (ESWL), ureteroscopic extraction/intracorporeal laser lithotripsy, percutaneous nephrolithotomy (PCNL), or open ureterolithotomy/nephrolithotomy. The risks, advantages, and disadvantages of each were discussed.   I reviewed the recent 2014 American Urological Association guideline on "Medical Management of Kidney Stones" and specifically discussed dietary therapies including:  Increase fluid intake to achieve urine output of at least 2.5 liters daily  Limit sodium intake to no more than 100 mEq (2,300 mg) per day  Consume 1,000-1,200 mg of dietary calcium per day  Limit oxalate-rich foods (beets, spinach, rhubarb, nuts, chocolate)  Limit non-dairy animal protein  Encouraged incresed fruit and vegetable intake  Patient was provided with written stone prevention recommendations.  Patient was also counseled on the benefits of further serum and 24  hour urine testing to identify the metabolic disorders responsible for recurrent stone disease   24h Urinalysis 1 month sp stone extraction    Quintessa Simmerman, PA-C

## 2023-05-05 ENCOUNTER — Telehealth (RURAL_HEALTH_CENTER): Payer: Self-pay | Admitting: Family Medicine

## 2023-05-05 ENCOUNTER — Other Ambulatory Visit: Payer: Self-pay

## 2023-05-05 NOTE — Telephone Encounter (Signed)
Patient is having kidney stone surgery next week she has concerns with her gabapentin so not to mess up medication with our office please advise/br

## 2023-05-07 NOTE — H&P (Signed)
[facility]    Urology history and physical      Lindsay Crawford, Lindsay Crawford, 47 y.o. female  Encounter Start Date:  (Not on file)  Inpatient Admission Date:    Date of Service:  05/07/2023  Date of Birth:  10/14/1976    PCP: Weyman Pedro, DO.    Information Obtained from: patient  Chief Complaint:  Right obstructing stone       HPI:    Lindsay Crawford is a 47 y.o., White female who presents with Lindsay Crawford is a pleasant 47 y.o. White female whom presents to the clinic for nephrolithiasis. Patient with right flank and right abdominal pain x 2 weeks. Patient seen at [facility] ER and CT abdomen pelvis revealed right 5mm with right hydronephrosis        ROS:     ROS Other than ROS in the HPI, all other systems were negative.      PAST MEDICAL/ FAMILY/ SOCIAL HISTORY:    Reviewed/ Summarized records and/or obtained History from patient  Past Medical History:   Diagnosis Date    Body mass index 35.0-35.9, adult     Borderline diabetes     Chronic low back pain     Dizziness     DUB (dysfunctional uterine bleeding)     Gastroparesis     GERD (gastroesophageal reflux disease)     Hyperglycemia     Hyperlipidemia LDL goal <70     Insomnia     Knee pain, bilateral     Migraine     Paraspinal muscle spasm     Vitamin D deficiency          Allergies   Allergen Reactions    Other Rash     Allergic to the sun       Cannot display prior to admission medications because the patient has not been admitted in this contact.           No current facility-administered medications for this encounter.    Past Surgical History:   Procedure Laterality Date    CESAREAN SECTION  2004    COLONOSCOPY      Dr. Lequita Halt in [place]    ENDOMETRIAL ABLATION W/ NOVASURE      dr brodnik 12/14/22    ESOPHAGOSCOPY / EGD      HX APPENDECTOMY      HX BACK SURGERY      HX LAP CHOLECYSTECTOMY      LAPAROSCOPIC TUBAL LIGATION  07/30/2022    with D&C brodmik    NISSEN FUNDOPLICATION  08/13/2022    had in [place]         Social History     Tobacco Use     Smoking status: Never    Smokeless tobacco: Never   Vaping Use    Vaping status: Never Used   Substance Use Topics    Alcohol use: Never    Drug use: Never       PHYSICAL EXAMINATION:         Gen: NAD, alert  Pulm: unlabored at rest  CV: palpable pulses  Abd: soft, Nt/ND  GU: no suprapubic tenderness, no CVAT         Impression:   5 mm right mid obstructing stone    Recommendations:      1. Reviewed CT abdomen and pelvis  2. Patient undergo cystoscopy/right retrograde pyelogram, ureteroscopy laser lithotripsy stone basketing and stent placement  3. NPO/antibiotics    Theodoro Kalata,  DO

## 2023-05-10 NOTE — Telephone Encounter (Signed)
Patient will let us know what she is given surgery tomorrow/br

## 2023-05-11 ENCOUNTER — Encounter (HOSPITAL_COMMUNITY): Payer: Self-pay | Admitting: UROLOGY

## 2023-05-11 ENCOUNTER — Ambulatory Visit (HOSPITAL_COMMUNITY): Payer: MEDICAID | Admitting: Anesthesiology

## 2023-05-11 ENCOUNTER — Inpatient Hospital Stay
Admission: RE | Admit: 2023-05-11 | Discharge: 2023-05-11 | Disposition: A | Discharge status: Home / Self Care | Payer: MEDICAID | Source: Ambulatory Visit | Attending: UROLOGY | Admitting: UROLOGY

## 2023-05-11 ENCOUNTER — Other Ambulatory Visit (HOSPITAL_COMMUNITY): Payer: MEDICAID

## 2023-05-11 ENCOUNTER — Encounter (HOSPITAL_COMMUNITY)
Admission: RE | Disposition: A | Discharge status: Home / Self Care | Payer: Self-pay | Source: Ambulatory Visit | Attending: UROLOGY

## 2023-05-11 ENCOUNTER — Encounter (HOSPITAL_COMMUNITY): Payer: MEDICAID | Admitting: UROLOGY

## 2023-05-11 ENCOUNTER — Other Ambulatory Visit: Payer: Self-pay

## 2023-05-11 DIAGNOSIS — R519 Headache, unspecified: Secondary | ICD-10-CM | POA: Insufficient documentation

## 2023-05-11 DIAGNOSIS — E785 Hyperlipidemia, unspecified: Secondary | ICD-10-CM | POA: Insufficient documentation

## 2023-05-11 DIAGNOSIS — E669 Obesity, unspecified: Secondary | ICD-10-CM | POA: Insufficient documentation

## 2023-05-11 DIAGNOSIS — K219 Gastro-esophageal reflux disease without esophagitis: Secondary | ICD-10-CM | POA: Insufficient documentation

## 2023-05-11 DIAGNOSIS — N200 Calculus of kidney: Secondary | ICD-10-CM

## 2023-05-11 DIAGNOSIS — Z6836 Body mass index (BMI) 36.0-36.9, adult: Secondary | ICD-10-CM | POA: Insufficient documentation

## 2023-05-11 LAB — HCG, URINE QUALITATIVE, PREGNANCY: HCG URINE QUALITATIVE: NEGATIVE

## 2023-05-11 SURGERY — CYSTOSCOPY WITH LASER LITHOTRIPSY
Anesthesia: General | Site: Ureter | Laterality: Right | Wound class: Clean Contaminated Wounds-The respiratory, GI, Genital, or urinary

## 2023-05-11 MED ORDER — LIDOCAINE (PF) 100 MG/5 ML (2 %) INTRAVENOUS SYRINGE
INJECTION | Freq: Once | INTRAVENOUS | Status: DC | PRN
Start: 2023-05-11 — End: 2023-05-11
  Administered 2023-05-11: 80 mg via INTRAVENOUS

## 2023-05-11 MED ORDER — SODIUM CHLORIDE 0.9 % (FLUSH) INJECTION SYRINGE
3.0000 mL | INJECTION | Freq: Three times a day (TID) | INTRAMUSCULAR | Status: DC
Start: 2023-05-11 — End: 2023-05-11

## 2023-05-11 MED ORDER — IPRATROPIUM 0.5 MG-ALBUTEROL 3 MG (2.5 MG BASE)/3 ML NEBULIZATION SOLN
3.0000 mL | INHALATION_SOLUTION | Freq: Once | RESPIRATORY_TRACT | Status: DC | PRN
Start: 2023-05-11 — End: 2023-05-11

## 2023-05-11 MED ORDER — CEFAZOLIN 2 GRAM INTRAVENOUS SOLUTION
INTRAVENOUS | Status: AC
Start: 2023-05-11 — End: 2023-05-11
  Filled 2023-05-11: qty 14.71

## 2023-05-11 MED ORDER — LACTATED RINGERS INTRAVENOUS SOLUTION
INTRAVENOUS | Status: DC | PRN
Start: 2023-05-11 — End: 2023-05-11
  Administered 2023-05-11: 0 via INTRAVENOUS

## 2023-05-11 MED ORDER — SODIUM CHLORIDE 0.9 % INTRAVENOUS PIGGYBACK
INJECTION | INTRAVENOUS | Status: AC
Start: 2023-05-11 — End: 2023-05-11
  Filled 2023-05-11: qty 100

## 2023-05-11 MED ORDER — LACTATED RINGERS INTRAVENOUS SOLUTION
INTRAVENOUS | Status: DC
Start: 2023-05-11 — End: 2023-05-11

## 2023-05-11 MED ORDER — CEFAZOLIN 2 GRAM INTRAVENOUS SOLUTION
2.0000 g | Freq: Once | INTRAVENOUS | Status: DC
Start: 2023-05-11 — End: 2023-05-11

## 2023-05-11 MED ORDER — PROCHLORPERAZINE EDISYLATE 10 MG/2 ML (5 MG/ML) INJECTION SOLUTION
5.0000 mg | Freq: Once | INTRAMUSCULAR | Status: DC | PRN
Start: 2023-05-11 — End: 2023-05-11

## 2023-05-11 MED ORDER — FAMOTIDINE (PF) 20 MG/2 ML INTRAVENOUS SOLUTION
INTRAVENOUS | Status: AC
Start: 2023-05-11 — End: 2023-05-11
  Filled 2023-05-11: qty 2

## 2023-05-11 MED ORDER — ONDANSETRON HCL (PF) 4 MG/2 ML INJECTION SOLUTION
INTRAMUSCULAR | Status: AC
Start: 2023-05-11 — End: 2023-05-11
  Filled 2023-05-11: qty 2

## 2023-05-11 MED ORDER — ONDANSETRON HCL (PF) 4 MG/2 ML INJECTION SOLUTION
4.0000 mg | Freq: Once | INTRAMUSCULAR | Status: AC
Start: 2023-05-11 — End: 2023-05-11
  Administered 2023-05-11: 4 mg via INTRAVENOUS

## 2023-05-11 MED ORDER — SUGAMMADEX 100 MG/ML INTRAVENOUS SOLUTION
Freq: Once | INTRAVENOUS | Status: DC | PRN
Start: 2023-05-11 — End: 2023-05-11
  Administered 2023-05-11: 300 mg via INTRAVENOUS

## 2023-05-11 MED ORDER — DEXAMETHASONE SODIUM PHOSPHATE 4 MG/ML INJECTION SOLUTION
INTRAMUSCULAR | Status: AC
Start: 2023-05-11 — End: 2023-05-11
  Filled 2023-05-11: qty 1

## 2023-05-11 MED ORDER — MIDAZOLAM 5 MG/ML INJECTION WRAPPER
3.0000 mg | Freq: Once | INTRAMUSCULAR | Status: DC | PRN
Start: 2023-05-11 — End: 2023-05-11
  Administered 2023-05-11: 3 mg via INTRAVENOUS

## 2023-05-11 MED ORDER — PROPOFOL 10 MG/ML IV BOLUS
INJECTION | Freq: Once | INTRAVENOUS | Status: DC | PRN
Start: 2023-05-11 — End: 2023-05-11
  Administered 2023-05-11: 150 mg via INTRAVENOUS

## 2023-05-11 MED ORDER — FAMOTIDINE (PF) 20 MG/2 ML INTRAVENOUS SOLUTION
20.0000 mg | Freq: Once | INTRAVENOUS | Status: AC
Start: 2023-05-11 — End: 2023-05-11
  Administered 2023-05-11: 20 mg via INTRAVENOUS

## 2023-05-11 MED ORDER — FENTANYL (PF) 50 MCG/ML INJECTION WRAPPER
50.0000 ug | INJECTION | INTRAMUSCULAR | Status: DC | PRN
Start: 2023-05-11 — End: 2023-05-11

## 2023-05-11 MED ORDER — FENTANYL (PF) 50 MCG/ML INJECTION WRAPPER
25.0000 ug | INJECTION | INTRAMUSCULAR | Status: DC | PRN
Start: 2023-05-11 — End: 2023-05-11

## 2023-05-11 MED ORDER — MIDAZOLAM 5 MG/ML INJECTION WRAPPER
INTRAMUSCULAR | Status: AC
Start: 2023-05-11 — End: 2023-05-11
  Filled 2023-05-11: qty 1

## 2023-05-11 MED ORDER — FENTANYL (PF) 50 MCG/ML INJECTION SOLUTION
INTRAMUSCULAR | Status: AC
Start: 2023-05-11 — End: 2023-05-11
  Filled 2023-05-11: qty 2

## 2023-05-11 MED ORDER — ALBUTEROL SULFATE 2.5 MG/3 ML (0.083 %) SOLUTION FOR NEBULIZATION
2.5000 mg | INHALATION_SOLUTION | Freq: Once | RESPIRATORY_TRACT | Status: DC | PRN
Start: 2023-05-11 — End: 2023-05-11

## 2023-05-11 MED ORDER — SODIUM CHLORIDE 0.9 % (FLUSH) INJECTION SYRINGE
3.0000 mL | INJECTION | INTRAMUSCULAR | Status: DC | PRN
Start: 2023-05-11 — End: 2023-05-11

## 2023-05-11 MED ORDER — KETOROLAC 30 MG/ML (1 ML) INJECTION SOLUTION
Freq: Once | INTRAMUSCULAR | Status: DC | PRN
Start: 2023-05-11 — End: 2023-05-11
  Administered 2023-05-11: 30 mg via INTRAVENOUS

## 2023-05-11 MED ORDER — FENTANYL (PF) 50 MCG/ML INJECTION WRAPPER
INJECTION | Freq: Once | INTRAMUSCULAR | Status: DC | PRN
Start: 2023-05-11 — End: 2023-05-11
  Administered 2023-05-11: 50 ug via INTRAVENOUS

## 2023-05-11 MED ORDER — DEXMEDETOMIDINE 100 MCG/ML INTRAVENOUS SOLUTION
INTRAVENOUS | Status: AC
Start: 2023-05-11 — End: 2023-05-11
  Filled 2023-05-11: qty 2

## 2023-05-11 MED ORDER — DOCUSATE SODIUM 100 MG CAPSULE
100.0000 mg | ORAL_CAPSULE | Freq: Two times a day (BID) | ORAL | 0 refills | Status: DC | PRN
Start: 2023-05-11 — End: 2023-06-14

## 2023-05-11 MED ORDER — CEFAZOLIN 1 GRAM SOLUTION FOR INJECTION
Freq: Once | INTRAMUSCULAR | Status: DC | PRN
Start: 2023-05-11 — End: 2023-05-11
  Administered 2023-05-11: 2000 mg via INTRAVENOUS

## 2023-05-11 MED ORDER — ONDANSETRON HCL (PF) 4 MG/2 ML INJECTION SOLUTION
4.0000 mg | Freq: Once | INTRAMUSCULAR | Status: DC | PRN
Start: 2023-05-11 — End: 2023-05-11

## 2023-05-11 MED ORDER — ROCURONIUM 10 MG/ML INTRAVENOUS SOLUTION
Freq: Once | INTRAVENOUS | Status: DC | PRN
Start: 2023-05-11 — End: 2023-05-11
  Administered 2023-05-11: 40 mg via INTRAVENOUS

## 2023-05-11 MED ORDER — KETOROLAC 10 MG TABLET
10.0000 mg | ORAL_TABLET | Freq: Four times a day (QID) | ORAL | 0 refills | Status: DC | PRN
Start: 2023-05-11 — End: 2023-06-14

## 2023-05-11 MED ORDER — DEXAMETHASONE SODIUM PHOSPHATE 4 MG/ML INJECTION SOLUTION
4.0000 mg | Freq: Once | INTRAMUSCULAR | Status: AC
Start: 2023-05-11 — End: 2023-05-11
  Administered 2023-05-11: 4 mg via INTRAVENOUS

## 2023-05-11 SURGICAL SUPPLY — 45 items
ADAPTER CATH 9FR SLFSL FEMALE LL CKFL STRL DISP (UROLOGICAL SUPPLIES) ×2 IMPLANT
BAG DRAIN STRL SKTRN CSTL (UROLOGICAL SUPPLIES) ×1 IMPLANT
BASKET STONE RTRVL 120CM 12MM 1.9FR 0TP URET NITINOL 4WR FLAT DIST SURF KNT TPLS STRL LF  DISP (UROLOGICAL SUPPLIES) ×1 IMPLANT
CATH URET SOF-FLX 5FR 70CM OP-EN ACPT .04IN GW (UROLOGICAL SUPPLIES) IMPLANT
CONV USE ITEM 339060 - PACK SURG CSCP STRL TBRN LF (CUSTOM TRAYS & PACK) ×1 IMPLANT
DETERGENT INSTR 22OZ TRNSPT GEL RINSE FREE NEUT PH PREKLENZ CLR PLSNT LF (MISCELLANEOUS PT CARE ITEMS) ×1 IMPLANT
DILATOR URET NTNGHM 1STP DDRGL 70CM 6-12FR STRL LF  DISP (MED SURG SUPPLIES) IMPLANT
FIBER LASER DORNIER SINGLEFLEX 270UM HO FLAT TIP DISP (SURGICAL LASER SUPPLIES) IMPLANT
GLOVE SURG 6 LF  PF BEAD CUF STRL CRM 11.3IN PROTEXIS PLISPRN THK9.1 MIL (GLOVES AND ACCESSORIES) IMPLANT
GLOVE SURG 6 LF  PF SMOOTH BEAD CUF INTLK STRL BLU 11.3IN PROTEXIS NEU-THERA PLISPRN THK7.9 MIL (GLOVES AND ACCESSORIES) IMPLANT
GLOVE SURG 6.5 LF  PF BEAD CUF STRL CRM 11.3IN PROTEXIS PI PLISPRN THK9.1 MIL (GLOVES AND ACCESSORIES) ×1 IMPLANT
GLOVE SURG 6.5 LF  PF SMOOTH BEAD CUF INTLK STRL BLU 11.3IN PROTEXIS NEU-THERA PLISPRN THK7.9 MIL (GLOVES AND ACCESSORIES) ×2 IMPLANT
GLOVE SURG 6.5 LTX PF SMOOTH BEAD CUF STRL YW 11.5IN PROTEXIS NEU-THERA DDRGL THK8.7 MIL (GLOVES AND ACCESSORIES) IMPLANT
GLOVE SURG 7 LF  PF BEAD CUF STRL CRM 11.8IN PROTEXIS PI PLISPRN THK9.1 MIL (GLOVES AND ACCESSORIES) IMPLANT
GLOVE SURG 7 LF  PF SMOOTH BEAD CUF INTLK STRL BLU 11.8IN PROTEXIS NEU-THERA PLISPRN THK7.9 MIL (GLOVES AND ACCESSORIES) IMPLANT
GLOVE SURG 7 LTX PF SMOOTH BEAD CUF STRL YW 12IN PROTEXIS NEU-THERA DDRGL THK8.7 MIL (GLOVES AND ACCESSORIES) IMPLANT
GLOVE SURG 7.5 LF  PF BEAD CUF STRL CRM 11.8IN PROTEXIS PI PLISPRN THK9.1 MIL (GLOVES AND ACCESSORIES) ×1 IMPLANT
GLOVE SURG 7.5 LF  PF SMOOTH BEAD CUF INTLK STRL BLU 11.8IN PROTEXIS NEU-THERA PLISPRN THK7.9 MIL (GLOVES AND ACCESSORIES) IMPLANT
GLOVE SURG 7.5 LTX PF SMOOTH BEAD CUF STRL YW 12IN PROTEXIS (GLOVES AND ACCESSORIES) IMPLANT
GLOVE SURG 8 LF  PF BEAD CUF STRL CRM 11.8IN PROTEXIS PI PLISPRN THK9.1 MIL (GLOVES AND ACCESSORIES) IMPLANT
GLOVE SURG 8 LF  PF SMOOTH BEAD CUF INTLK STRL BLU 11.8IN PROTEXIS NEU-THERA PLISPRN THK7.9 MIL (GLOVES AND ACCESSORIES) IMPLANT
GLOVE SURG 8.5 LF  PF BEAD CUF STRL CRM 11.8IN PROTEXIS PI PLISPRN THK9.1 MIL (GLOVES AND ACCESSORIES) IMPLANT
GOWN SURG 2XL XLNG LGTH L3 HKLP CLSR RGLN SLEEVE TWL STRL LF  DISP GRN AERO BLU PRFRM FBRC (DRAPE/PACKS/SHEETS/OR TOWEL) ×1 IMPLANT
GOWN SURG LRG STD LGTH REG L3 NONREINFORCE BRTHBL TWL STRL LF  DISP BLU HALYARD SPECTRUM SMS (DRAPE/PACKS/SHEETS/OR TOWEL) ×1 IMPLANT
GW URO .035IN 150CM 3CM SENSOR STR FLX TP RADOPQ NITINOL SS HDRPH PTFE URET LF (UROLOGICAL SUPPLIES) IMPLANT
GW URO .035IN 150CM 3CM ZIPWR STR RADOPQ STD SHAFT NITINOL HDRPH URET LF (UROLOGICAL SUPPLIES) IMPLANT
GW URO .035IN 150CM 3CM ZIPWR STR RADOPQ STD SHAFT STRBL KINK RST NITINOL POLYUR HDRPH URET (UROLOGICAL SUPPLIES) ×1 IMPLANT
LABEL MED CORRECT MED LABELING SYS 4 FLG 2 SHEET 24 PRPRNT STRL (MED SURG SUPPLIES) ×1 IMPLANT
MAT INSTR TRY 44X36IN WTPRF BACKSHEET TPNX BLU (MISCELLANEOUS PT CARE ITEMS) ×1 IMPLANT
PACK SURG CSCP STRL TBRN LF (CUSTOM TRAYS & PACK) ×1
SET .241IN 96IN 2 NONVENT PIERCE PIN 2 PINCH CLAMP SIGHT CHAMBER Y CONN IRRG 85ML TUR IV STRL LF (IV TUBING & ACCESSORIES) ×1 IMPLANT
SHEATH ACCESS 12FR CONT WRK CHNL FLXB SCP DILATION FLEXOR URET 45CM STRL DISP (UROLOGICAL SUPPLIES) IMPLANT
SHEATH ACCESS 12FR DILATION CONT WRK CHNL SM FLXB SCP LFRIC SURF FLEXOR URET HDRPH 35CM STRL LF (UROLOGICAL SUPPLIES) IMPLANT
SHEATH ACCESS 12FR DILATION FLXB SCP FLEXOR URET AQ 28CM STRL LTX DISP (UROLOGICAL SUPPLIES) ×1 IMPLANT
SOL IRRG 0.9% NACL 2000ML PRSV FR N-PYRG FLXB CONTAINR STRL LF (MEDICATIONS/SOLUTIONS) ×1 IMPLANT
SOL IV 0.9% NACL 1000ML STRL PRSV FR FLXB CONTAINR LF (MEDICATIONS/SOLUTIONS) ×1 IMPLANT
SPONGE GAUZE 4X4IN MDCHC COTTON 12 PLY TY 7 LF  STRL DISP (WOUND CARE SUPPLY) ×1 IMPLANT
STENT CONTOUR 6FR 22CM URET TAPER TIP BLADDER MRK LOW PROF BRD SUT PRCFLX HDR+ PGTL CURVE LF (STENTS UROLOGICAL)
STENT CONTOUR 6FR 24CM URET TAPER TIP BLADDER MRK LOW PROF BRD SUT PRCFLX HDR+ PGTL CURVE LF (STENTS UROLOGICAL)
STENT CONTOUR 6FR 26CM URET TAPER TIP BLADDER MRK LOW PROF BRD SUT PRCFLX HDR+ PGTL CURVE LF (STENTS UROLOGICAL)
STENT CONTOUR 6FR 28CM URET TAPER TIP BLADDER MRK LOW PROF BRD SUT PRCFLX HDR+ PGTL CURVE LF (STENTS UROLOGICAL)
SYRINGE LL 10ML LF  STRL GRAD N-PYRG DEHP-FR PVC FREE MED DISP (MED SURG SUPPLIES) ×1 IMPLANT
SYRINGE LL 20ML STRL GRAD MED DISP (MED SURG SUPPLIES) ×1 IMPLANT
TOWEL 24X16IN COTTON BLU DISP SURG STRL LF (DRAPE/PACKS/SHEETS/OR TOWEL) ×1 IMPLANT
WATER STRL 1000ML PLASTIC PR BTL LF (MED SURG SUPPLIES) ×1 IMPLANT

## 2023-05-11 NOTE — Anesthesia Transfer of Care (Signed)
ANESTHESIA TRANSFER OF CARE   Lindsay Crawford is a 47 y.o. ,female, Weight: 109 kg (240 lb)   had Procedure(s):  CYSTOSCOPY; LASER LITHOTRIPSY; RIGHT URETEROSCOPY; RIGHT RETROGRADE PYELOGRAM; STONE BASKETING; RIGHT STENT PLACEMENT  performed  05/11/23   Primary Service: Theodoro Kalata, DO    Past Medical History:   Diagnosis Date    Body mass index 35.0-35.9, adult     Borderline diabetes     Chronic low back pain     Dizziness     DUB (dysfunctional uterine bleeding)     Gastroparesis     GERD (gastroesophageal reflux disease)     Hyperglycemia     Hyperlipidemia LDL goal <70     Insomnia     Knee pain, bilateral     Migraine     Paraspinal muscle spasm     Vitamin D deficiency       Allergy History as of 05/11/23       OTHER         Noted Status Severity Type Reaction    02/03/22 1224 Bowling, Donnella Sham, DO 03/31/18 Active Medium  Rash    Comments: Allergic to the sun                    I completed my transfer of care / handoff to the receiving personnel during which we discussed:  Access, Airway, All key/critical aspects of case discussed, Analgesia, Antibiotics, Expectation of post procedure, Fluids/Product, Gave opportunity for questions and acknowledgement of understanding, Labs and PMHx  Report given to: Chattin, Reegan, RN                                                                 Last OR Temp: Temperature: 36.2 C (97.2 F)  ABG:  POTASSIUM   Date Value Ref Range Status   04/27/2023 3.9 3.5 - 5.1 mmol/L Final     KETONES   Date Value Ref Range Status   04/25/2023 Negative Negative mg/dL Final     CALCIUM   Date Value Ref Range Status   04/27/2023 9.3 8.6 - 10.3 mg/dL Final     CALCIUM OXALATE CRYSTALS   Date Value Ref Range Status   08/25/2022 Many (A) (none) /hpf Final     CANNAQL   Date Value Ref Range Status   04/27/2023 Negative Negative Final     BENZO QL   Date Value Ref Range Status   04/27/2023 Negative Negative Final     Airway:  EndoTracheal Tube (Active)     Blood pressure 126/75, pulse 85,  temperature 36.2 C (97.2 F), resp. rate 15, height 1.727 m (5\' 8" ), weight 109 kg (240 lb), SpO2 99%.

## 2023-05-11 NOTE — Discharge Instructions (Addendum)
Drink plenty of water  Blood in the urine is normal after this procedure  Burning with urination is normal after this procedure may use azo over-the-counter for discomfort  May use Toradol for discomfort for over-the-counter Motrin

## 2023-05-11 NOTE — OR Surgeon (Signed)
[facility]                                              OPERATIVE NOTE    Patient Name: Lindsay Crawford, Lindsay Crawford Number: [id number]  Date of Service: 05/11/2023   Date of Birth: 1976-10-30    All elements must be documented.    Pre-Operative Diagnosis:  Right distal nephrolithiasis   Post-Operative Diagnosis:  Right distal nephrolithiasis  Procedure(s)/Description:  Cystoscopy/right ureteroscopy laser lithotripsy stone basketing  Findings:  Right distal nephrolithiasis     Attending Surgeon:  Dr. Velia Meyer  Assistant(s):  None    Anesthesia Type: General  Estimated Blood Loss:  Minimal  Blood Given:  None  Fluids Given:  See anesthesia log  Complications (unintended/unexpected/iatrogenic/accidental/inadvertent events):  None visualized  Characteristic Event (routinely expected or inherent to the difficulty/nature of the procedure):  Routine  Did the use of current and/or prior Anticoagulants impact the outcome of the case? N\A  Wound Class: Clean Contaminated Wounds -Respiratory, GI, Genital, or Urinary    Tubes: None  Drains: None  Specimens/ Cultures:  Kidney stone  Implants:  None    Indications-patient was a 47 year old Caucasian female with right flank discomfort CT abdomen pelvis shows a mid to distal 5 mm nephrolithiasis.  Patient here for surgical management.    Operative findings-right distal nephrolithiasis    Operative procedure-patient was transferred to the operating room placed to continuous pulse extremity and cardiac my per anesthesia IV antibiotics including Ancef administered by anesthesia.  LMA general sedation administered by anesthesia staff patient placed in dorsal lithotomy position prepped and draped in usual sterile fashion SCDs were activated and time-out was performed a fluoroscopic KUB could not identify the nephrolithiasis possibly it was over the bony structures.  So a 21 French cystoscope placed urinary bladder no meatal stenosis or urethral stricture  patient was trigone metaplasia.  No mucosal lesions.  The right ureteral orifice was cannulated with a straight tip 035 glidewire and an 810 dilator was used to manipulate the right ureteral orifice.  A semi-rigid ureteroscope was placed into the right ureteral orifice without difficulties and the nephrolithiasis was visualized.  A 200 micron laser fiber set at 20 hertz and 0.4 joules dusted the stone the remaining fragments were extracted with a 1.9 [place] ZeroTip basket.  Secondary evaluation from distal to proximal shows no other nephrolithiasis or ureteral injury no stent will be placed to the atraumatic nature.  Patient's bladder then was evacuated using cystoscopy and remaining stone fragments.  Stone fragments were sent for chemical analysis.  Patient tolerated procedure well patient transferred to PACU once medically stable    Plan-patient will be discharged once medically stable with Toradol and Colace.  Patient will follow up for postop evaluation and review in about 3 months              Disposition: PACU - hemodynamically stable.  Condition: stable    Theodoro Kalata, DO

## 2023-05-11 NOTE — Anesthesia Postprocedure Evaluation (Signed)
Anesthesia Post Op Evaluation    Patient: Lindsay Crawford  Procedure(s):  CYSTOSCOPY; LASER LITHOTRIPSY; RIGHT URETEROSCOPY; RIGHT RETROGRADE PYELOGRAM; STONE BASKETING; RIGHT STENT PLACEMENT    Last Vitals:Temperature: 36.2 C (97.2 F) (05/11/23 0805)  Heart Rate: 84 (05/11/23 0815)  BP (Non-Invasive): 137/87 (05/11/23 0815)  Respiratory Rate: 16 (05/11/23 0815)  SpO2: 96 % (05/11/23 0815)    No notable events documented.    Patient is sufficiently recovered from the effects of anesthesia to participate in the evaluation and has returned to their pre-procedure level.  Patient location during evaluation: PACU       Patient participation: complete - patient participated  Level of consciousness: awake and alert and responsive to verbal stimuli    Pain management: adequate  Airway patency: patent    Anesthetic complications: no  Cardiovascular status: acceptable  Respiratory status: acceptable  Hydration status: acceptable  Patient post-procedure temperature: Pt Normothermic   PONV Status: Absent

## 2023-05-11 NOTE — Anesthesia Preprocedure Evaluation (Signed)
ANESTHESIA PRE-OP EVALUATION  Planned Procedure: CYSTOSCOPY; LASER LITHOTRIPSY; RIGHT URETEROSCOPY; RIGHT RETROGRADE PYELOGRAM; STONE BASKETING; RIGHT STENT PLACEMENT (Right: Ureter)  Review of Systems                   Pulmonary     Cardiovascular    Hyperlipidemia ,       GI/Hepatic/Renal    GERD        Endo/Other    obesity,      Neuro/Psych/MS    headaches     Cancer                        Physical Assessment      Airway       Mallampati: III      Mouth Opening: good.            Dental                    Pulmonary           Cardiovascular             Other findings              Plan  ASA 3     Planned anesthesia type: general                                                            Urine Pregnancy Results: Negative

## 2023-05-17 LAB — STONE ANALYSIS W/ IMAGE: STONE WEIGHT: 0.009 g

## 2023-05-26 ENCOUNTER — Telehealth (RURAL_HEALTH_CENTER): Payer: Self-pay | Admitting: Family Medicine

## 2023-05-26 MED ORDER — FAMOTIDINE 40 MG TABLET
40.0000 mg | ORAL_TABLET | Freq: Every evening | ORAL | 3 refills | Status: AC
Start: 2023-05-26 — End: 2024-06-06

## 2023-05-26 MED ORDER — CYCLOBENZAPRINE 10 MG TABLET
10.0000 mg | ORAL_TABLET | Freq: Two times a day (BID) | ORAL | 3 refills | Status: DC
Start: 2023-05-26 — End: 2024-06-07

## 2023-05-26 MED ORDER — BETHANECHOL CHLORIDE 5 MG TABLET
5.0000 mg | ORAL_TABLET | Freq: Two times a day (BID) | ORAL | 3 refills | Status: DC
Start: 2023-05-26 — End: 2023-06-20

## 2023-05-26 MED ORDER — CHOLECALCIFEROL (VITAMIN D3) 1,250 MCG (50,000 UNIT) CAPSULE
50000.0000 [IU] | ORAL_CAPSULE | ORAL | 3 refills | Status: AC
Start: 2023-05-26 — End: 2024-05-25

## 2023-06-14 ENCOUNTER — Encounter (RURAL_HEALTH_CENTER): Payer: Self-pay | Admitting: Family Medicine

## 2023-06-14 ENCOUNTER — Other Ambulatory Visit: Payer: Self-pay

## 2023-06-14 ENCOUNTER — Ambulatory Visit (RURAL_HEALTH_CENTER): Payer: MEDICAID | Attending: Family Medicine | Admitting: Family Medicine

## 2023-06-14 VITALS — BP 134/60 | HR 92 | Temp 100.2°F | Resp 18 | Ht 68.0 in | Wt 243.0 lb

## 2023-06-14 DIAGNOSIS — L03031 Cellulitis of right toe: Secondary | ICD-10-CM | POA: Insufficient documentation

## 2023-06-14 DIAGNOSIS — S90451A Superficial foreign body, right great toe, initial encounter: Secondary | ICD-10-CM | POA: Insufficient documentation

## 2023-06-14 MED ORDER — FLUCONAZOLE 150 MG TABLET
150.0000 mg | ORAL_TABLET | Freq: Once | ORAL | 0 refills | Status: DC
Start: 2023-06-14 — End: 2023-07-12

## 2023-06-14 MED ORDER — SULFAMETHOXAZOLE 800 MG-TRIMETHOPRIM 160 MG TABLET
1.0000 | ORAL_TABLET | Freq: Two times a day (BID) | ORAL | 0 refills | Status: DC
Start: 2023-06-14 — End: 2023-07-12

## 2023-06-14 NOTE — Progress Notes (Signed)
FAMILY MEDICINE, [facility]  [address]  [place] [address]  Operated by [facility]  Progress Note    Name: Lindsay Crawford MRN:  [id number]   Date: 06/14/2023 DOB:  [birth year] (46 y.o.)             Chief Complaint: Splinter Removal (Splinter in right great toe since Saturday/)  Subjective   Subjective:   Patient presents with splinter on R phallux that happened on Friday. She has put peroxide and neosporin and has kept it clean. Pt reports no subjective fever but has a temp of 100.28F, no headache. She has used some cream OTC which has decreased the redness and pulled three splinter pieces off. She notes that she hit some plywood at home which caused the splinter.     Objective   Objective :  BP 134/60   Pulse 92   Temp 37.9 C (100.2 F)   Resp 18   Ht 1.727 m (5\' 8" )   Wt 110 kg (243 lb)   SpO2 95%   BMI 36.95 kg/m   RRR, no murmurs or rubs, lungs CTA bilaterally without wheezes or crackles; pulses +2/4 in bilateral LE, R phallux with significant erythema at DIP, no deformity but slight edema. Open wound about 0.25 cm medial aspect great right toe with small scab    Data reviewed:    Current Outpatient Medications   Medication Sig    albuterol sulfate (PROVENTIL OR VENTOLIN OR PROAIR) 90 mcg/actuation Inhalation oral inhaler Take 2 Puffs by inhalation Every 6 hours as needed    bethanechol chloride (URECHOLINE) 5 mg Oral Tablet Take 1 Tablet (5 mg total) by mouth Twice daily    Biotin 10 mg Oral Tablet Take 1 Tablet (10 mg total) by mouth Once a day    cetirizine (ZYRTEC) 10 mg Oral Tablet Take 1 Tablet (10 mg total) by mouth Once a day    cholecalciferol, vitamin D3, 1,250 mcg (50,000 unit) Oral Capsule Take 1 Capsule (50,000 Units total) by mouth Every 7 days    cyanocobalamin (VITAMIN B 12) 1,000 mcg Oral Tablet Take 1 Tablet (1,000 mcg total) by mouth Once a day    cyclobenzaprine (FLEXERIL) 10 mg Oral Tablet Take 1 Tablet (10 mg total) by  mouth Twice daily    famotidine (PEPCID) 40 mg Oral Tablet Take 1 Tablet (40 mg total) by mouth Every evening    fluconazole (DIFLUCAN) 150 mg Oral Tablet Take 1 Tablet (150 mg total) by mouth One time for 1 dose Indications: a yeast infection of the vagina and vulva    gabapentin (NEURONTIN) 300 mg Oral Capsule Take 1 Capsule (300 mg total) by mouth Three times a day for 180 days Indications: 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 CALL 911. REPEAT AFTER 3 MINUTES IF NO RESPONSE.    omeprazole (PRILOSEC) 40 mg Oral Capsule, Delayed Release(E.C.) Take 1 Capsule (40 mg total) by mouth Twice daily    ondansetron (ZOFRAN ODT) 4  mg Oral Tablet, Rapid Dissolve Take 1 Tablet (4 mg total) by mouth Every 8 hours as needed for Nausea/Vomiting    QUEtiapine (SEROQUEL) 100 mg Oral Tablet Take 1 Tablet (100 mg total) by mouth Twice daily    rosuvastatin (CRESTOR) 5 mg Oral Tablet Take 1 Tablet (5 mg total) by mouth Once a day    trimethoprim-sulfamethoxazole (BACTRIM DS) 160-800mg  per tablet Take 1 Tablet (160 mg total) by mouth Twice daily        Assessment/Plan  Problem List Items Addressed This Visit    None  Visit Diagnoses       Cellulitis of great toe, right    -  Primary    Relevant Orders    XR TOE, 1ST DIGIT RIGHT    Foreign body of skin of right great toe        Relevant Orders    XR TOE, 1ST DIGIT RIGHT        Prescribed bactrim. Use soaks twice per day. If fever persists after two days of antibiotic or redness worsens, RTC.  We can f/u in one week if not significantly improved to try to explore for  foreign body removal or can refer to podiatry. Can get xray at her convenience      Lois Huxley, MED STUDENT

## 2023-06-20 ENCOUNTER — Other Ambulatory Visit (RURAL_HEALTH_CENTER): Payer: Self-pay | Admitting: Family Medicine

## 2023-06-20 ENCOUNTER — Inpatient Hospital Stay
Admission: RE | Admit: 2023-06-20 | Discharge: 2023-06-20 | Disposition: A | Discharge status: Home / Self Care | Payer: MEDICAID | Source: Ambulatory Visit | Attending: Family Medicine | Admitting: Family Medicine

## 2023-06-20 ENCOUNTER — Other Ambulatory Visit: Payer: Self-pay

## 2023-06-20 ENCOUNTER — Ambulatory Visit (RURAL_HEALTH_CENTER): Payer: MEDICAID | Attending: Family Medicine | Admitting: Family Medicine

## 2023-06-20 ENCOUNTER — Encounter (RURAL_HEALTH_CENTER): Payer: Self-pay | Admitting: Family Medicine

## 2023-06-20 VITALS — BP 130/80 | HR 86 | Temp 99.7°F | Ht 68.0 in | Wt 244.0 lb

## 2023-06-20 DIAGNOSIS — L03031 Cellulitis of right toe: Secondary | ICD-10-CM | POA: Insufficient documentation

## 2023-06-20 DIAGNOSIS — S90451A Superficial foreign body, right great toe, initial encounter: Secondary | ICD-10-CM | POA: Insufficient documentation

## 2023-06-20 DIAGNOSIS — M5126 Other intervertebral disc displacement, lumbar region: Secondary | ICD-10-CM | POA: Insufficient documentation

## 2023-06-20 DIAGNOSIS — K3184 Gastroparesis: Secondary | ICD-10-CM | POA: Insufficient documentation

## 2023-06-20 MED ORDER — GABAPENTIN 600 MG TABLET
600.0000 mg | ORAL_TABLET | Freq: Three times a day (TID) | ORAL | 5 refills | Status: DC
Start: 2023-06-20 — End: 2023-07-12

## 2023-06-20 MED ORDER — BETHANECHOL CHLORIDE 10 MG TABLET
10.0000 mg | ORAL_TABLET | Freq: Two times a day (BID) | ORAL | 1 refills | Status: DC
Start: 2023-06-20 — End: 2023-12-28

## 2023-06-20 NOTE — Progress Notes (Signed)
FAMILY MEDICINE, [facility] FAMILY MEDICINE [facility]  [address]  [place] [address]  Operated by [facility]     Name: Lindsay Crawford MRN:  [id number]   Date of Birth: 23-Oct-1976 Age: 47 y.o.   Date: 06/20/2023  Time: 14:29     Provider: Weyman Pedro, DO    Assessment/Plan:    1. Cellulitis of great toe, right  Notified by radiology regarding possibility of osteomyelitis of foot. Will get blood cultures/MRI/refer surgery  - XR FOOT RIGHT; Future  - XR TOE, 1ST DIGIT RIGHT; Future      FINDINGS:   No visible fracture. There is suggestion of some mild periosteal reaction at the medial base of the first distal phalanx. This is partially obscured by a metallic marker  Normal alignment.  There is some swelling around the foot and ankle. There is some mild calcaneal spurring  A metallic marker projects over the medial aspect of the first IP joint. No radiopaque foreign bodies identified in this region     IMPRESSION:  Suspicious for osteomyelitis at the medial base of the first distal phalanx        2. Foreign body of skin of right great toe  Has had multiple pieces of wood over the weekend of soaking the foot. Fever improved after antibx therapy but redness is still present. Stop bactrim. Rx doxycycline and Augmentin. Will get further testing and refer to surgery as may require debridement.  - XR FOOT RIGHT; Future  - XR TOE, 1ST DIGIT RIGHT; Future    3. Gastroparesis  S/p nissen fundoplication in January 2024. Had recent gastric emptying scan showing 88% retained food at 1 hour, 73% at 2 hrs, and 42% AT 4 HOURS. Following with GI in [facility] and they are setting her up for repeat EGD  Increase bethanechol to 10 mg po bid from 5mg  po bid.  F/u with GI         4. 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  Orders Placed This Encounter    XR FOOT RIGHT    XR TOE, 1ST  DIGIT RIGHT    bethanechol chloride (URECHOLINE) 10 mg Oral Tablet        Discontinued Medications    BETHANECHOL CHLORIDE (URECHOLINE) 5 MG ORAL TABLET    Take 1 Tablet (5 mg total) by mouth Twice daily      Reason for visit: Follow Up (Follow up toe is better she is still soaking and has gotten 2 more pieces out)      History of Present Illness:  Lindsay Crawford is a 47 y.o. female presenting in f/u of cellulitis in her left toe. She hit her foot on a board on 7/14 getting a splinter inside. On 7/16, pt presented to the office and was started on bactrim. She has another few days of antibiotic and notes that the toe is improving but still red. She has had several more pieces of wood that extruded with further soaking of her foot.. Fever has improved since antibiotic.    Pt with chronic low back pain; taking gabapentin 300 mg tid without adverse effects but feels like increasing the dose may offer more relief.    Had Upper GI and gastric emptying scan which were abnormal with GI. Significant delayed gastric emptying. Taking reglan from GI as  well as bethanechol without relief. Has dyspepsia and bloating.  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    Knee pain, right    Obesity (BMI 35.0-39.9 without comorbidity)    Borderline diabetes    Irritable bowel syndrome with constipation    DUB (dysfunctional uterine bleeding)    Gastroparesis    Hyperlipidemia LDL goal <70    Insomnia    Vitamin D deficiency    Paraspinal muscle spasm    Migraine    GERD (gastroesophageal reflux disease)    Pelvic pain    Menorrhagia with irregular cycle    S/P Nissen fundoplication (without gastrostomy tube) procedure    Vaginal discharge    Fibroids, intramural    Menopausal symptoms    Hair loss    Irregular periods/menstrual cycles    Kidney stone        Historical Data    Past Medical History:  Past Medical History:   Diagnosis Date    Body mass index 35.0-35.9, adult     Borderline  diabetes     Chronic low back pain     Dizziness     DUB (dysfunctional uterine bleeding)     Gastroparesis     GERD (gastroesophageal reflux disease)     Hyperglycemia     Hyperlipidemia LDL goal <70     Insomnia     Knee pain, bilateral     Migraine     Paraspinal muscle spasm     Vitamin D deficiency          Past Surgical History:  Past Surgical History:   Procedure Laterality Date    CESAREAN SECTION  2004    COLONOSCOPY      Dr. Lequita Halt in [place]    ENDOMETRIAL ABLATION W/ NOVASURE      dr brodnik 12/14/22    ESOPHAGOSCOPY / EGD      HX APPENDECTOMY      HX BACK SURGERY      HX LAP CHOLECYSTECTOMY      LAPAROSCOPIC TUBAL LIGATION  07/30/2022    with D&C brodmik    NISSEN FUNDOPLICATION  08/13/2022    had in [place]         Allergies:  Allergies   Allergen Reactions    Other Rash     Allergic to the sun      Medications:  albuterol sulfate (PROVENTIL OR VENTOLIN OR PROAIR) 90 mcg/actuation Inhalation oral inhaler, Take 2 Puffs by inhalation Every 6 hours as needed  Biotin 10 mg Oral Tablet, Take 1 Tablet (10 mg total) by mouth Once a day  cetirizine (ZYRTEC) 10 mg Oral Tablet, Take 1 Tablet (10 mg total) by mouth Once a day  cholecalciferol, vitamin D3, 1,250 mcg (50,000 unit) Oral Capsule, Take 1 Capsule (50,000 Units total) by mouth Every 7 days  cyanocobalamin (VITAMIN B 12) 1,000 mcg Oral Tablet, Take 1 Tablet (1,000 mcg total) by mouth Once a day  cyclobenzaprine (FLEXERIL) 10 mg Oral Tablet, Take 1 Tablet (10 mg total) by mouth Twice daily  famotidine (PEPCID) 40 mg Oral Tablet, Take 1 Tablet (40 mg total) by mouth Every evening  gabapentin (NEURONTIN) 300 mg Oral Capsule, Take 1 Capsule (300 mg total) by mouth Three times a day for 180 days Indications: nerve pain after herpes  linaCLOtide (LINZESS) 145 mcg Oral Capsule, Take 1 Capsule (145 mcg total) by mouth Every morning  magnesium chloride (SLOW-MAG) 64 mg Oral  Tablet, Delayed Release (E.C.), Take 1 Tablet (64 mg total) by mouth Once a  day  metoclopramide HCl (REGLAN) 5 mg Oral Tablet, Take 1 Tablet (5 mg total) by mouth Once a day  naloxone (NARCAN) 4 mg per spray nasal spray, ADMINISTER A SINGLE SPRAY IN ONE NOSTRIL UPON SIGNS OF OPIOID OVERDOSE. CALL 911. REPEAT AFTER 3 MINUTES IF NO RESPONSE.  omeprazole (PRILOSEC) 40 mg Oral Capsule, Delayed Release(E.C.), Take 1 Capsule (40 mg total) by mouth Twice daily  ondansetron (ZOFRAN ODT) 4 mg Oral Tablet, Rapid Dissolve, Take 1 Tablet (4 mg total) by mouth Every 8 hours as needed for Nausea/Vomiting  QUEtiapine (SEROQUEL) 100 mg Oral Tablet, Take 1 Tablet (100 mg total) by mouth Twice daily  rosuvastatin (CRESTOR) 5 mg Oral Tablet, Take 1 Tablet (5 mg total) by mouth Once a day  trimethoprim-sulfamethoxazole (BACTRIM DS) 160-800mg  per tablet, Take 1 Tablet (160 mg total) by mouth Twice daily  bethanechol chloride (URECHOLINE) 5 mg Oral Tablet, Take 1 Tablet (5 mg total) by mouth Twice daily    No facility-administered medications prior to visit.     Family History:  Family Medical History:       Problem Relation (Age of Onset)    COPD Father    Coronary Artery Disease Brother    Diabetes type II Father    Hypertension (High Blood Pressure) Mother, Father    Kidney Disease Father    No Known Problems Half-Sister, Half-Brother, Maternal Aunt, Maternal Uncle, Paternal Aunt, Paternal Uncle, Maternal Grandmother, Maternal Grandfather, Paternal Grandmother, Paternal Grandfather, Daughter, Son    Throat cancer Sister            Social History:  Social History     Socioeconomic History    Marital status: Widowed    Number of children: 1    Years of education: 12+    Highest education level: Associate degree: occupational, Scientist, product/process development, or vocational program   Tobacco Use    Smoking status: Never    Smokeless tobacco: Never   Vaping Use    Vaping status: Never Used   Substance and Sexual Activity    Alcohol use: Never    Drug use: Never    Sexual activity: Yes     Partners: Male     Birth control/protection:  None     Social Determinants of Health     Financial Resource Strain: Low Risk  (12/29/2022)    Financial Resource Strain     SDOH Financial: No   Transportation Needs: Low Risk  (12/29/2022)    Transportation Needs     SDOH Transportation: No   Social Connections: Low Risk  (12/29/2022)    Social Connections     SDOH Social Isolation: 5 or more times a week   Intimate Partner Violence: Low Risk  (12/29/2022)    Intimate Partner Violence     SDOH Domestic Violence: No   Housing Stability: Low Risk  (12/29/2022)    Housing Stability     SDOH Housing Situation: I have housing.     SDOH Housing Worry: No           Review of Systems:  Any pertinent Review of Systems as addressed in the HPI above.    Physical Exam:  Vital Signs:  Vitals:    06/20/23 1412   BP: 130/80   Pulse: 86   Temp: 37.6 C (99.7 F)   TempSrc: Tympanic   SpO2: 94%   Weight: 111 kg (244 lb)   Height:  1.727 m (5\' 8" )   BMI: 37.18     Physical Exam  Vitals reviewed.   Constitutional:       Appearance: Normal appearance. She is not toxic-appearing.   HENT:      Head: Normocephalic and atraumatic.   Neck:      Vascular: No carotid bruit.   Cardiovascular:      Rate and Rhythm: Normal rate and regular rhythm.      Pulses: Normal pulses.      Heart sounds: Normal heart sounds.   Pulmonary:      Effort: Pulmonary effort is normal.      Breath sounds: Normal breath sounds.   Musculoskeletal:         General: Swelling and tenderness present.      Cervical back: Neck supple.   Lymphadenopathy:      Cervical: No cervical adenopathy.   Skin:     Comments: Erythema present left great toe to 1st MTP joint.  This is a little decreased from previous visit but not as improved as I would have hoped after nearly a week of antibiotic. I am concerned more pieces of wood remain.   Neurological:      Mental Status: She is alert.                Weyman Pedro, DO     Portions of this note may be dictated using voice recognition software or a dictation service. Variances in  spelling and vocabulary are possible and unintentional. Not all errors are caught/corrected. Please notify the Thereasa Parkin if any discrepancies are noted or if the meaning of any statement is not clear.

## 2023-06-21 ENCOUNTER — Telehealth (RURAL_HEALTH_CENTER): Payer: Self-pay | Admitting: Family Medicine

## 2023-06-21 ENCOUNTER — Other Ambulatory Visit (RURAL_HEALTH_CENTER): Payer: Self-pay | Admitting: Family Medicine

## 2023-06-21 DIAGNOSIS — L03031 Cellulitis of right toe: Secondary | ICD-10-CM

## 2023-06-21 DIAGNOSIS — S90451A Superficial foreign body, right great toe, initial encounter: Secondary | ICD-10-CM

## 2023-06-21 MED ORDER — DOXYCYCLINE HYCLATE 100 MG TABLET
100.0000 mg | ORAL_TABLET | Freq: Two times a day (BID) | ORAL | 0 refills | Status: AC
Start: 2023-06-21 — End: 2023-07-01

## 2023-06-21 MED ORDER — AMOXICILLIN 875 MG-POTASSIUM CLAVULANATE 125 MG TABLET
1.0000 | ORAL_TABLET | Freq: Two times a day (BID) | ORAL | 0 refills | Status: AC
Start: 2023-06-21 — End: 2023-07-01

## 2023-06-21 NOTE — Telephone Encounter (Signed)
Talked with patient regarding MRI ordered after x-ray results.  Confirmed with patient that it was the right foot.  She asked if new medications were sent in for her.  Told her two new antibiotics were sent in.  She asked if she should continue the Bactrim.  Per Dr. Suzette Battiest instructed patient she did not need to continue the Bactrim.

## 2023-06-28 IMAGING — MR MRI FOOT RT WO CONTRAST
4 of 6 series · 19 of 40 positions shown · IV contrast (gadolinium)
Comparison: None previous available.

﻿EXAM:  [id number]   MRI FOOT RT WO CONTRAST
INDICATION: 46-year-old with right forefoot pain in the toe.  History of infection of the toe. Rule out osteomyelitis.  No prior surgery.
TECHNIQUE: Multiplanar, multisequential MRI of the right foot was performed without gadolinium contrast.

[Series 5: T1 · sagittal · right · 3.5mm · 0.55mm/px · 6 of 22 slices shown (1 of 3)]
[im 1/22]
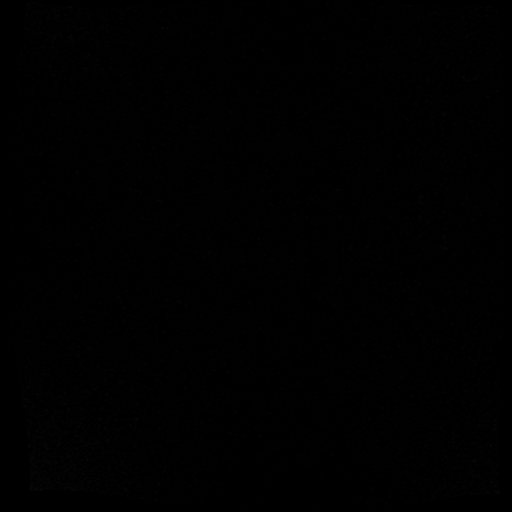
[im 5/22]
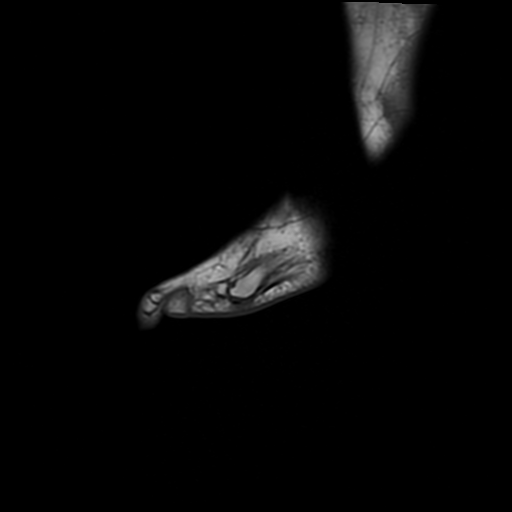
[im 9/22]
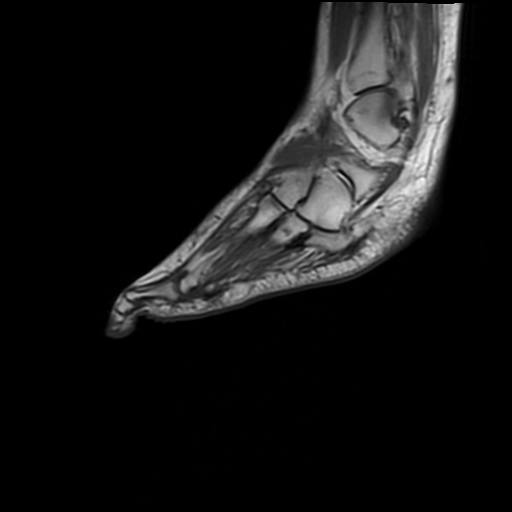
[im 13/22]
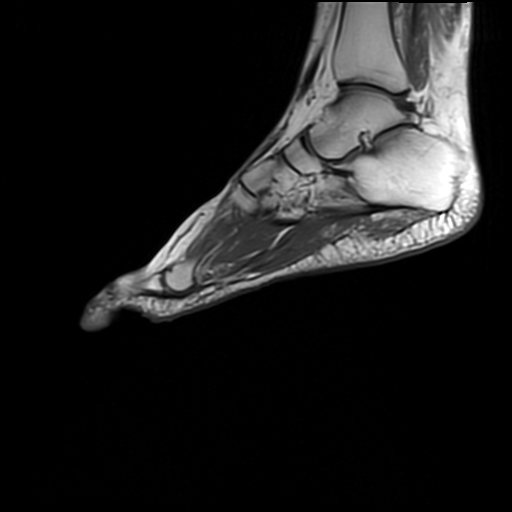
[im 17/22]
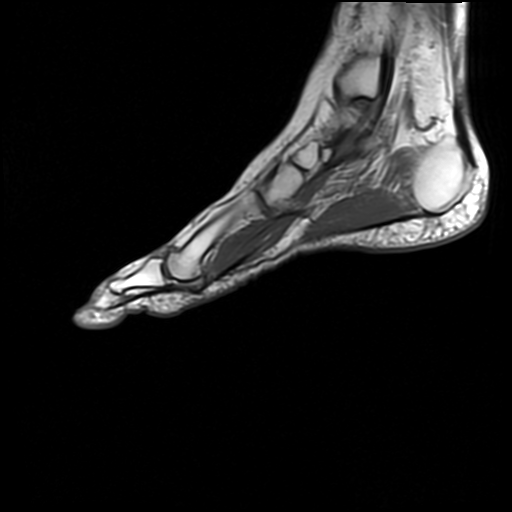
[im 22/22]
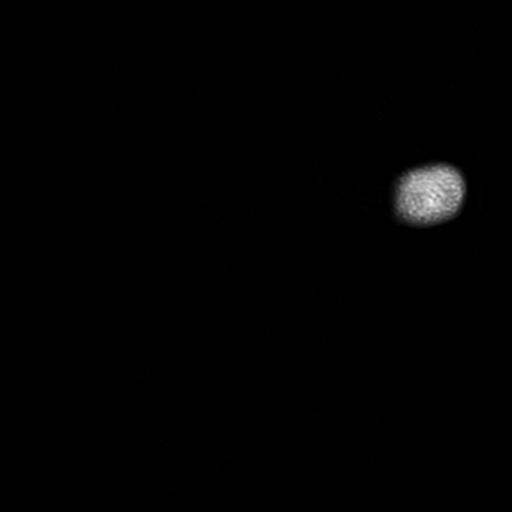

[Series 7: T1 · coronal · right · 5.0mm · 0.29mm/px · 4 of 28 slices shown (2 of 3)]
[im 1/28]
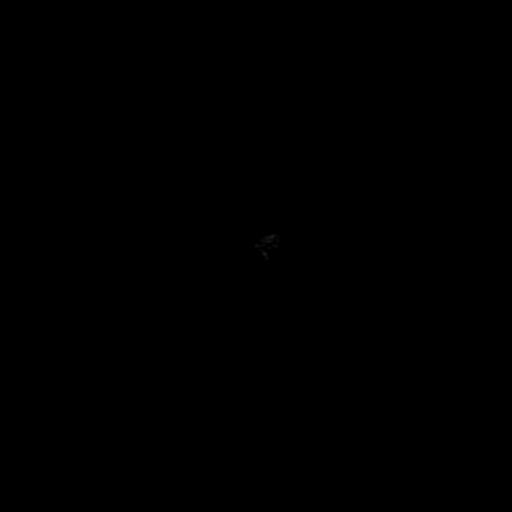
[im 4/28]
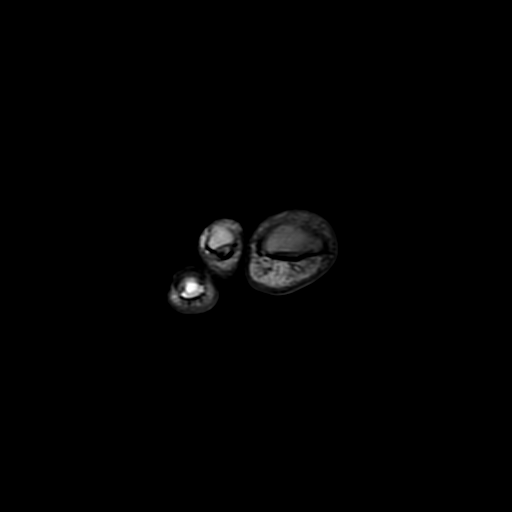
[im 16/28]
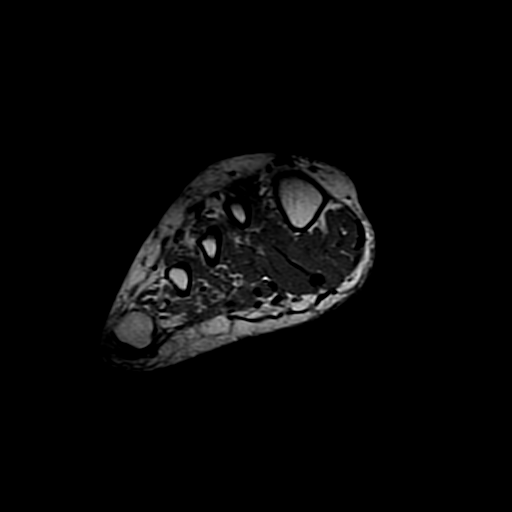
[im 24/28]
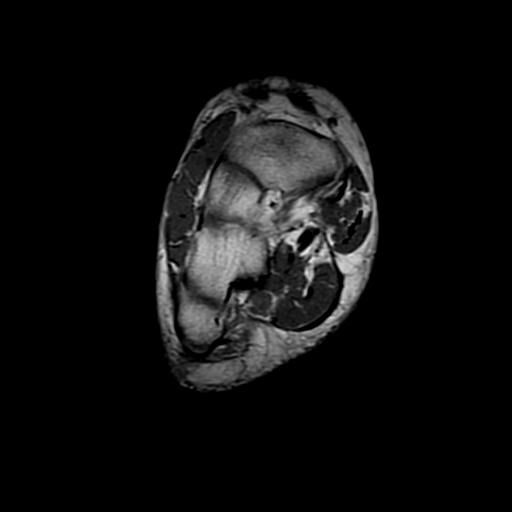

[Series 8: T1 · axial · right · 3.5mm · 0.55mm/px · z∈[-114,-46]mm · 3 of 22 slices shown (3 of 3)]
[im 5/22]
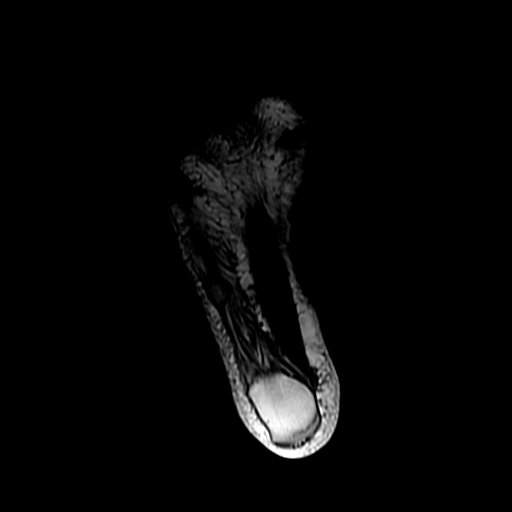
[im 13/22]
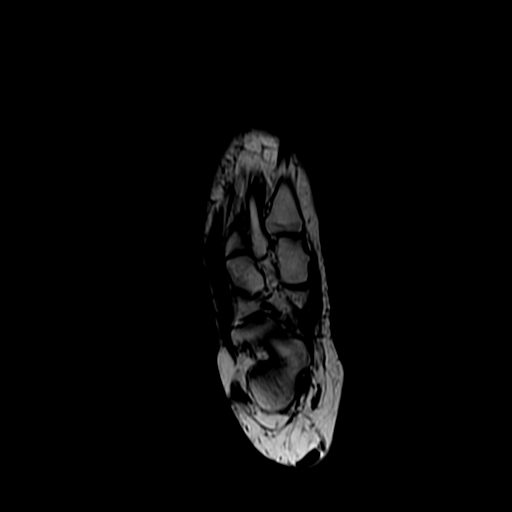
[im 22/22]
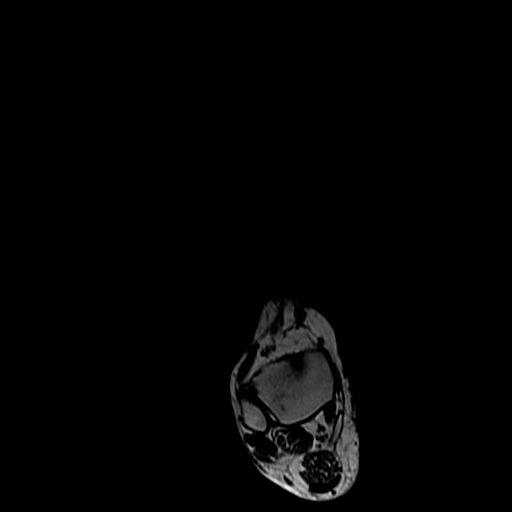

[Series 9: T2 fat-sat · axial · right · 3.5mm · 0.55mm/px · z∈[-130,-46]mm · 6 of 22 slices shown]
[im 1/22]
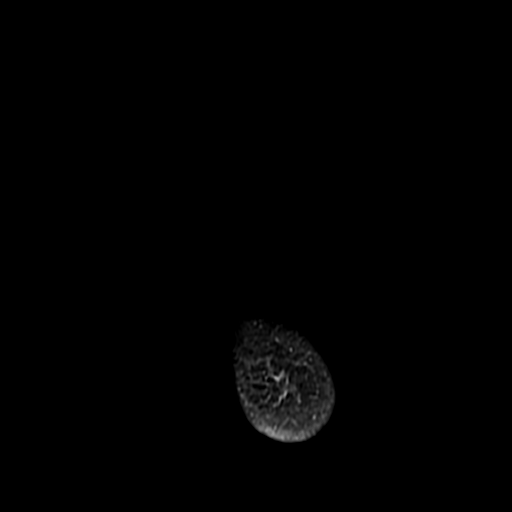
[im 5/22]
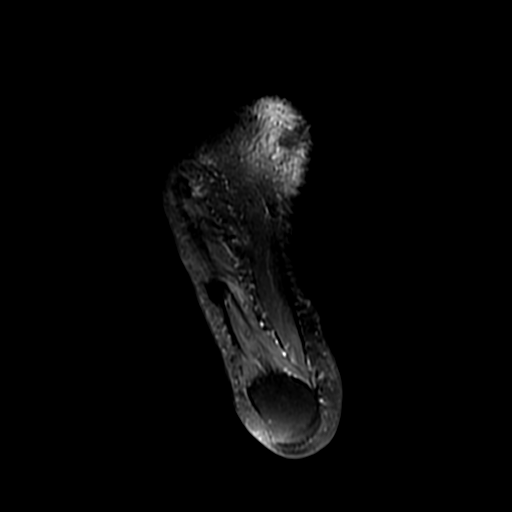
[im 9/22]
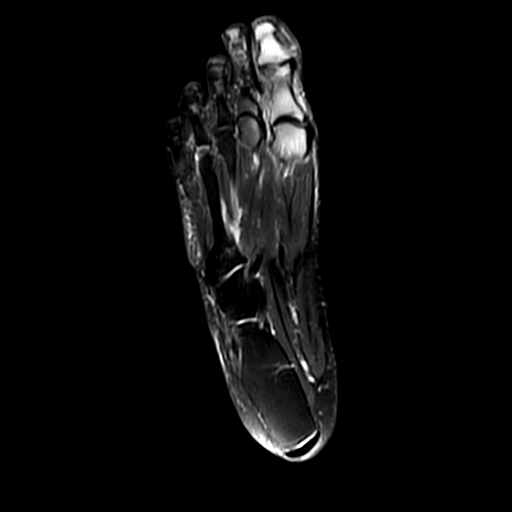
[im 13/22]
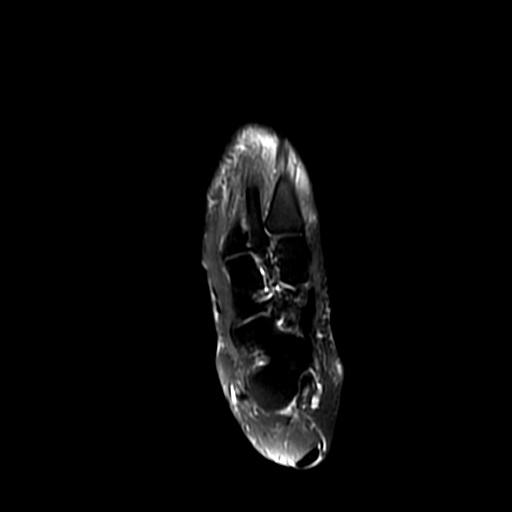
[im 17/22]
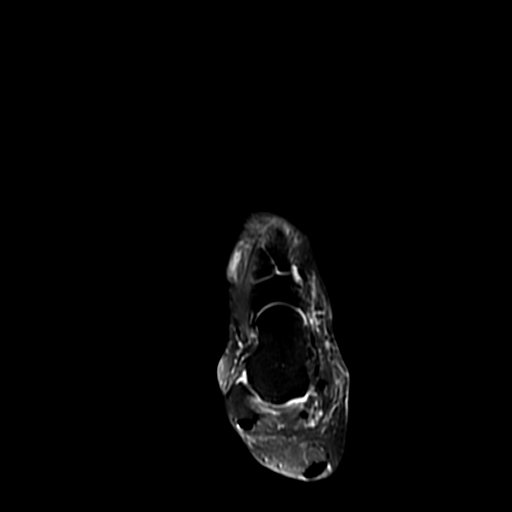
[im 22/22]
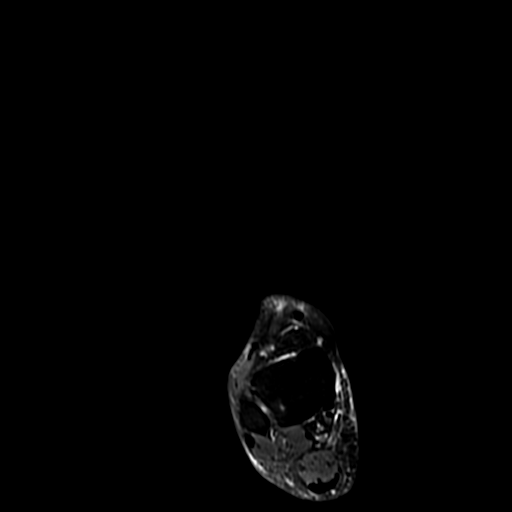

[19 of 40 positions shown; findings below may reference images not displayed]

FINDINGS: Cellulitis of the distal half of the big toe is noted.  Increased T2 signal within the distal phalanx and poor definition of the base of the distal phalanx of the big toe are suggestive of osteomyelitis involving the distal phalanx.  Remaining toes are unremarkable.  Mild edema of the dorsal aspect of the foot.  No abnormalities in the hindfoot on sagittal images.
IMPRESSION: 1. Soft tissue inflammation and cellulitis of distal half of the right big toe.

2. Poor definition of the margins of the distal phalanx and increased T2 signal are suggestive of osteomyelitis involving the distal phalanx.

## 2023-06-30 ENCOUNTER — Encounter (INDEPENDENT_AMBULATORY_CARE_PROVIDER_SITE_OTHER): Payer: Self-pay | Admitting: Surgery

## 2023-06-30 ENCOUNTER — Ambulatory Visit (INDEPENDENT_AMBULATORY_CARE_PROVIDER_SITE_OTHER): Payer: MEDICAID | Admitting: Surgery

## 2023-06-30 ENCOUNTER — Other Ambulatory Visit: Payer: Self-pay

## 2023-06-30 VITALS — BP 141/85 | HR 86 | Temp 96.7°F | Ht 68.0 in | Wt 248.0 lb

## 2023-06-30 DIAGNOSIS — E669 Obesity, unspecified: Secondary | ICD-10-CM

## 2023-06-30 DIAGNOSIS — S90451A Superficial foreign body, right great toe, initial encounter: Secondary | ICD-10-CM

## 2023-06-30 DIAGNOSIS — L03031 Cellulitis of right toe: Secondary | ICD-10-CM

## 2023-06-30 DIAGNOSIS — L03032 Cellulitis of left toe: Secondary | ICD-10-CM

## 2023-07-04 ENCOUNTER — Encounter (INDEPENDENT_AMBULATORY_CARE_PROVIDER_SITE_OTHER): Payer: Self-pay | Admitting: Surgery

## 2023-07-04 NOTE — H&P (Signed)
Office History and Physical      Reason for Visit: Osteomyelitis (Right great toe)    History of Present Illness  Lindsay Crawford presents as a referral by Dr. Suzette Battiest for evaluation of right great toe cellulitis with possible osteomyelitis secondary to a retained foreign body.  By description developed infection in her toe after a foreign body which was removed by the patient and her family.  Has been on antibiotics including doxycycline and Augmentin.  Improving.  X-ray of the foot was performed with findings suspicious for osteomyelitis in the medial base of the 1st phalanx.  No associated ulcer.  The area of injury was not on the plantar aspect.  No history of diabetes      I have reviewed the patient's provided medical records and diagnostic testing including laboratory values, imaging results, documented encounters and providers notes with all pertinent information noted with respect to today's evaluation serving as unique tests and sources as a component of the medical decision making process for this encounter relevant to the patients independent evaluation by me today.        Patient Data  Patient History  Past Medical History:   Diagnosis Date    Body mass index 35.0-35.9, adult     Borderline diabetes     Chronic low back pain     Dizziness     DUB (dysfunctional uterine bleeding)     Gastroparesis     GERD (gastroesophageal reflux disease)     Hyperglycemia     Hyperlipidemia LDL goal <70     Insomnia     Knee pain, bilateral     Migraine     Paraspinal muscle spasm     Vitamin D deficiency          Past Surgical History:   Procedure Laterality Date    CESAREAN SECTION  2004    COLONOSCOPY      Dr. Lequita Halt in [place]    ENDOMETRIAL ABLATION W/ NOVASURE      dr brodnik 12/14/22    ESOPHAGOSCOPY / EGD      HX APPENDECTOMY      HX BACK SURGERY      HX LAP CHOLECYSTECTOMY      LAPAROSCOPIC TUBAL LIGATION  07/30/2022    with D&C brodmik    NISSEN FUNDOPLICATION  08/13/2022    had in [place]         Current  Outpatient Medications   Medication Sig    albuterol sulfate (PROVENTIL OR VENTOLIN OR PROAIR) 90 mcg/actuation Inhalation oral inhaler Take 2 Puffs by inhalation Every 6 hours as needed    bethanechol chloride (URECHOLINE) 10 mg Oral Tablet Take 1 Tablet (10 mg total) by mouth Twice daily    Biotin 10 mg Oral Tablet Take 1 Tablet (10 mg total) by mouth Once a day    cetirizine (ZYRTEC) 10 mg Oral Tablet Take 1 Tablet (10 mg total) by mouth Once a day    cholecalciferol, vitamin D3, 1,250 mcg (50,000 unit) Oral Capsule Take 1 Capsule (50,000 Units total) by mouth Every 7 days    cyanocobalamin (VITAMIN B 12) 1,000 mcg Oral Tablet Take 1 Tablet (1,000 mcg total) by mouth Once a day    cyclobenzaprine (FLEXERIL) 10 mg Oral Tablet Take 1 Tablet (10 mg total) by mouth Twice daily    famotidine (PEPCID) 40 mg Oral Tablet Take 1 Tablet (40 mg total) by mouth Every evening    gabapentin (NEURONTIN) 600 mg Oral Tablet Take 1  Tablet (600 mg total) by mouth Three times a day    linaCLOtide (LINZESS) 145 mcg Oral Capsule Take 1 Capsule (145 mcg total) by mouth Every morning    magnesium chloride (SLOW-MAG) 64 mg Oral Tablet, Delayed Release (E.C.) Take 1 Tablet (64 mg total) by mouth Once a day    metoclopramide HCl (REGLAN) 5 mg Oral Tablet Take 1 Tablet (5 mg total) by mouth Once a day    naloxone (NARCAN) 4 mg per spray nasal spray ADMINISTER A SINGLE SPRAY IN ONE NOSTRIL UPON SIGNS OF OPIOID OVERDOSE. CALL 911. REPEAT AFTER 3 MINUTES IF NO RESPONSE.    omeprazole (PRILOSEC) 40 mg Oral Capsule, Delayed Release(E.C.) Take 1 Capsule (40 mg total) by mouth Twice daily    ondansetron (ZOFRAN ODT) 4 mg Oral Tablet, Rapid Dissolve Take 1 Tablet (4 mg total) by mouth Every 8 hours as needed for Nausea/Vomiting    QUEtiapine (SEROQUEL) 100 mg Oral Tablet Take 1 Tablet (100 mg total) by mouth Twice daily    rosuvastatin (CRESTOR) 5 mg Oral Tablet Take 1 Tablet (5 mg total) by mouth Once a day    trimethoprim-sulfamethoxazole  (BACTRIM DS) 160-800mg  per tablet Take 1 Tablet (160 mg total) by mouth Twice daily (Patient not taking: Reported on 06/30/2023)     Allergies   Allergen Reactions    Other Rash     Allergic to the sun      Family Medical History:       Problem Relation (Age of Onset)    COPD Father    Coronary Artery Disease Brother    Diabetes type II Father    Hypertension (High Blood Pressure) Mother, Father    Kidney Disease Father    No Known Problems Half-Sister, Half-Brother, Maternal Aunt, Maternal Uncle, Paternal Aunt, Paternal Uncle, Maternal Grandmother, Maternal Grandfather, Paternal Grandmother, Paternal Grandfather, Daughter, Son    Throat cancer Sister            Social History     Tobacco Use    Smoking status: Never    Smokeless tobacco: Never   Vaping Use    Vaping status: Never Used   Substance Use Topics    Alcohol use: Never    Drug use: Never        The above documented section regarding past medical, past surgical, family, and social history (PMFSH) has been reviewed and considered and to the best of my knowledge represents a valid and accurate reflection of the patient's previous pertinent experiences documented by multiple providers and participants of the EMR.I cannot attest to all entries but do no recognize any gross inaccuracies as the data is a common field across all providers  Further history pertinent to the current encounter will be found as referenced       Physical Examination:    Vitals:    06/30/23 0947   BP: (!) 141/85   Pulse: 86   Temp: (!) 35.9 C (96.7 F)   SpO2: 96%   Weight: 112 kg (248 lb)   Height: 1.727 m (5\' 8" )   BMI: 37.79          General:appropriate for age. in no acute distress.  HEENT:Atraumatic, Normocephalic.   Lungs:Nonlabored breathing with symmetric expansion  Heart:Regular wth respect to rate and rythmn.    Abdomen:Soft. Nontender. Nondistended  Bowel sounds are present. No peritoneal signs  Skin:  The right great toe reveals what appears to be a resolving cellulitis with  no active ulcer or drainage.  No significant erythema.  Psychiatric:Alert and oriented to person, place, and time. affect appropriate        Diagnosis:    ICD-10-CM    1. Cellulitis of toe of left foot  L03.032       2. Cellulitis of great toe, right  L03.031       3. Foreign body of skin of right great toe  S90.451A       4. Obesity (BMI 35.0-39.9 without comorbidity)  E66.9           Plan:    Her clinical findings are not consistent with osteomyelitis.  Complete course of antibiotics.  Discussed local wound care.  Continue observation for now.  Follow up in 2 weeks or sooner if needed.  No need for surgical intervention      This note may have been partially generated using MModal Fluency Direct system, and there may be some incorrect words, spellings, and punctuation that were not noted in checking the note before saving, though effort was made to avoid such errors.    Clide Dales MD FACS RVT  [facility] -General Surgery

## 2023-07-12 ENCOUNTER — Other Ambulatory Visit: Payer: Self-pay

## 2023-07-12 ENCOUNTER — Encounter (RURAL_HEALTH_CENTER): Payer: Self-pay | Admitting: Family Medicine

## 2023-07-12 ENCOUNTER — Ambulatory Visit: Payer: MEDICAID | Attending: Family Medicine | Admitting: Family Medicine

## 2023-07-12 ENCOUNTER — Ambulatory Visit (RURAL_HEALTH_CENTER): Payer: MEDICAID | Attending: Family Medicine | Admitting: Family Medicine

## 2023-07-12 VITALS — BP 138/88 | HR 94 | Temp 100.2°F | Ht 68.0 in | Wt 251.0 lb

## 2023-07-12 DIAGNOSIS — L03031 Cellulitis of right toe: Secondary | ICD-10-CM | POA: Insufficient documentation

## 2023-07-12 DIAGNOSIS — R233 Spontaneous ecchymoses: Secondary | ICD-10-CM | POA: Insufficient documentation

## 2023-07-12 DIAGNOSIS — D509 Iron deficiency anemia, unspecified: Secondary | ICD-10-CM | POA: Insufficient documentation

## 2023-07-12 DIAGNOSIS — R21 Rash and other nonspecific skin eruption: Secondary | ICD-10-CM | POA: Insufficient documentation

## 2023-07-12 DIAGNOSIS — J020 Streptococcal pharyngitis: Secondary | ICD-10-CM | POA: Insufficient documentation

## 2023-07-12 DIAGNOSIS — M5416 Radiculopathy, lumbar region: Secondary | ICD-10-CM | POA: Insufficient documentation

## 2023-07-12 DIAGNOSIS — M549 Dorsalgia, unspecified: Secondary | ICD-10-CM | POA: Insufficient documentation

## 2023-07-12 DIAGNOSIS — Z9889 Other specified postprocedural states: Secondary | ICD-10-CM | POA: Insufficient documentation

## 2023-07-12 DIAGNOSIS — R509 Fever, unspecified: Secondary | ICD-10-CM | POA: Insufficient documentation

## 2023-07-12 LAB — CBC WITH DIFF
BASOPHIL #: 0.1 10*3/uL (ref 0.00–0.10)
BASOPHIL %: 1 % (ref 0–1)
EOSINOPHIL #: 0.2 10*3/uL (ref 0.00–0.50)
EOSINOPHIL %: 2 % (ref 1–7)
HCT: 39.3 % (ref 31.2–41.9)
HGB: 13.5 g/dL (ref 10.9–14.3)
LYMPHOCYTE #: 2.1 10*3/uL (ref 1.00–3.00)
LYMPHOCYTE %: 24 % (ref 16–44)
MCH: 29.7 pg (ref 24.7–32.8)
MCHC: 34.4 g/dL (ref 32.3–35.6)
MCV: 86.2 fL (ref 75.5–95.3)
MONOCYTE #: 0.6 10*3/uL (ref 0.30–1.00)
MONOCYTE %: 6 % (ref 5–13)
MPV: 8.5 fL (ref 7.9–10.8)
NEUTROPHIL #: 5.6 10*3/uL (ref 1.85–7.80)
NEUTROPHIL %: 66 % (ref 43–77)
PLATELETS: 231 10*3/uL (ref 140–440)
RBC: 4.55 10*6/uL (ref 3.63–4.92)
RDW: 12.7 % (ref 12.3–17.7)
WBC: 8.5 10*3/uL (ref 3.8–11.8)

## 2023-07-12 LAB — COMPREHENSIVE METABOLIC PANEL, NON-FASTING
ALBUMIN/GLOBULIN RATIO: 1.8 — ABNORMAL HIGH (ref 0.8–1.4)
ALBUMIN: 4.2 g/dL (ref 3.5–5.7)
ALKALINE PHOSPHATASE: 66 U/L (ref 34–104)
ALT (SGPT): 29 U/L (ref 7–52)
ANION GAP: 9 mmol/L (ref 4–13)
AST (SGOT): 24 U/L (ref 13–39)
BILIRUBIN TOTAL: 0.4 mg/dL (ref 0.3–1.0)
BUN/CREA RATIO: 14 (ref 6–22)
BUN: 13 mg/dL (ref 7–25)
CALCIUM, CORRECTED: 9 mg/dL (ref 8.9–10.8)
CALCIUM: 9.2 mg/dL (ref 8.6–10.3)
CHLORIDE: 104 mmol/L (ref 98–107)
CO2 TOTAL: 27 mmol/L (ref 21–31)
CREATININE: 0.94 mg/dL (ref 0.60–1.30)
ESTIMATED GFR: 76 mL/min/{1.73_m2} (ref >59)
GLOBULIN: 2.3 — ABNORMAL LOW (ref 2.9–5.4)
GLUCOSE: 180 mg/dL — ABNORMAL HIGH (ref 74–109)
OSMOLALITY, CALCULATED: 284 mOsm/kg (ref 270–290)
POTASSIUM: 4.2 mmol/L (ref 3.5–5.1)
PROTEIN TOTAL: 6.5 g/dL (ref 6.4–8.9)
SODIUM: 140 mmol/L (ref 136–145)

## 2023-07-12 LAB — URINALYSIS, MACROSCOPIC
BILIRUBIN: NEGATIVE mg/dL
BLOOD: NEGATIVE mg/dL
GLUCOSE: NEGATIVE mg/dL
KETONES: NEGATIVE mg/dL
LEUKOCYTES: NEGATIVE WBCs/uL
NITRITE: NEGATIVE
PH: 5.5 (ref 5.0–9.0)
PROTEIN: 20 mg/dL
SPECIFIC GRAVITY: 1.029 (ref 1.002–1.030)
UROBILINOGEN: NORMAL mg/dL

## 2023-07-12 LAB — IRON TRANSFERRIN AND TIBC
IRON (TRANSFERRIN) SATURATION: 20 % (ref 15–50)
IRON: 74 ug/dL (ref 50–212)
TOTAL IRON BINDING CAPACITY: 364 ug/dL (ref 250–450)
TRANSFERRIN: 260 mg/dL (ref 203–362)
UIBC: 290 ug/dL (ref 130–375)

## 2023-07-12 LAB — URINALYSIS, MICROSCOPIC: SQUAMOUS EPITHELIAL: 2 /hpf (ref <28)

## 2023-07-12 MED ORDER — AMOXICILLIN 875 MG-POTASSIUM CLAVULANATE 125 MG TABLET
1.0000 | ORAL_TABLET | Freq: Two times a day (BID) | ORAL | 0 refills | Status: AC
Start: 2023-07-12 — End: 2023-07-22

## 2023-07-12 MED ORDER — FLUCONAZOLE 150 MG TABLET
150.0000 mg | ORAL_TABLET | Freq: Once | ORAL | 0 refills | Status: AC
Start: 2023-07-12 — End: 2023-07-12

## 2023-07-12 MED ORDER — GABAPENTIN 800 MG TABLET
800.0000 mg | ORAL_TABLET | Freq: Four times a day (QID) | ORAL | 3 refills | Status: DC
Start: 2023-07-12 — End: 2023-08-23

## 2023-07-12 NOTE — Progress Notes (Signed)
FAMILY MEDICINE, [facility]  [address]  [place] [address]  Operated by [facility]  Progress Note    Name: Lindsay Crawford MRN:  [id number]   Date: 07/12/2023 DOB:  06-06-1976 (47 y.o.)             Chief Complaint: Backache (Back pain)  Subjective   Subjective:   She presents today with complaint of back pain of 4 days duration on the left with pain radiating to the knee. It started when she was washing dishes. Pain comes and goes; moving and standing makes it worse; rest makes it better. She has a gabapentin rx and it has provided some relief. She has had back surgery 19 years ago for a lumbar herniated disc.   She has been bruising easily for a week. Denies fevers but today temp is 100.2, admits to joint aches, worsening fatigue, night sweats; denies urinary frequency and urgency.      Objective   Objective :  BP 138/88 (Site: Left Arm, Patient Position: Sitting, Cuff Size: Adult)   Pulse 94   Temp 37.9 C (100.2 F) (Tympanic)   Ht 1.727 m (5\' 8" )   Wt 114 kg (251 lb)   SpO2 96%   BMI 38.16 kg/m   CV/Resp: RRR, no murmurs or rubs; lungs CTA bilaterally, no wheezes or crackles; pulses +2/4 in LE bilaterally  Head/neck: no conjunctival pallor, PERRLA, buccal mucosa moist, no erythema or exudates; neck supple, no cervical lymphadenopathy, carotid bruits, or thyromegaly  MSK: 5/5 muscle strength in bilateral LE, tenderness to palpation along R lower lumbar spine; positive straight leg tests bilaterally, full ROM in lumbar flexion and extension  Skin: several scattered green bruises along L thigh and on the belly      Data reviewed:    Current Outpatient Medications   Medication Sig    albuterol sulfate (PROVENTIL OR VENTOLIN OR PROAIR) 90 mcg/actuation Inhalation oral inhaler Take 2 Puffs by inhalation Every 6 hours as needed    amoxicillin-pot clavulanate (AUGMENTIN) 875-125 mg Oral Tablet Take 1 Tablet by mouth Twice daily for 10 days     bethanechol chloride (URECHOLINE) 10 mg Oral Tablet Take 1 Tablet (10 mg total) by mouth Twice daily    Biotin 10 mg Oral Tablet Take 1 Tablet (10 mg total) by mouth Once a day    cetirizine (ZYRTEC) 10 mg Oral Tablet Take 1 Tablet (10 mg total) by mouth Once a day    cholecalciferol, vitamin D3, 1,250 mcg (50,000 unit) Oral Capsule Take 1 Capsule (50,000 Units total) by mouth Every 7 days    cyanocobalamin (VITAMIN B 12) 1,000 mcg Oral Tablet Take 1 Tablet (1,000 mcg total) by mouth Once a day    cyclobenzaprine (FLEXERIL) 10 mg Oral Tablet Take 1 Tablet (10 mg total) by mouth Twice daily    famotidine (PEPCID) 40 mg Oral Tablet Take 1 Tablet (40 mg total) by mouth Every evening    fluconazole (DIFLUCAN) 150 mg Oral Tablet Take 1 Tablet (150 mg total) by mouth One time for 1 dose Indications: a yeast infection of the vagina and vulva    gabapentin (NEURONTIN) 800 mg Oral Tablet Take 1 Tablet (800 mg total) by mouth Four times a day    linaCLOtide (LINZESS) 145 mcg Oral Capsule Take 1 Capsule (145 mcg total) by mouth Every morning    magnesium chloride (SLOW-MAG) 64 mg Oral Tablet, Delayed Release (E.C.) Take 1 Tablet (64 mg total) by  mouth Once a day    metoclopramide HCl (REGLAN) 5 mg Oral Tablet Take 1 Tablet (5 mg total) by mouth Once a day    naloxone (NARCAN) 4 mg per spray nasal spray ADMINISTER A SINGLE SPRAY IN ONE NOSTRIL UPON SIGNS OF OPIOID OVERDOSE. CALL 911. REPEAT AFTER 3 MINUTES IF NO RESPONSE.    omeprazole (PRILOSEC) 40 mg Oral Capsule, Delayed Release(E.C.) Take 1 Capsule (40 mg total) by mouth Twice daily    ondansetron (ZOFRAN ODT) 4 mg Oral Tablet, Rapid Dissolve Take 1 Tablet (4 mg total) by mouth Every 8 hours as needed for Nausea/Vomiting    QUEtiapine (SEROQUEL) 100 mg Oral Tablet Take 1 Tablet (100 mg total) by mouth Twice daily    rosuvastatin (CRESTOR) 5 mg Oral Tablet Take 1 Tablet (5 mg total) by mouth Once a day        Assessment/Plan  Problem List Items Addressed This Visit     None  Visit Diagnoses       Fever, unspecified fever cause    -  Primary    Relevant Orders    URINALYSIS, MACROSCOPIC AND MICROSCOPIC W/CULTURE REFLEX    Cellulitis of great toe, right        Relevant Orders    XR FOOT RIGHT    Iron deficiency anemia, unspecified iron deficiency anemia type        Relevant Orders    IRON TRANSFERRIN AND TIBC    Easy bruising        Relevant Orders    CBC/DIFF    COMPREHENSIVE METABOLIC PANEL, NON-FASTING    HEP-2 SUBSTRATE ANTINUCLEAR ANTIBODIES (ANA), SERUM    Strep pharyngitis        Lumbar back pain with radiculopathy affecting left lower extremity        Relevant Orders    Referral to External Provider    MRI SPINE LUMBOSACRAL W/WO CONTRAST    Malar rash        Relevant Orders    HEP-2 SUBSTRATE ANTINUCLEAR ANTIBODIES (ANA), SERUM    History of lumbar surgery                Lois Huxley, MED STUDENT   I was personally present when the student was taking history, performing the exam, and during any medical decision-making activity.  I personally verified the history, performed the exam, and medical decision-making as edited in the student's note. I agree with the medical student's documentation.    Rapid strep positive. With fever today and we will treat with Augmentin.    Keep f/u with surgeon. Cellulitis appears to be resolved. I am going to repeat xray of foot as questionable osteomyleitis. She had another piece of wood that came out two weeks ago and I am not sure all the foreign body is gone but that will not show up on xray. It is not tender to palpation today which is a significant clinical improvement    Refer for MRI. Has radicular sx now down her left leg. SLR positive bilaterally. Hx of surgery so we will get mri with contrast and refer back to her Surgeon who did her procedure in 2019    Will repeat cbc/cmp/iron  Ua done    Weyman Pedro, DO

## 2023-07-14 ENCOUNTER — Ambulatory Visit (INDEPENDENT_AMBULATORY_CARE_PROVIDER_SITE_OTHER): Payer: MEDICAID | Admitting: Surgery

## 2023-07-14 ENCOUNTER — Other Ambulatory Visit: Payer: Self-pay

## 2023-07-14 ENCOUNTER — Encounter (INDEPENDENT_AMBULATORY_CARE_PROVIDER_SITE_OTHER): Payer: Self-pay | Admitting: Surgery

## 2023-07-14 VITALS — BP 153/91 | HR 80 | Temp 97.2°F | Ht 68.0 in | Wt 248.0 lb

## 2023-07-14 DIAGNOSIS — L03032 Cellulitis of left toe: Secondary | ICD-10-CM

## 2023-07-14 DIAGNOSIS — E669 Obesity, unspecified: Secondary | ICD-10-CM

## 2023-07-15 ENCOUNTER — Encounter (INDEPENDENT_AMBULATORY_CARE_PROVIDER_SITE_OTHER): Payer: Self-pay | Admitting: Surgery

## 2023-07-15 LAB — HEP-2 SUBSTRATE ANTINUCLEAR ANTIBODIES (ANA), SERUM: ANA INTERPRETATION: NEGATIVE

## 2023-07-15 NOTE — Progress Notes (Signed)
Office visit      Reason for Visit: Osteomyelitis (Right great toe)    History of Present Illness  Ms. Lindsay Crawford presents  for follow up of possible osteomyelitis of the right great toe.  Feeling better.  No fever or chills.  Symptoms have overall resolved.  Reports removal of the small piece of wood since her last visit.  Nontender.      I have reviewed the patient's provided medical records and diagnostic testing including laboratory values, imaging results, documented encounters and providers notes with all pertinent information noted with respect to today's evaluation serving as unique tests and sources as a component of the medical decision making process for this encounter relevant to the patients independent evaluation by me today.        Patient Data  Patient History  Past Medical History:   Diagnosis Date    Body mass index 35.0-35.9, adult     Borderline diabetes     Chronic low back pain     Dizziness     DUB (dysfunctional uterine bleeding)     Gastroparesis     GERD (gastroesophageal reflux disease)     Hyperglycemia     Hyperlipidemia LDL goal <70     Insomnia     Knee pain, bilateral     Migraine     Paraspinal muscle spasm     Vitamin D deficiency          Past Surgical History:   Procedure Laterality Date    CESAREAN SECTION  2004    COLONOSCOPY      Dr. Lequita Halt in [place]    ENDOMETRIAL ABLATION W/ NOVASURE      dr brodnik 12/14/22    ESOPHAGOSCOPY / EGD      HX APPENDECTOMY      HX BACK SURGERY      november 2019    HX LAP CHOLECYSTECTOMY      LAPAROSCOPIC TUBAL LIGATION  07/30/2022    with D&C brodmik    NISSEN FUNDOPLICATION  08/13/2022    had in [place]         Family Medical History:       Problem Relation (Age of Onset)    COPD Father    Coronary Artery Disease Brother    Diabetes type II Father    Hypertension (High Blood Pressure) Mother, Father    Kidney Disease Father    No Known Problems Half-Sister, Half-Brother, Maternal Aunt, Maternal Uncle, Paternal Aunt, Paternal Uncle, Maternal  Grandmother, Maternal Grandfather, Paternal Grandmother, Paternal Grandfather, Daughter, Son    Throat cancer Sister            Social History     Tobacco Use    Smoking status: Never    Smokeless tobacco: Never   Vaping Use    Vaping status: Never Used   Substance Use Topics    Alcohol use: Never    Drug use: Never        The above documented section regarding past medical, past surgical, family, and social history (PMFSH) has been reviewed and considered and to the best of my knowledge represents a valid and accurate reflection of the patient's previous pertinent experiences documented by multiple providers and participants of the EMR.I cannot attest to all entries but do no recognize any gross inaccuracies as the data is a common field across all providers  Further history pertinent to the current encounter will be found as referenced       Physical Examination:  Vitals:    07/14/23 1021   BP: (!) 153/91   Pulse: 80   Temp: 36.2 C (97.2 F)   SpO2: 99%   Weight: 112 kg (248 lb)   Height: 1.727 m (5\' 8" )   BMI: 37.79        Right great toe without significant cellulitis          Diagnosis:  ENCOUNTER DIAGNOSES     ICD-10-CM   1. Cellulitis of toe of left foot  L03.032   2. Obesity (BMI 35.0-39.9 without comorbidity)  E66.9              Plan:  No need for surgical intervention.  Continue local wound care.  We will see again as needed    This note may have been partially generated using MModal Fluency Direct system, and there may be some incorrect words, spellings, and punctuation that were not noted in checking the note before saving, though effort was made to avoid such errors.      Clide Dales MD FACS RVT  [facility] -General Surgery

## 2023-08-02 ENCOUNTER — Other Ambulatory Visit: Payer: Self-pay

## 2023-08-02 ENCOUNTER — Inpatient Hospital Stay
Admission: RE | Admit: 2023-08-02 | Discharge: 2023-08-02 | Disposition: A | Discharge status: Home / Self Care | Payer: MEDICAID | Source: Ambulatory Visit | Attending: Family Medicine | Admitting: Family Medicine

## 2023-08-02 DIAGNOSIS — M5416 Radiculopathy, lumbar region: Secondary | ICD-10-CM | POA: Insufficient documentation

## 2023-08-02 MED ORDER — GADOBUTROL 10 MMOL/10 ML (1 MMOL/ML) INTRAVENOUS SOLUTION
10.0000 mL | INTRAVENOUS | Status: AC
Start: 2023-08-02 — End: 2023-08-02
  Administered 2023-08-02: 10 mL via INTRAVENOUS

## 2023-08-08 ENCOUNTER — Encounter (INDEPENDENT_AMBULATORY_CARE_PROVIDER_SITE_OTHER): Payer: Self-pay | Admitting: Physician Assistant

## 2023-08-23 ENCOUNTER — Other Ambulatory Visit: Payer: Self-pay

## 2023-08-23 ENCOUNTER — Telehealth (RURAL_HEALTH_CENTER): Payer: Self-pay | Admitting: Family Medicine

## 2023-08-23 ENCOUNTER — Encounter (RURAL_HEALTH_CENTER): Payer: Self-pay | Admitting: Family Medicine

## 2023-08-23 ENCOUNTER — Ambulatory Visit (RURAL_HEALTH_CENTER): Payer: MEDICAID | Attending: Family Medicine | Admitting: Family Medicine

## 2023-08-23 VITALS — BP 120/80 | HR 94 | Temp 100.3°F | Ht 68.0 in | Wt 250.0 lb

## 2023-08-23 DIAGNOSIS — R509 Fever, unspecified: Secondary | ICD-10-CM | POA: Insufficient documentation

## 2023-08-23 DIAGNOSIS — M5416 Radiculopathy, lumbar region: Secondary | ICD-10-CM | POA: Insufficient documentation

## 2023-08-23 DIAGNOSIS — J020 Streptococcal pharyngitis: Secondary | ICD-10-CM | POA: Insufficient documentation

## 2023-08-23 LAB — POCT INFLUENZA A & B
INFLUENZA A: NEGATIVE
INFLUENZA B: NEGATIVE

## 2023-08-23 LAB — POCT RAPID COVID (SOFIA) (AMB ONLY): COVID-19 AG: NEGATIVE

## 2023-08-23 LAB — POCT URINE DIPSTICK
BILIRUBIN: NEGATIVE
BLOOD: NEGATIVE
GLUCOSE: NEGATIVE
KETONE: NEGATIVE
NITRITE: NEGATIVE
PH: 5.5
SPECIFIC GRAVITY: 1.03
UROBILINOGEN: 0.2

## 2023-08-23 LAB — POCT RAPID STREP A: RAPID STREP A (POCT): POSITIVE

## 2023-08-23 MED ORDER — AMOXICILLIN 875 MG-POTASSIUM CLAVULANATE 125 MG TABLET
1.0000 | ORAL_TABLET | Freq: Two times a day (BID) | ORAL | 0 refills | Status: AC
Start: 2023-08-23 — End: 2023-09-02

## 2023-08-23 MED ORDER — PREGABALIN 150 MG CAPSULE
150.0000 mg | ORAL_CAPSULE | Freq: Three times a day (TID) | ORAL | 5 refills | Status: DC
Start: 2023-08-23 — End: 2023-09-20

## 2023-08-23 NOTE — Addendum Note (Signed)
Addended by: Ralene Bathe on: 08/23/2023 04:41 PM     Modules accepted: Orders

## 2023-08-23 NOTE — Progress Notes (Signed)
FAMILY MEDICINE, [facility] FAM[facility]IC[facility]  [address]  [place] [address]  Operated by [facility]     Name: Lindsay Crawford MRN:  [id number]   Date of Birth: 26-Mar-1976 Age: 47 y.o.   Date: 08/23/2023  Time: 14:36     Provider: Weyman Pedro, DO    Assessment/Plan:    1. Fever, unspecified fever cause    - POCT Urine Dipstick    2. Strep pharyngitis  Prescribed Augmentin. Push fluids. Change toothbrush    3. Lumbar back pain with radiculopathy affecting left lower extremity  Stop gabapentin. Rx Lyrica. Add at night and then titrate to tid as needed. Keep appt with specialist. F/u sooner if no pain relief.       No follow-ups on file.  Orders Placed This Encounter    POCT Urine Dipstick    pregabalin (LYRICA) 150 mg Oral Capsule        Discontinued Medications    CYANOCOBALAMIN (VITAMIN B 12) 1,000 MCG ORAL TABLET    Take 1 Tablet (1,000 mcg total) by mouth Once a day    GABAPENTIN (NEURONTIN) 800 MG ORAL TABLET    Take 1 Tablet (800 mg total) by mouth Four times a day    MAGNESIUM CHLORIDE (SLOW-MAG) 64 MG ORAL TABLET, DELAYED RELEASE (E.C.)    Take 1 Tablet (64 mg total) by mouth Once a day      Reason for visit: Follow Up (6 week follow up mri for pinched nerve/Does not feel well no sore throat no cough)      History of Present Illness:  Lindsay Crawford is a 47 y.o. female presenting in follow up of her back pain.10/3 has appt to see another specialist for her back as her doctor retired. Gabapentin not really helping    MRI SPINE LUMBOSACRAL W/WO CONTRAST performed on 08/02/2023 8:01 AM     CLINICAL HISTORY: M54.16: Lumbar back pain with radiculopathy affecting left lower extremity.  Worsening low back pain. Hx of lumar sx in 2019. Lumbar back pain with radiculopathy affecting left lower extremity     TECHNIQUE:  Lumbar spine MRI without and with intravenous gadolinium-based contrast.  INTRAVENOUS CONTRAST: 10 ml's of Gadavist     COMPARISON:  Lumbar spine radiographs  dated 09/27/2019.     FINDINGS:   Vertebrae:  Lumbar vertebral body heights are preserved.  Signal changes about the L4-5 and L5-S1 disc spaces are degenerative in nature.     Alignment:  Normal.  No spondylolisthesis.     Conus Medullaris:  Normally positioned.     L1-2:  Unremarkable     L2-3:  Unremarkable     L3-4:  Unremarkable     L4-5:  Disc bulge and facet hypertrophy. No significant central canal narrowing status post right facetectomy. Bilateral neural foraminal narrowing is mild.     L5-S1:  Unremarkable     Sacrum:  Visualized upper sacrum and SI joints are unremarkable.           Postcontrast images:  Unremarkable        IMPRESSION:  No significant central canal or neural foraminal compromise status post L4 facetectomy.    She was found to have elevated temp. Rapid flu/covid/strep done and strep was positive. No sore throat but she has had chills. 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    Knee pain,  right    Obesity (BMI 35.0-39.9 without comorbidity)    Borderline diabetes    Irritable bowel syndrome with constipation    DUB (dysfunctional uterine bleeding)    Gastroparesis    Hyperlipidemia LDL goal <70    Insomnia    Vitamin D deficiency    Paraspinal muscle spasm    Migraine    GERD (gastroesophageal reflux disease)    Pelvic pain    Menorrhagia with irregular cycle    S/P Nissen fundoplication (without gastrostomy tube) procedure    Vaginal discharge    Fibroids, intramural    Menopausal symptoms    Hair loss    Irregular periods/menstrual cycles    Kidney stone        Historical Data    Past Medical History:  Past Medical History:   Diagnosis Date    Body mass index 35.0-35.9, adult     Borderline diabetes     Chronic low back pain     Dizziness     DUB (dysfunctional uterine bleeding)     Gastroparesis     GERD (gastroesophageal reflux disease)     Hyperglycemia     Hyperlipidemia LDL goal <70     Insomnia     Knee  pain, bilateral     Migraine     Paraspinal muscle spasm     Vitamin D deficiency          Past Surgical History:  Past Surgical History:   Procedure Laterality Date    CESAREAN SECTION  2004    COLONOSCOPY      Dr. Lequita Halt in [place]    ENDOMETRIAL ABLATION W/ NOVASURE      dr brodnik 12/14/22    ESOPHAGOSCOPY / EGD      HX APPENDECTOMY      HX BACK SURGERY      november 2019    HX LAP CHOLECYSTECTOMY      LAPAROSCOPIC TUBAL LIGATION  07/30/2022    with D&C brodmik    NISSEN FUNDOPLICATION  08/13/2022    had in [place]         Allergies:  Allergies   Allergen Reactions    Other Rash     Allergic to the sun      Medications:  albuterol sulfate (PROVENTIL OR VENTOLIN OR PROAIR) 90 mcg/actuation Inhalation oral inhaler, Take 2 Puffs by inhalation Every 6 hours as needed  bethanechol chloride (URECHOLINE) 10 mg Oral Tablet, Take 1 Tablet (10 mg total) by mouth Twice daily  Biotin 10 mg Oral Tablet, Take 1 Tablet (10 mg total) by mouth Once a day  cetirizine (ZYRTEC) 10 mg Oral Tablet, Take 1 Tablet (10 mg total) by mouth Once a day  cholecalciferol, vitamin D3, 1,250 mcg (50,000 unit) Oral Capsule, Take 1 Capsule (50,000 Units total) by mouth Every 7 days  Colestipol (COLESTID) 1 gram Oral Tablet, Take 2 Tablets (2 g total) by mouth Once a day Takes two in am  cyclobenzaprine (FLEXERIL) 10 mg Oral Tablet, Take 1 Tablet (10 mg total) by mouth Twice daily  famotidine (PEPCID) 40 mg Oral Tablet, Take 1 Tablet (40 mg total) by mouth Every evening  linaCLOtide (LINZESS) 145 mcg Oral Capsule, Take 1 Capsule (145 mcg total) by mouth Every morning  metoclopramide HCl (REGLAN) 5 mg Oral Tablet, Take 1 Tablet (5 mg total) by mouth Once a day  naloxone (NARCAN) 4 mg per spray nasal spray, ADMINISTER A SINGLE SPRAY IN ONE NOSTRIL UPON SIGNS OF OPIOID OVERDOSE. CALL  911. REPEAT AFTER 3 MINUTES IF NO RESPONSE.  omeprazole (PRILOSEC) 40 mg Oral Capsule, Delayed Release(E.C.), Take 1 Capsule (40 mg total) by mouth Twice daily  ondansetron  (ZOFRAN ODT) 4 mg Oral Tablet, Rapid Dissolve, Take 1 Tablet (4 mg total) by mouth Every 8 hours as needed for Nausea/Vomiting  QUEtiapine (SEROQUEL) 100 mg Oral Tablet, Take 1 Tablet (100 mg total) by mouth Twice daily  rosuvastatin (CRESTOR) 5 mg Oral Tablet, Take 1 Tablet (5 mg total) by mouth Once a day  cyanocobalamin (VITAMIN B 12) 1,000 mcg Oral Tablet, Take 1 Tablet (1,000 mcg total) by mouth Once a day (Patient not taking: Reported on 08/23/2023)  gabapentin (NEURONTIN) 800 mg Oral Tablet, Take 1 Tablet (800 mg total) by mouth Four times a day  magnesium chloride (SLOW-MAG) 64 mg Oral Tablet, Delayed Release (E.C.), Take 1 Tablet (64 mg total) by mouth Once a day (Patient not taking: Reported on 08/23/2023)    No facility-administered medications prior to visit.     Family History:  Family Medical History:       Problem Relation (Age of Onset)    COPD Father    Coronary Artery Disease Brother    Diabetes type II Father    Hypertension (High Blood Pressure) Mother, Father    Kidney Disease Father    No Known Problems Half-Sister, Half-Brother, Maternal Aunt, Maternal Uncle, Paternal Aunt, Paternal Uncle, Maternal Grandmother, Maternal Grandfather, Paternal Grandmother, Paternal Grandfather, Daughter, Son    Throat cancer Sister            Social History:  Social History     Socioeconomic History    Marital status: Widowed    Number of children: 1    Years of education: 12+    Highest education level: Associate degree: occupational, Scientist, product/process development, or vocational program   Tobacco Use    Smoking status: Never    Smokeless tobacco: Never   Vaping Use    Vaping status: Never Used   Substance and Sexual Activity    Alcohol use: Never    Drug use: Never    Sexual activity: Yes     Partners: Male     Birth control/protection: None     Social Determinants of Health     Financial Resource Strain: Low Risk  (12/29/2022)    Financial Resource Strain     SDOH Financial: No   Transportation Needs: Low Risk  (12/29/2022)     Transportation Needs     SDOH Transportation: No   Social Connections: Low Risk  (12/29/2022)    Social Connections     SDOH Social Isolation: 5 or more times a week   Intimate Partner Violence: Low Risk  (12/29/2022)    Intimate Partner Violence     SDOH Domestic Violence: No   Housing Stability: Low Risk  (12/29/2022)    Housing Stability     SDOH Housing Situation: I have housing.     SDOH Housing Worry: No           Review of Systems:  Any pertinent Review of Systems as addressed in the HPI above.    Physical Exam:  Vital Signs:  Vitals:    08/23/23 1346   BP: 120/80   Pulse: 94   Temp: 37.9 C (100.3 F)   TempSrc: Tympanic   SpO2: 96%   Weight: 113 kg (250 lb)   Height: 1.727 m (5\' 8" )   BMI: 38.09     Physical Exam  Vitals reviewed.  Constitutional:       General: She is not in acute distress.     Appearance: She is obese.   HENT:      Head: Normocephalic and atraumatic.      Right Ear: There is no impacted cerumen.      Left Ear: There is no impacted cerumen.      Nose: No congestion or rhinorrhea.      Mouth/Throat:      Pharynx: Posterior oropharyngeal erythema present. No oropharyngeal exudate.   Eyes:      General:         Right eye: No discharge.         Left eye: No discharge.   Neck:      Vascular: No carotid bruit.   Cardiovascular:      Rate and Rhythm: Normal rate and regular rhythm.      Pulses: Normal pulses.      Heart sounds: Normal heart sounds.   Pulmonary:      Effort: Pulmonary effort is normal.      Breath sounds: Normal breath sounds.   Abdominal:      Palpations: Abdomen is soft.      Tenderness: There is no abdominal tenderness.   Musculoskeletal:         General: Tenderness present.      Right lower leg: No edema.      Left lower leg: No edema.   Lymphadenopathy:      Cervical: No cervical adenopathy.   Skin:     General: Skin is warm and dry.      Capillary Refill: Capillary refill takes less than 2 seconds.   Neurological:      General: No focal deficit present.      Mental Status: She  is alert and oriented to person, place, and time.   Psychiatric:         Mood and Affect: Mood normal.         Behavior: Behavior normal.         Weyman Pedro, DO     Portions of this note may be dictated using voice recognition software or a dictation service. Variances in spelling and vocabulary are possible and unintentional. Not all errors are caught/corrected. Please notify the Thereasa Parkin if any discrepancies are noted or if the meaning of any statement is not clear.

## 2023-08-24 NOTE — Telephone Encounter (Signed)
Per Dr. Suzette Battiest told patient ok to take medication with Seroquel and muscle relaxer but to start out slow with just at night for a few days and then work up to 2 until she knows how it will affect her.

## 2023-09-02 ENCOUNTER — Other Ambulatory Visit: Payer: Self-pay

## 2023-09-02 ENCOUNTER — Ambulatory Visit (INDEPENDENT_AMBULATORY_CARE_PROVIDER_SITE_OTHER): Payer: MEDICAID | Admitting: Physician Assistant

## 2023-09-02 VITALS — BP 138/77 | HR 83

## 2023-09-02 DIAGNOSIS — N132 Hydronephrosis with renal and ureteral calculous obstruction: Secondary | ICD-10-CM

## 2023-09-02 DIAGNOSIS — R319 Hematuria, unspecified: Secondary | ICD-10-CM

## 2023-09-02 DIAGNOSIS — R109 Unspecified abdominal pain: Secondary | ICD-10-CM

## 2023-09-02 NOTE — Progress Notes (Signed)
UROLOGY, [facility]  [address] [place] [address]    Progress Note    Name: Lindsay Crawford MRN:  [id number]   Date: 09/02/2023 DOB:  03-Jan-1976 (47 y.o.)             Chief Complaint: Follow Up 3 Months and Kidney Stones  Subjective   Subjective:   Lindsay Crawford is a pleasant 47 y.o. White female whom presents to the clinic for nephrolithiasis. Patient with right flank and right abdominal pain x 2 weeks. Patient seen at [facility] ER and CT abdomen pelvis revealed right 5mm with right hydronephrosis. History renal calcui without extractions. Patient with continued right flank pain now radiating to abdomen with urinary urgency and frequency. No fever, chills, nausea or vomiting.  Patient denies personal or family history of urinary malignancy. Patient denies occupational chemical exposure. Patient denies history of tobacco use.         Objective   Objective :  BP 138/77 (Site: Right Arm, Patient Position: Sitting, Cuff Size: Adult)   Pulse 83       Gen: NAD, alert  Pulm: unlabored at rest  CV: palpable pulses  Abd: soft, Nt/ND  GU: no suprapubic tenderness, no CVAT    Data reviewed:    Current Outpatient Medications   Medication Sig    albuterol sulfate (PROVENTIL OR VENTOLIN OR PROAIR) 90 mcg/actuation Inhalation oral inhaler Take 2 Puffs by inhalation Every 6 hours as needed    amoxicillin-pot clavulanate (AUGMENTIN) 875-125 mg Oral Tablet Take 1 Tablet by mouth Twice daily for 10 days    bethanechol chloride (URECHOLINE) 10 mg Oral Tablet Take 1 Tablet (10 mg total) by mouth Twice daily    Biotin 10 mg Oral Tablet Take 1 Tablet (10 mg total) by mouth Once a day    cetirizine (ZYRTEC) 10 mg Oral Tablet Take 1 Tablet (10 mg total) by mouth Once a day    cholecalciferol, vitamin D3, 1,250 mcg (50,000 unit) Oral Capsule Take 1 Capsule (50,000 Units total) by mouth Every 7 days    Colestipol (COLESTID) 1 gram Oral Tablet Take 2 Tablets (2 g total) by mouth Once a day Takes two in am     cyclobenzaprine (FLEXERIL) 10 mg Oral Tablet Take 1 Tablet (10 mg total) by mouth Twice daily    famotidine (PEPCID) 40 mg Oral Tablet Take 1 Tablet (40 mg total) by mouth Every evening    linaCLOtide (LINZESS) 145 mcg Oral Capsule Take 1 Capsule (145 mcg total) by mouth Every morning    metoclopramide HCl (REGLAN) 5 mg Oral Tablet Take 1 Tablet (5 mg total) by mouth Once a day    naloxone (NARCAN) 4 mg per spray nasal spray ADMINISTER A SINGLE SPRAY IN ONE NOSTRIL UPON SIGNS OF OPIOID OVERDOSE. CALL 911. REPEAT AFTER 3 MINUTES IF NO RESPONSE.    omeprazole (PRILOSEC) 40 mg Oral Capsule, Delayed Release(E.C.) Take 1 Capsule (40 mg total) by mouth Twice daily    ondansetron (ZOFRAN ODT) 4 mg Oral Tablet, Rapid Dissolve Take 1 Tablet (4 mg total) by mouth Every 8 hours as needed for Nausea/Vomiting    pregabalin (LYRICA) 150 mg Oral Capsule Take 1 Capsule (150 mg total) by mouth Three times a day    QUEtiapine (SEROQUEL) 100 mg Oral Tablet Take 1 Tablet (100 mg total) by mouth Twice daily    rosuvastatin (CRESTOR) 5 mg Oral Tablet Take 1 Tablet (5 mg total) by mouth Once a day  Assessment/Plan  Problem List Items Addressed This Visit    None  Right flank pain with known right mid 5mm ureterolithiasis with right hydronephrosis, punctate calculi in upper pole; punctate left renal calculus  I discussed the differential diagnosis, pathophysiology and nature of urolithiasis and recurrent stone disease.  Patient was counseled on available treatment options in the management of their calculus.  Conservative, medical expulsive therapy consisting of alpha-blocker therapy, analgesia and directed fluids which is generally reserved for ureteral stones <5 mm without complicating factors (infection, intractable pain/nausea)  The patient was counseled regarding treatment options for urinary calculi including observation, extracorporeal shockwave lithotripsy (ESWL), ureteroscopic extraction/intracorporeal laser lithotripsy,  percutaneous nephrolithotomy (PCNL), or open ureterolithotomy/nephrolithotomy. The risks, advantages, and disadvantages of each were discussed.   Patient understands that the risks include but are not limited to bleeding, infection, damage to adjacent tissues, renal fracture/contusion/hemorrhage, anesthesia risks, loss of the kidney, steinstrasse, therapeutic failure, possible need for post-operative drains/stents, need for additional procedures.  After further discussion, patient has elected CT Abdomen/Pelvis w/o con  patient understands that more emergent intervention will be indicated should they develop fevers/urinary tract infection, intractable pain and/or nausea     Dysuria with flank pain  CT Abdomen/Pelvis w/o  Urine culture    Recurrent Nephrolithiasis  I discussed the differential diagnosis, pathophysiology and nature of asymptomatic non-obstructing renal stones as well as the natural history  Based on the current best-available data [J Urol. 2015 Apr;193(4):1265-9] with an average follow-up of 3.5 years:  Approximately 60% of patients will remain asymptomatic  Less than 30% will experience renal colic  Less than 20% will require operative intervention for pain  Spontaneous stone passage will occur in 7% of cases  Importantly, patient was counseled on risk of silent obstruction causing hydronephrosis and requiring intervention    Patient was counseled on the need for possible future treatment for urinary calculi including observation, extracorporeal shockwave lithotripsy (ESWL), ureteroscopic extraction/intracorporeal laser lithotripsy, percutaneous nephrolithotomy (PCNL), or open ureterolithotomy/nephrolithotomy. The risks, advantages, and disadvantages of each were discussed.   I reviewed the recent 2014 American Urological Association guideline on "Medical Management of Kidney Stones" and specifically discussed dietary therapies including:  Increase fluid intake to achieve urine output of at least 2.5  liters daily  Limit sodium intake to no more than 100 mEq (2,300 mg) per day  Consume 1,000-1,200 mg of dietary calcium per day  Limit oxalate-rich foods (beets, spinach, rhubarb, nuts, chocolate)  Limit non-dairy animal protein  Encouraged incresed fruit and vegetable intake  Patient was provided with written stone prevention recommendations.  Patient was also counseled on the benefits of further serum and 24 hour urine testing to identify the metabolic disorders responsible for recurrent stone disease   24h Urinalysis     Peyson Delao, PA-C

## 2023-09-09 ENCOUNTER — Telehealth (RURAL_HEALTH_CENTER): Payer: Self-pay | Admitting: Family Medicine

## 2023-09-14 ENCOUNTER — Other Ambulatory Visit: Payer: Self-pay

## 2023-09-14 ENCOUNTER — Ambulatory Visit (HOSPITAL_COMMUNITY)
Admission: RE | Admit: 2023-09-14 | Discharge: 2023-09-14 | Disposition: A | Discharge status: Still In Hospital | Payer: MEDICAID | Source: Ambulatory Visit

## 2023-09-14 DIAGNOSIS — G8929 Other chronic pain: Secondary | ICD-10-CM

## 2023-09-14 DIAGNOSIS — M5416 Radiculopathy, lumbar region: Secondary | ICD-10-CM | POA: Insufficient documentation

## 2023-09-14 DIAGNOSIS — M545 Low back pain, unspecified: Secondary | ICD-10-CM | POA: Insufficient documentation

## 2023-09-14 NOTE — PT Evaluation (Signed)
[facility]  Outpatient Physical Therapy  [address]  [place], [address]  [phone]  (Fax) [phone]      Physical Therapy Lumbar Evaluation    Date: 09/14/2023  Patient's Name: Lindsay Crawford  Date of Birth: 06/21/1976  Physical Therapy Evaluation      PT diagnosis/Reason for Referral: Low back pain             SUBJECTIVE    HPI: Patient reports low back pain for several years, reports she had a L4/L5 facetectomy surgery in 2019. States she felt better after this for some time but reinjured her back around one year ago. Is hopeful for an upcoming Level 2 spinal fusion but was told she must lose 30 pounds first.     Date of onset: Chronic    Mechanism of injury: Insidious     Previous episodes/treatments: PT for low back pain prior to this episode    Past Medical History:   Past Medical History:   Diagnosis Date    Body mass index 35.0-35.9, adult     Borderline diabetes     Chronic low back pain     Dizziness     DUB (dysfunctional uterine bleeding)     Gastroparesis     GERD (gastroesophageal reflux disease)     Hyperglycemia     Hyperlipidemia LDL goal <70     Insomnia     Knee pain, bilateral     Migraine     Paraspinal muscle spasm     Vitamin D deficiency          Past Surgical History:   Past Surgical History:   Procedure Laterality Date    CESAREAN SECTION  2004    COLONOSCOPY      Dr. Lequita Halt in [place]    ENDOMETRIAL ABLATION W/ NOVASURE      dr brodnik 12/14/22    ESOPHAGOSCOPY / EGD      HX APPENDECTOMY      HX BACK SURGERY      november 2019    HX LAP CHOLECYSTECTOMY      LAPAROSCOPIC TUBAL LIGATION  07/30/2022    with D&C brodmik    NISSEN FUNDOPLICATION  08/13/2022    had in [place]       Medications for this problem: pain medication, muscle relaxer, and anti-inflammatory    Diagnostic tests: "XR Lumbar Spine, 4 View.     CLINICAL HISTORY:   (4 VIEWS)  lumbar pain   COMPARISON:   CT/SR - CT ABDOMEN AND PELVIS W/WO CONT - 03/12/2023 08:16 PM EDT-axial images  only   X-ray lumbar spine October 06, 2018   FINDINGS:   Vertebral body heights are maintained.  Disc space narrowing and osteophytosis noted at L4-5 and L5-S1 with facet degenerative change and foraminal narrowings.  No significant listhesis.  No dynamic instability.   IMPRESSION:   Multilevel degenerative change of lumbar spine at L4-5 and L5-S1 without clear acute findings"    "MRI SPINE LUMBOSACRAL W/WO CONTRAST performed on 08/02/2023 8:01 AM  CLINICAL HISTORY: M54.16: Lumbar back pain with radiculopathy affecting left lower extremity.  Worsening low back pain. Hx of lumar sx in 2019. Lumbar back pain with radiculopathy affecting left lower extremity  TECHNIQUE:  Lumbar spine MRI without and with intravenous gadolinium-based contrast.  INTRAVENOUS CONTRAST: 10 ml's of Gadavist  COMPARISON:  Lumbar spine radiographs dated 09/27/2019.  FINDINGS:   Vertebrae:  Lumbar vertebral body heights are preserved.  Signal  changes about the L4-5 and L5-S1 disc spaces are degenerative in nature.  Alignment:  Normal.  No spondylolisthesis.  Conus Medullaris:  Normally positioned.  L1-2:  Unremarkable  L2-3:  Unremarkable  L3-4:  Unremarkable  L4-5:  Disc bulge and facet hypertrophy. No significant central canal narrowing status post right facetectomy. Bilateral neural foraminal narrowing is mild.  L5-S1:  Unremarkable  Sacrum:  Visualized upper sacrum and SI joints are unremarkable.  Postcontrast images:  Unremarkable  IMPRESSION:  No significant central canal or neural foraminal compromise status post L4 facetectomy."    Patient goals: REDUCE PAIN, RETURN TO WORK, and NORMALIZE FUNCTION    Occupation:  Employed full time in Human resources officer service    Next MD visit: Nov 18th, 2024    Pain location: lower back (midline on right)                    Pain description: SHARP and BURNING    Pain frequency:  CONTINUOUS and INTERMITTENT exacerbations    Pain rating: Now 6/10   Best 3/10   Worst 10/10    Radiculopathy: Yes, down RLE into  feet    Pain increases with: ACTIVITY, EXERCISE, STAND, WALK, STAIR CLIMBING, BENDING, and LIFTING           decreases with : HEAT, MEDICATION, and REST    Sensation: Reports paraesthesias in RUE and RLE    Weakness: Yes, reports BLE buckling    Bowel/bladder problems: Denies    Subjective Functional Reports:    Sitting: WFL    Standing: LIMITED    Walking: LIMITED    Lifting: LIMITED         OBJECTIVE    Patient-Specific Functional Score:    Problem Score   1. Daily house work (dishes, cooking) 4   2. Ambulatory function 4   3. Pain free days 0   Total 2.6   Total score = sum of the activity scores/number of activities    Minimal detectable change (90% CI) for avg score = 2 points    Minimal detectable change (90% CI) for single activity score = 3 points         Lumbar AROM   right left   Flexion 36    Extension 14, *inc pain    Sidebend 10 *inc pain 13   Rotation 3, *inc pain 5     Strength     right left   Hip flexion (L1,2) 4+ 4+   Hip extension (L5,S1,2) NT NT   Knee flexion (S1) 4+ 4+   Knee extension (L2-4) 4+ 4+   Ankle DF (L4-5) 4+ 4+   Ankle PF (S1,2) 4+ 4+     Palpation: Painful to palpation at mid to lower lumbar segments     Joint mobility: Hypomobile mid to lower lumbar segments    Posture: INCREASED THORACIC KYPHOSIS and FORWARD HEAD    Gait: NO ASSISTIVE DEVICE, RECIPROCATING STEPS WITH SYMMETRIC STRIDE LENGTH, and INITIATION OF GAIT WITHOUT HESITATION    Reflexes    Patellar 1+ BLE   Achilles 1+ BLE     Pathologic Reflexes:  Clonus- Negative     Special tests:   Slump Test:  Negative Right and Negative Left, reports muscular tension only    SLR: Negative Right and Negative Left, reports muscular tension only    DLLT: Loses contact with support surface at 75 degrees, "poor"    Treatment provided:REVIEW OF POC AND GOALS WITH PATIENT, ALL QUESTIONS ANSWERED, PATIENT  EDUCATION, and THERAPEUTIC EXERCISE     Access Code: [email or web address]  URL: [email or web address]  Date: 09/14/2023  Prepared by:  Saia Derossett    Exercises  - Supine Lower Trunk Rotation  - 1 x daily - 5 x weekly - 3 sets - 10 reps  - Supine Single Knee to Chest Stretch  - 1 x daily - 5 x weekly - 3 sets - 10 reps  - Child's Pose Stretch  - 1 x daily - 5 x weekly - 3 sets - 10 reps  - Seated Lumbar Flexion Stretch  - 1 x daily - 5 x weekly - 3 sets - 30s time    Patient Education  - Lumbar Stenosis          ASSESSMENT    Impression: Patient is a 47 year old female with chronic back pain and history of L4/L5 facetectomy on right in 2019. Reports exacerbation in low back pain following re-injury approximately one year ago related to repetitive bending/twisting during house cleaning. Lumbar range of motion limited in all planes with pain provocation during active movement into extension and right sided lateral flexion and rotation. Strength within functional limits and symmetrical. Results of special tests indicate no evidence of lumbosacral radiculopathy related to discal pathology. Lumbar spondylosis is the likely driver of patient's pain and radicular symptoms.  Additionally, patient scored "poor" on double limb lowering test, indicating abdominal muscle strength deficits resulting in inability to maintain posterior pelvic tilting position which can cause poor posture and contribute to lower back pain. Patient will benefit from PT intervention to work on increasing functional strength and range of motion to improve ADL capacity and quality of life. Initiated HEP working in flexion preference to increase lumbar mobility within pain free range.      Rehab potential: FAIR       Short Term Goals: 3 Weeks   -Reduce radicular symptoms with job performance (bending, twisting, lifting).  -Increase lumbar FWB AROM by 5 degrees into flexion/extension, lateral flexion, and rotation.  -Improve score on PSFS to at least 4.6 indicating improved ADL capacity and quality of life. (2.6 on 10/16; MDC =2)        Long Term Goals: 6 Weeks  -ROM / Strength WFL to permit  work / Copywriter, advertising / household  / recreational ADL's without Compensatory mechanics due to pain or weakness.   -Community ambulation not limited by back pain.   -Improve score on PSFS to at least 6.6 indicating improved ADL capacity and quality of life. (MDC =2)    PLAN  Patient will attend 2 times per week x 6 weeks. Therapy may include, but is not limited to THERAPEUTIC EXERCISES, MYOFASCIAL/JOINT MOBILIZATION, POSTURE/BODY MECHANICS, ERGONOMIC TRAINING, TRANSFER/GAIT TRAINING, HOME INSTRUCTIONS, HEAT/COLD, ULTRASOUND, ELECTRICAL STIMULATION, KINESIOTAPE, and MECHANICAL TRACTION    Plan for next visit: Assess tolerance to HEP. Assess hip extensor and abductor strength.        Evaluation complexity:   Personal factors impacting POC: FREQUENT OR CHRONIC PAIN   Co-morbidities impacting POC: PREVIOUS SURGERIES  Complexity of physical exam: INCLUDING MUSCULOSKELETAL SYSTEM (POSTURE, ROM, STRENGTH, HEIGHT/WEIGHT), INCLUDING NEUROMUSCULAR EXAM (BALANCE, GAIT, LOCOMOTION, MOBILITY), INCLUDING ACTIVITY/MOBILITY RESTRICTIONS, and REFERRAL IS FOR A CHRONIC PROBLEM   Clinical Presentation: STABLE   Evaluation Complexity: LOW-HISTORY 0, EXAMINATION 1-2, STABLE PRESENTATION        Total Session Time 45, Timed code minutes 15, and Untimed code minutes 30        Intervention minutes: EVALUATION 30 minutes and THERAPEUTIC EXERCISE 15 minutes  Koichi Platte, PT  09/14/2023, 14:00                Certification:    From:______  Through:_________    I certify the need for these services furnished under this plan of treatment and while under my care.    Referring Provider Signature: _______________     Date : _____________________    Printed name of Referring Provider: ___________________________________________

## 2023-09-15 ENCOUNTER — Other Ambulatory Visit (HOSPITAL_COMMUNITY): Payer: Self-pay

## 2023-09-15 DIAGNOSIS — G8929 Other chronic pain: Secondary | ICD-10-CM

## 2023-09-19 ENCOUNTER — Ambulatory Visit (HOSPITAL_COMMUNITY)
Admission: RE | Admit: 2023-09-19 | Discharge: 2023-09-19 | Disposition: A | Discharge status: Still In Hospital | Payer: MEDICAID | Source: Ambulatory Visit

## 2023-09-19 ENCOUNTER — Other Ambulatory Visit: Payer: Self-pay

## 2023-09-19 NOTE — PT Treatment (Signed)
[facility]  Outpatient Physical Therapy  [address]  [place], [address]  [phone]  (Fax) [phone]    Physical Therapy Treatment Note    Date: 09/19/2023  Patient's Name: Lindsay Crawford  Date of Birth: [birth year]  Physical Therapy Visit    Visit #/POC: 2/12  Authorization:1 out of up 15  POC Signed?: N/A  POC Ends: 10/26/2023  Order Ends: 10/12/2023  Next Progress Note Due: Visit 6 or 30 days    Evaluating Physical Therapist: Ilsa Iha , PT, DPT   PT diagnosis/Reason for Referral: Low back pain  Next Scheduled Physician Appointment: Unknown   Allergies/Contraindications: N/A      Subjective: Patient reports she was working earlier today which exacerbated her low back pain. She states it is currently at 7/10 on pain scale. 15 min late for appointment today due to traffic.    Objective: Treatment as follows:      EXERCISE/ACTIVITY NAME REPETITIONS RESISTANCE COMPLETED THIS DOS   DKTC over yellow physioball   X1 min, twice    Yes   Reciprocal UE/LE reaching in quadruped (piece wise bird dog)   X1 min, twice    Yes   Bird dog X1 min, twice  Yes   Modified plank on knees   X30s, twice    Yes   Plank on forearms x30s  Yes   HEP Review         Yes     HEP:  Access Code: [email or web address]  URL: [email or web address]  Date: 09/14/2023    Exercises  - Supine Lower Trunk Rotation  - 1 x daily - 5 x weekly - 3 sets - 10 reps  - Supine Single Knee to Chest Stretch  - 1 x daily - 5 x weekly - 3 sets - 10 reps  - Child's Pose Stretch  - 1 x daily - 5 x weekly - 3 sets - 10 reps  - Seated Lumbar Flexion Stretch  - 1 x daily - 5 x weekly - 3 sets - 30s times    Assessment: Initiated lumbar stabilization in quadruped with good tolerance, no reports of increased pain or radicular symptoms. Patient was able to progress to full plank on forearms for 30s hold during session. Will continue to benefit from PT intervention.                         Short Term Goals: 3 Weeks               -Reduce radicular symptoms with job performance (bending, twisting, lifting).  -Increase lumbar FWB AROM by 5 degrees into flexion/extension, lateral flexion, and rotation.  -Improve score on PSFS to at least 4.6 indicating improved ADL capacity and quality of life. 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              -Community ambulation not limited by back pain.              -Improve score on PSFS to at least 6.6 indicating improved ADL capacity and quality of life. (MDC =2)    Plan:  Continue lumbar stabilization program. Next, add lateral flexion stretch.     Total Session Time 30 and Timed code minutes  30  THERAPEUTIC EXERCISE 30 minutes      Alvenia Treese, PT  09/19/2023, 15:44

## 2023-09-20 ENCOUNTER — Other Ambulatory Visit: Payer: Self-pay

## 2023-09-20 ENCOUNTER — Encounter (RURAL_HEALTH_CENTER): Payer: Self-pay | Admitting: Family Medicine

## 2023-09-20 ENCOUNTER — Ambulatory Visit (RURAL_HEALTH_CENTER): Payer: MEDICAID | Attending: Family Medicine | Admitting: Family Medicine

## 2023-09-20 VITALS — BP 126/80 | HR 76 | Temp 100.0°F | Wt 253.0 lb

## 2023-09-20 DIAGNOSIS — J020 Streptococcal pharyngitis: Secondary | ICD-10-CM | POA: Insufficient documentation

## 2023-09-20 DIAGNOSIS — M5416 Radiculopathy, lumbar region: Secondary | ICD-10-CM | POA: Insufficient documentation

## 2023-09-20 LAB — POCT URINE DIPSTICK
BILIRUBIN: NEGATIVE
BLOOD: NEGATIVE
GLUCOSE: NEGATIVE
KETONE: NEGATIVE
LEUKOCYTES: NEGATIVE
NITRITE: NEGATIVE
PH: 6
PROTEIN: NEGATIVE
SPECIFIC GRAVITY: 1.025
UROBILINOGEN: 0.5

## 2023-09-20 LAB — POCT RAPID STREP A: RAPID STREP A (POCT): POSITIVE

## 2023-09-20 MED ORDER — AMOXICILLIN 875 MG-POTASSIUM CLAVULANATE 125 MG TABLET
1.0000 | ORAL_TABLET | Freq: Two times a day (BID) | ORAL | 0 refills | Status: DC
Start: 2023-09-20 — End: 2023-10-10

## 2023-09-20 MED ORDER — BELBUCA 150 MCG BUCCAL FILM
150.0000 ug | ORAL_FILM | Freq: Two times a day (BID) | BUCCAL | 5 refills | Status: DC
Start: 2023-09-20 — End: 2023-09-29

## 2023-09-20 MED ORDER — PREGABALIN 200 MG CAPSULE
200.0000 mg | ORAL_CAPSULE | Freq: Three times a day (TID) | ORAL | 5 refills | Status: DC
Start: 2023-09-20 — End: 2023-12-20

## 2023-09-20 MED ORDER — WEGOVY 0.25 MG/0.5 ML SUBCUTANEOUS PEN INJECTOR
0.2500 mg | PEN_INJECTOR | SUBCUTANEOUS | 0 refills | Status: DC
Start: 2023-09-20 — End: 2023-10-04

## 2023-09-20 NOTE — Progress Notes (Unsigned)
FAMILY MEDICINE, [facility] FAMILY MEDICINE [facility]  [address]  [place] [address]  Operated by [facility]     Name: Lindsay Crawford MRN:  [id number]   Date of Birth: [birth year] Age: 47 y.o.   Date: 09/20/2023  Time: 12:39     Provider: Weyman Pedro, DO    Assessment/Plan:    1. Strep pharyngitis  Prescribed augmentin    2. Lumbar back pain with radiculopathy affecting left lower extremity  Add belbucca bid.  Increase Lyrica 200 mg po tid  Discussed pharmacy may not dispense new dose as she just got the lower Lyrica dose but that will be up to them.  Controlled substance contract on file   Will f/u in two weeks to see if she is benefitting    Encouraged weight loss; keep appt with bariatric surgeon    No follow-ups on file.  Orders Placed This Encounter    buprenorphine HCL (BELBUCA) 150 mcg Buccal Film    semaglutide, weight loss, (WEGOVY) 0.25 mg/0.5 mL Subcutaneous Pen Injector    pregabalin (LYRICA) 200 mg Oral Capsule    amoxicillin-pot clavulanate (AUGMENTIN) 875-125 mg Oral Tablet        Discontinued Medications    PREGABALIN (LYRICA) 150 MG ORAL CAPSULE    Take 1 Capsule (150 mg total) by mouth Three times a day      Reason for visit: Follow Up (Was follow up medication however has fever\/ 100 today/Left ear pain)      History of Present Illness:  [name] is a 47 y.o. female presenting with fever and chronic back pain.    Fever started last night. Took shower and temp was 101 and dropped to 99 after shower. Does not feel poorly. No sore throat. Has some sinus drainage    She did see the spinal surgeon. She is going to have epidural injection to see if that helps.  He recommended a spinal fusion if that does not help. Has an appt with bariatric surgeon. Pain is 7/10. 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    Knee pain, right    Obesity (BMI 35.0-39.9  without comorbidity)    Borderline diabetes    Irritable bowel syndrome with constipation    DUB (dysfunctional uterine bleeding)    Gastroparesis    Hyperlipidemia LDL goal <70    Insomnia    Vitamin D deficiency    Paraspinal muscle spasm    Migraine    GERD (gastroesophageal reflux disease)    Pelvic pain    Menorrhagia with irregular cycle    S/P Nissen fundoplication (without gastrostomy tube) procedure    Vaginal discharge    Fibroids, intramural    Menopausal symptoms    Hair loss    Irregular periods/menstrual cycles    Kidney stone    Chronic right-sided low back pain        Historical Data    Past Medical History:  Past Medical History:   Diagnosis Date    Body mass index 35.0-35.9, adult     Borderline diabetes     Chronic low back pain     Dizziness     DUB (dysfunctional uterine bleeding)     Gastroparesis     GERD (gastroesophageal reflux disease)     Hyperglycemia     Hyperlipidemia LDL goal <70  Insomnia     Knee pain, bilateral     Migraine     Paraspinal muscle spasm     Vitamin D deficiency          Past Surgical History:  Past Surgical History:   Procedure Laterality Date    CESAREAN SECTION  2004    COLONOSCOPY      Dr. Lequita Halt in [place]    ENDOMETRIAL ABLATION W/ NOVASURE      dr brodnik 12/14/22    ESOPHAGOSCOPY / EGD      HX APPENDECTOMY      HX BACK SURGERY      november 2019    HX LAP CHOLECYSTECTOMY      LAPAROSCOPIC TUBAL LIGATION  07/30/2022    with D&C brodmik    NISSEN FUNDOPLICATION  08/13/2022    had in [place]         Allergies:  Allergies   Allergen Reactions    Other Rash     Allergic to the sun      Medications:  albuterol sulfate (PROVENTIL OR VENTOLIN OR PROAIR) 90 mcg/actuation Inhalation oral inhaler, Take 2 Puffs by inhalation Every 6 hours as needed  bethanechol chloride (URECHOLINE) 10 mg Oral Tablet, Take 1 Tablet (10 mg total) by mouth Twice daily  Biotin 10 mg Oral Tablet, Take 1 Tablet (10 mg total) by mouth Once a day  cetirizine (ZYRTEC) 10 mg Oral Tablet, Take 1  Tablet (10 mg total) by mouth Once a day  cholecalciferol, vitamin D3, 1,250 mcg (50,000 unit) Oral Capsule, Take 1 Capsule (50,000 Units total) by mouth Every 7 days  Colestipol (COLESTID) 1 gram Oral Tablet, Take 2 Tablets (2 g total) by mouth Once a day Takes two in am  cyclobenzaprine (FLEXERIL) 10 mg Oral Tablet, Take 1 Tablet (10 mg total) by mouth Twice daily  famotidine (PEPCID) 40 mg Oral Tablet, Take 1 Tablet (40 mg total) by mouth Every evening  linaCLOtide (LINZESS) 145 mcg Oral Capsule, Take 1 Capsule (145 mcg total) by mouth Every morning  metoclopramide HCl (REGLAN) 5 mg Oral Tablet, Take 1 Tablet (5 mg total) by mouth Once a day (Patient taking differently: Take 1 Tablet (5 mg total) by mouth Once a day Patient taking twice a day)  naloxone (NARCAN) 4 mg per spray nasal spray, ADMINISTER A SINGLE SPRAY IN ONE NOSTRIL UPON SIGNS OF OPIOID OVERDOSE. CALL 911. REPEAT AFTER 3 MINUTES IF NO RESPONSE.  omeprazole (PRILOSEC) 40 mg Oral Capsule, Delayed Release(E.C.), Take 1 Capsule (40 mg total) by mouth Twice daily  ondansetron (ZOFRAN ODT) 4 mg Oral Tablet, Rapid Dissolve, Take 1 Tablet (4 mg total) by mouth Every 8 hours as needed for Nausea/Vomiting  QUEtiapine (SEROQUEL) 100 mg Oral Tablet, Take 1 Tablet (100 mg total) by mouth Twice daily  rosuvastatin (CRESTOR) 5 mg Oral Tablet, Take 1 Tablet (5 mg total) by mouth Once a day  pregabalin (LYRICA) 150 mg Oral Capsule, Take 1 Capsule (150 mg total) by mouth Three times a day    No facility-administered medications prior to visit.     Family History:  Family Medical History:       Problem Relation (Age of Onset)    COPD Father    Coronary Artery Disease Brother    Diabetes type II Father    Hypertension (High Blood Pressure) Mother, Father    Kidney Disease Father    No Known Problems Half-Sister, Half-Brother, Maternal Aunt, Maternal Uncle, Paternal Aunt, Paternal Uncle, Maternal Grandmother, Maternal  Grandfather, Paternal Grandmother, Paternal  Grandfather, Daughter, Son    Throat cancer Sister            Social History:  Social History     Socioeconomic History    Marital status: Widowed    Number of children: 1    Years of education: 12+    Highest education level: Associate degree: occupational, Scientist, product/process development, or vocational program   Tobacco Use    Smoking status: Never    Smokeless tobacco: Never   Vaping Use    Vaping status: Never Used   Substance and Sexual Activity    Alcohol use: Never    Drug use: Never    Sexual activity: Yes     Partners: Male     Birth control/protection: None     Social Determinants of Health     Financial Resource Strain: Low Risk  (12/29/2022)    Financial Resource Strain     SDOH Financial: No   Transportation Needs: Low Risk  (12/29/2022)    Transportation Needs     SDOH Transportation: No   Social Connections: Low Risk  (12/29/2022)    Social Connections     SDOH Social Isolation: 5 or more times a week   Intimate Partner Violence: Low Risk  (12/29/2022)    Intimate Partner Violence     SDOH Domestic Violence: No   Housing Stability: Low Risk  (12/29/2022)    Housing Stability     SDOH Housing Situation: I have housing.     SDOH Housing Worry: No           Review of Systems:  Any pertinent Review of Systems as addressed in the HPI above.    Physical Exam:  Vital Signs:  Vitals:    09/20/23 1154   BP: 126/80   Pulse: 76   Temp: 37.8 C (100 F)   TempSrc: Tympanic   SpO2: 94%   Weight: 115 kg (253 lb)     Physical Exam  Vitals reviewed.   Constitutional:       General: She is not in acute distress.     Appearance: Normal appearance.   HENT:      Head: Normocephalic and atraumatic.      Right Ear: Tympanic membrane normal. There is no impacted cerumen.      Left Ear: Tympanic membrane normal. There is no impacted cerumen.      Nose: Rhinorrhea present.      Mouth/Throat:      Pharynx: Oropharyngeal exudate and posterior oropharyngeal erythema present.   Eyes:      General: No scleral icterus.        Right eye: No discharge.          Left eye: No discharge.   Neck:      Vascular: No carotid bruit.   Cardiovascular:      Rate and Rhythm: Normal rate and regular rhythm.      Pulses: Normal pulses.      Heart sounds: Normal heart sounds.   Pulmonary:      Effort: Pulmonary effort is normal.      Breath sounds: Normal breath sounds.   Abdominal:      Palpations: Abdomen is soft.      Tenderness: There is no abdominal tenderness.   Musculoskeletal:         General: Tenderness present.      Right lower leg: No edema.      Left lower leg: No edema.  Comments: Paraspinal spasm thoracolumbar region. Decreased extension lumbar spine   Lymphadenopathy:      Cervical: Cervical adenopathy present.   Skin:     General: Skin is warm and dry.      Capillary Refill: Capillary refill takes less than 2 seconds.      Findings: No rash.   Neurological:      General: No focal deficit present.      Mental Status: She is alert and oriented to person, place, and time.                Weyman Pedro, DO     Portions of this note may be dictated using voice recognition software or a dictation service. Variances in spelling and vocabulary are possible and unintentional. Not all errors are caught/corrected. Please notify the Thereasa Parkin if any discrepancies are noted or if the meaning of any statement is not clear.

## 2023-09-21 ENCOUNTER — Ambulatory Visit (HOSPITAL_BASED_OUTPATIENT_CLINIC_OR_DEPARTMENT_OTHER): Payer: Self-pay

## 2023-09-21 ENCOUNTER — Ambulatory Visit (HOSPITAL_COMMUNITY): Payer: Self-pay

## 2023-09-21 ENCOUNTER — Encounter (RURAL_HEALTH_CENTER): Payer: Self-pay | Admitting: Family Medicine

## 2023-09-21 MED ORDER — METOCLOPRAMIDE 5 MG TABLET
5.0000 mg | ORAL_TABLET | Freq: Every day | ORAL | Status: DC
Start: 2023-09-21 — End: 2023-10-24

## 2023-09-23 ENCOUNTER — Other Ambulatory Visit: Payer: MEDICAID

## 2023-09-26 ENCOUNTER — Ambulatory Visit (HOSPITAL_COMMUNITY)
Admission: RE | Admit: 2023-09-26 | Discharge: 2023-09-26 | Disposition: A | Discharge status: Still In Hospital | Payer: MEDICAID | Source: Ambulatory Visit

## 2023-09-26 ENCOUNTER — Other Ambulatory Visit: Payer: Self-pay

## 2023-09-26 NOTE — PT Treatment (Addendum)
[facility] Medicine [facility]  Outpatient Physical Therapy  [address]  [place], [address]  [phone]  (Fax) [phone]    Physical Therapy Treatment Note    Date: 09/26/2023  Patient's Name: Lindsay Crawford  Date of Birth: August 14, 1976  Physical Therapy Visit      Visit #/POC: 3/12  Authorization: 3 out of up 15  POC Signed?: N/A  POC Ends: 10/26/2023  Order Ends: 10/12/2023  Next Progress Note Due: Visit 6 or 30 days     Evaluating Physical Therapist: Ilsa Iha , PT, DPT   PT diagnosis/Reason for Referral: Low back pain  Next Scheduled Physician Appointment: Unknown   Allergies/Contraindications: N/A        Subjective: Patient reports she drove to [facility] and back today which exacerbated her back pain.     Objective: Treatment as follows:        EXERCISE/ACTIVITY NAME REPETITIONS RESISTANCE COMPLETED THIS DOS   Nu-Step X8 min L2 Yes   DKTC over yellow physioball    X1 min, twice      Yes   Lumbothoracic rotations over yellow physioball X65min, twice  Yes   Bridges over yellow physioball  x10  Yes   Clamshells with resistance X10 ea LE, twice GTB Yes   Reciprocal UE/LE reaching in quadruped (piece wise bird dog)    X1 min, twice      No   Prone press-ups x20 AROM Yes   Bird dog X1 min, twice   Yes   Modified plank on knees    X30s, twice      No   Plank on forearms X30s, twice   Yes   HEP Review              Yes      HEP:  Access Code: [email or web address]  URL: [email or web address]  Date: 09/14/2023     Exercises  - Supine Lower Trunk Rotation  - 1 x daily - 5 x weekly - 3 sets - 10 reps  - Supine Single Knee to Chest Stretch  - 1 x daily - 5 x weekly - 3 sets - 10 reps  - Child's Pose Stretch  - 1 x daily - 5 x weekly - 3 sets - 10 reps  - Seated Lumbar Flexion Stretch  - 1 x daily - 5 x weekly - 3 sets - 30s times     Assessment:   Patient with good tolerance of treatment this session, no increased pain or significant fatigue. Improving with lumbar stabilization during quadruped  activities demonstrated by decreased lateral sway. Able to hold plank with proper form for 30s continuously today. Will continue to benefit from PT intervention.       Short Term Goals: 3 Weeks              -Reduce radicular symptoms with job performance (bending, twisting, lifting).  -Increase lumbar FWB AROM by 5 degrees into flexion/extension, lateral flexion, and rotation.  -Improve score on PSFS to at least 4.6 indicating improved ADL capacity and quality of life. (2.6 on 10/16; MDC =2)     Long Term Goals: 6 Weeks  -ROM / Strength WFL to permit work / Copywriter, advertising / household  / recreational ADL's without Compensatory mechanics due to pain or weakness.              -Community ambulation not limited by back pain.              -Improve score on  PSFS to at least 6.6 indicating improved ADL capacity and quality of life. (MDC =2)     Plan:  Continue lumbar stabilization program. Next, add lateral flexion stretch in sitting.    Total Session Time 40 and Timed code minutes 40  THERAPEUTIC EXERCISE 40 minutes      Langford Carias, PT  09/26/2023, 16:18

## 2023-09-27 ENCOUNTER — Encounter (HOSPITAL_COMMUNITY): Payer: Self-pay

## 2023-09-27 ENCOUNTER — Ambulatory Visit (RURAL_HEALTH_CENTER): Payer: Self-pay | Admitting: Family Medicine

## 2023-09-28 ENCOUNTER — Ambulatory Visit
Admission: RE | Admit: 2023-09-28 | Discharge: 2023-09-28 | Disposition: A | Discharge status: Still In Hospital | Payer: MEDICAID | Source: Ambulatory Visit

## 2023-09-28 DIAGNOSIS — M5126 Other intervertebral disc displacement, lumbar region: Secondary | ICD-10-CM

## 2023-09-28 DIAGNOSIS — M62830 Muscle spasm of back: Secondary | ICD-10-CM

## 2023-09-28 NOTE — PT Treatment (Signed)
[facility] Medicine [facility]  Outpatient Physical Therapy  [address]  [place], [address]  [phone]  (Fax) [phone]    Physical Therapy Treatment Note    Date: 09/28/2023  Patient's Name: Lindsay Crawford  Date of Birth: [birth year]  Physical Therapy Visit      Visit #/POC: 4/12  Authorization: 4 out of up 15  POC Signed?: N/A  POC Ends: 10/26/2023  Order Ends: 10/12/2023  Next Progress Note Due: Visit 6 or 30 days     Evaluating Physical Therapist: Ilsa Iha , PT, DPT   PT diagnosis/Reason for Referral: Low back pain  Next Scheduled Physician Appointment: Unknown   Allergies/Contraindications: N/A        Subjective: Patient reports she is having pain shooting (R) leg and up her back. Reports she has also had an increase of pin in her left hip from bursitis. States that her PCP is trying to get a weight loss pill(has to lose 20# to get her back surgery) anda  pain patch approved through her insurance.     Objective: Treatment as follows:        EXERCISE/ACTIVITY NAME REPETITIONS RESISTANCE COMPLETED THIS DOS   Nu-Step X8 min L2 Yes   DKTC over yellow physioball    X1 min, twice      Yes   Lumbothoracic rotations over yellow physioball X31min, twice   Yes   Bridges over yellow physioball  x10   Yes   Clamshells with resistance X10 ea LE, twice GTB Yes   Reciprocal UE/LE reaching in quadruped (piece wise bird dog)    X1 min, twice      No   Prone press-ups x20 AROM n   Bird dog X1 min, twice   Yes   Modified plank on knees    X30s, twice      No   Plank on forearms X30s, twice   Yes   HEP Review              n   Lateral flexion stretch      Side glides  with PB x12  y   Piriformis stretching   Manual y      HEP:  Access Code: [email or web address]       Assessment:  Initiated side glides and lateral flexion with good response. Noted VC with bird dog to prevent compensatory strategies. No adverse effects noted form treatment.        Short Term Goals: 3 Weeks              -Reduce radicular  symptoms with job performance (bending, twisting, lifting).  -Increase lumbar FWB AROM by 5 degrees into flexion/extension, lateral flexion, and rotation.  -Improve score on PSFS to at least 4.6 indicating improved ADL capacity and quality of life. (2.6 on 10/16; MDC =2)     Long Term Goals: 6 Weeks  -ROM / Strength WFL to permit work / Copywriter, advertising / household  / recreational ADL's without Compensatory mechanics due to pain or weakness.              -Community ambulation not limited by back pain.              -Improve score on PSFS to at least 6.6 indicating improved ADL capacity and quality of life. (MDC =2)     Plan:  Continue lumbar stabilization program.          Total Session Time 38 and Timed code minutes 38  THERAPEUTIC EXERCISE 38 minutes      Trilby Leaver, PTA  09/28/2023, 16:12

## 2023-09-29 ENCOUNTER — Other Ambulatory Visit (RURAL_HEALTH_CENTER): Payer: Self-pay | Admitting: Family Medicine

## 2023-09-29 MED ORDER — BUPRENORPHINE 15 MCG/HOUR WEEKLY TRANSDERMAL PATCH
1.0000 | MEDICATED_PATCH | TRANSDERMAL | 5 refills | Status: DC
Start: 2023-09-29 — End: 2023-10-10

## 2023-09-29 MED ORDER — PHENTERMINE 37.5 MG TABLET
37.5000 mg | ORAL_TABLET | Freq: Every morning | ORAL | 5 refills | Status: AC
Start: 2023-09-29 — End: ?

## 2023-09-29 MED ORDER — NALOXONE 4 MG/ACTUATION NASAL SPRAY
1.0000 | NASAL | 1 refills | Status: DC | PRN
Start: 2023-09-29 — End: 2024-08-30

## 2023-09-30 ENCOUNTER — Encounter (INDEPENDENT_AMBULATORY_CARE_PROVIDER_SITE_OTHER): Payer: Self-pay | Admitting: Physician Assistant

## 2023-10-03 ENCOUNTER — Other Ambulatory Visit: Payer: Self-pay

## 2023-10-03 ENCOUNTER — Ambulatory Visit (HOSPITAL_COMMUNITY)
Admission: RE | Admit: 2023-10-03 | Discharge: 2023-10-03 | Disposition: A | Discharge status: Still In Hospital | Payer: MEDICAID | Source: Ambulatory Visit

## 2023-10-03 DIAGNOSIS — G8929 Other chronic pain: Secondary | ICD-10-CM | POA: Insufficient documentation

## 2023-10-03 DIAGNOSIS — M545 Low back pain, unspecified: Secondary | ICD-10-CM | POA: Insufficient documentation

## 2023-10-03 NOTE — PT Treatment (Signed)
[facility] Medicine [facility]  Outpatient Physical Therapy  [address]  [place], [address]  [phone]  (Fax) [phone]    Physical Therapy Treatment Note    Date: 10/03/2023  Patient's Name: Lindsay Crawford  Date of Birth: Sep 20, 1976  Physical Therapy Visit    Visit #/POC: 5/12  Authorization: 5 out of up 15  POC Signed?: N/A  POC Ends: 10/26/2023  Order Ends: 10/12/2023  Next Progress Note Due: Visit 6 or 30 days     Evaluating Physical Therapist: Ilsa Iha , PT, DPT   PT diagnosis/Reason for Referral: Low back pain  Next Scheduled Physician Appointment: Unknown   Allergies/Contraindications: N/A        Subjective: Patient states she has been feeling good today. Denies back pain. Reports she has been helping her dad run errands today. States she received her pain patch today and will apply it tonight.      Objective: Treatment as follows:        EXERCISE/ACTIVITY NAME REPETITIONS RESISTANCE COMPLETED THIS DOS   Nu-Step X8 min L2 Yes   DKTC over yellow physioball    X1 min, twice      Yes   Lumbothoracic rotations over yellow physioball X34min, twice   Yes   Bridges over yellow physioball  X10, twice   Yes   Supine ab bicycles X30s, twice  Yes   Supine SLR flutter kicks X30s, twice  Yes   Trunk extension machine X10, twice #30lb Yes   Clamshells with resistance X10 ea LE, twice GTB No   Reciprocal UE/LE reaching in quadruped (piece wise bird dog)    X1 min, twice      No   Prone press-ups x20 AROM n   Bird dog X1 min, twice   No   Modified plank on knees    X30s, twice      No   Plank on forearms X30s, twice   Yes   HEP Review              n   Lateral flexion stretch     N   Side glides  with PB x12   N   Piriformis stretching    Manual N      HEP:  Access Code: [email or web address]     Assessment:  Progressed lumbar stabilization program with addition of two new core strengthening exercises in supine. Patient with good tolerance of additions, no reports of increased lumbar pain but she did  endorse fatigue. Improving with endurance, less rest breaks required this session. Will continue to benefit from PT intervention.      Short Term Goals: 3 Weeks              -Reduce radicular symptoms with job performance (bending, twisting, lifting).  -Increase lumbar FWB AROM by 5 degrees into flexion/extension, lateral flexion, and rotation.  -Improve score on PSFS to at least 4.6 indicating improved ADL capacity and quality of life. (2.6 on 10/16; MDC =2)     Long Term Goals: 6 Weeks  -ROM / Strength WFL to permit work / Copywriter, advertising / household  / recreational ADL's without Compensatory mechanics due to pain or weakness.              -Community ambulation not limited by back pain.              -Improve score on PSFS to at least 6.6 indicating improved ADL capacity and quality of life. (MDC =2)  Plan:  Continue lumbar stabilization program. Progress check next visit.       Total Session Time 45 and Timed code minutes 45  THERAPEUTIC EXERCISE 45 minutes      Silas Sedam, PT  10/03/2023, 14:52

## 2023-10-04 ENCOUNTER — Other Ambulatory Visit: Payer: Self-pay

## 2023-10-04 ENCOUNTER — Emergency Department
Admission: EM | Admit: 2023-10-04 | Discharge: 2023-10-04 | Disposition: A | Discharge status: Home / Self Care | Payer: MEDICAID | Source: Home / Self Care | Attending: Emergency Medicine | Admitting: Emergency Medicine

## 2023-10-04 ENCOUNTER — Other Ambulatory Visit: Payer: MEDICAID

## 2023-10-04 ENCOUNTER — Encounter (HOSPITAL_BASED_OUTPATIENT_CLINIC_OR_DEPARTMENT_OTHER): Payer: Self-pay

## 2023-10-04 DIAGNOSIS — R110 Nausea: Secondary | ICD-10-CM

## 2023-10-04 DIAGNOSIS — R718 Other abnormality of red blood cells: Secondary | ICD-10-CM | POA: Insufficient documentation

## 2023-10-04 DIAGNOSIS — Z1152 Encounter for screening for COVID-19: Secondary | ICD-10-CM | POA: Insufficient documentation

## 2023-10-04 DIAGNOSIS — K3184 Gastroparesis: Secondary | ICD-10-CM | POA: Insufficient documentation

## 2023-10-04 LAB — COMPREHENSIVE METABOLIC PANEL, NON-FASTING
ALBUMIN/GLOBULIN RATIO: 1.1 (ref 0.8–1.4)
ALBUMIN: 3.9 g/dL (ref 3.4–5.0)
ALKALINE PHOSPHATASE: 83 U/L (ref 46–116)
ALT (SGPT): 36 U/L (ref <=78)
ANION GAP: 8 mmol/L (ref 4–13)
AST (SGOT): 22 U/L (ref 15–37)
BILIRUBIN TOTAL: 0.5 mg/dL (ref 0.2–1.0)
BUN/CREA RATIO: 10
BUN: 10 mg/dL (ref 7–18)
CALCIUM, CORRECTED: 9.2 mg/dL
CALCIUM: 9.1 mg/dL (ref 8.5–10.1)
CHLORIDE: 102 mmol/L (ref 98–107)
CO2 TOTAL: 29 mmol/L (ref 21–32)
CREATININE: 1.01 mg/dL (ref 0.55–1.02)
ESTIMATED GFR: 70 mL/min/{1.73_m2} (ref >59)
GLOBULIN: 3.4
GLUCOSE: 99 mg/dL (ref 74–106)
OSMOLALITY, CALCULATED: 277 mosm/kg (ref 270–290)
POTASSIUM: 4.5 mmol/L (ref 3.5–5.1)
PROTEIN TOTAL: 7.3 g/dL (ref 6.4–8.2)
SODIUM: 139 mmol/L (ref 136–145)

## 2023-10-04 LAB — URINALYSIS, MACRO/MICRO
BILIRUBIN: NEGATIVE mg/dL
BLOOD: NEGATIVE mg/dL
GLUCOSE: NEGATIVE mg/dL
KETONES: NEGATIVE mg/dL
LEUKOCYTES: NEGATIVE WBCs/uL
NITRITE: NEGATIVE
PH: 6.5 (ref 4.6–8.0)
PROTEIN: NEGATIVE mg/dL
SPECIFIC GRAVITY: 1.015 (ref 1.003–1.035)
UROBILINOGEN: 0.2 mg/dL (ref 0.2–1.0)

## 2023-10-04 LAB — CBC WITH DIFF
BASOPHIL #: 0.03 10*3/uL (ref 0.00–0.30)
BASOPHIL %: 0 % (ref 0–3)
EOSINOPHIL #: 0.18 10*3/uL (ref 0.00–0.80)
EOSINOPHIL %: 2 % (ref 1–7)
HCT: 41.5 % (ref 37.0–47.0)
HGB: 14.1 g/dL (ref 12.5–16.0)
LYMPHOCYTE #: 2.34 10*3/uL (ref 1.10–5.00)
LYMPHOCYTE %: 24 % — ABNORMAL LOW (ref 25–45)
MCH: 29.6 pg (ref 27.0–32.0)
MCHC: 34 g/dL (ref 32.0–36.0)
MCV: 87.2 fL (ref 78.0–99.0)
MONOCYTE #: 0.59 10*3/uL (ref 0.00–1.30)
MONOCYTE %: 6 % (ref 0–12)
MPV: 7.7 fL (ref 7.4–10.4)
NEUTROPHIL #: 6.5 10*3/uL (ref 1.80–8.40)
NEUTROPHIL %: 67 % (ref 40–76)
PLATELETS: 269 10*3/uL (ref 140–440)
RBC: 4.76 10*6/uL (ref 4.20–5.40)
RDW: 15.1 % — ABNORMAL HIGH (ref 11.6–14.8)
WBC: 9.6 10*3/uL (ref 4.0–10.5)

## 2023-10-04 LAB — RAPID THROAT SCREEN, STREPTOCOCCUS, WITH REFLEX: THROAT RAPID SCREEN, STREPTOCOCCUS: NEGATIVE

## 2023-10-04 LAB — COVID-19, FLU A/B, RSV RAPID BY PCR
INFLUENZA VIRUS TYPE A: NOT DETECTED
INFLUENZA VIRUS TYPE B: NOT DETECTED
RESPIRATORY SYNCTIAL VIRUS (RSV): NOT DETECTED
SARS-CoV-2: NOT DETECTED

## 2023-10-04 LAB — LACTIC ACID LEVEL W/ REFLEX FOR LEVEL >2.0: LACTIC ACID: 0.8 mmol/L (ref 0.4–2.0)

## 2023-10-04 LAB — LIPASE: LIPASE: 26 U/L (ref 16–77)

## 2023-10-04 MED ORDER — ONDANSETRON 4 MG DISINTEGRATING TABLET
4.0000 mg | ORAL_TABLET | ORAL | Status: AC
Start: 2023-10-04 — End: 2023-10-04
  Administered 2023-10-04: 4 mg via ORAL

## 2023-10-04 MED ORDER — ONDANSETRON 4 MG DISINTEGRATING TABLET
4.0000 mg | ORAL_TABLET | Freq: Three times a day (TID) | ORAL | 0 refills | Status: AC | PRN
Start: 2023-10-04 — End: 2023-10-08

## 2023-10-04 MED ORDER — ONDANSETRON 4 MG DISINTEGRATING TABLET
ORAL_TABLET | ORAL | Status: AC
Start: 2023-10-04 — End: 2023-10-04
  Filled 2023-10-04: qty 1

## 2023-10-04 NOTE — ED Provider Notes (Signed)
Emergency Medicine      Name: Lindsay Crawford  Age and Gender: 47 y.o. female  Date of Birth: December 21, 1975  MRN: [id number]  PCP: Weyman Pedro, DO    CC:  Chief Complaint   Patient presents with    Nausea       HPI:  Lindsay Crawford is a 47 y.o. White female who presents to the ER with low-grade temperature that started today, runny nose and "waves of nausea".    Below pertinent information reviewed with patient:  Past Medical History:   Diagnosis Date    Body mass index 35.0-35.9, adult     Borderline diabetes     Chronic low back pain     Dizziness     DUB (dysfunctional uterine bleeding)     Gastroparesis     GERD (gastroesophageal reflux disease)     Hyperglycemia     Hyperlipidemia LDL goal <70     Insomnia     Knee pain, bilateral     Migraine     Paraspinal muscle spasm     Vitamin D deficiency            Allergies   Allergen Reactions    Other Rash     Allergic to the sun        Past Surgical History:   Procedure Laterality Date    CESAREAN SECTION  2004    COLONOSCOPY      Dr. Lequita Halt in [place]    CYSTOSCOPY; LASER LITHOTRIPSY; RIGHT URETEROSCOPY; RIGHT RETROGRADE PYELOGRAM; STONE BASKETING; RIGHT STENT PLACEMENT Right 05/11/2023    Performed by Theodoro Kalata, DO at [facility]    DIAGNOSTIC LAPAROSCOPY; BILATERAL SALPINGECTOMIES; Bilateral 07/30/2022    Performed by Remonia Richter, DO at [facility]    ENDOMETRIAL ABLATION W/ NOVASURE      dr brodnik 12/14/22    ESOPHAGOSCOPY / EGD      HX APPENDECTOMY      HX BACK SURGERY      november 2019    HX LAP CHOLECYSTECTOMY      HYSTEROSCOPY; DILATION AND CURETTAGE, LAPAROSCOPIC ENTEROLYSIS N/A 07/30/2022    Performed by Remonia Richter, DO at [facility]    HYSTEROSCOPY; DILATION AND CURETTGAE; NOVASURE ENDOMETRIAL ABLATION N/A 12/17/2022    Performed by Remonia Richter, DO at [facility]    LAPAROSCOPIC TUBAL LIGATION  07/30/2022    with [facility] brodmik    NISSEN FUNDOPLICATION  08/13/2022    had in [place]        Social History     Socioeconomic History    Marital status:  Widowed    Number of children: 1    Years of education: 12+    Highest education level: Associate degree: occupational, Scientist, product/process development, or vocational program   Tobacco Use    Smoking status: Never    Smokeless tobacco: Never   Vaping Use    Vaping status: Never Used   Substance and Sexual Activity    Alcohol use: Never    Drug use: Never    Sexual activity: Yes     Partners: Male     Birth control/protection: None     Social Determinants of Health     Financial Resource Strain: Low Risk  (12/29/2022)    Financial Resource Strain     SDOH Financial: No   Transportation Needs: Low Risk  (12/29/2022)    Transportation Needs     SDOH Transportation: No   Social Connections: Low  Risk  (12/29/2022)    Social Connections     SDOH Social Isolation: 5 or more times a week   Intimate Partner Violence: Low Risk  (12/29/2022)    Intimate Partner Violence     SDOH Domestic Violence: No   Housing Stability: Low Risk  (12/29/2022)    Housing Stability     SDOH Housing Situation: I have housing.     SDOH Housing Worry: No       ROS:  No other overt positive review of systems are noted other than stated in the HPI.      Objective:    ED Triage Vitals [10/04/23 1741]   BP (Non-Invasive) (!) 144/94   Heart Rate 75   Respiratory Rate 18   Temperature 36.4 C (97.6 F)   SpO2 98 %   Weight 114 kg (252 lb)   Height 1.727 m (5\' 8" )     Filed Vitals:    10/04/23 1741   BP: (!) 144/94   Pulse: 75   Resp: 18   Temp: 36.4 C (97.6 F)   SpO2: 98%       Nursing notes and vital signs reviewed.    Constitutional - No acute distress.  Alert and Active.  HEENT - Normocephalic. Atraumatic. PERRL. EOMI. Conjunctiva clear. TM's pearly grey, translucent, without bulging or retraction. Oropharynx with no erythema, lesions, or exudates. Moist mucous membranes.   Neck - Trachea midline. No stridor. No hoarseness.  Cardiac - Regular rate and rhythm. No murmurs, rubs, or gallops. Intact distal pulses.  Respiratory/Chest - Normal respiratory effort. Clear to  auscultation bilaterally. No rales, wheezes or rhonchi. No chest tenderness.  Abdomen - Normal bowel sounds. Non-tender, soft, non-distended. No rebound or guarding.   Musculoskeletal - Good AROM. No muscle or joint tenderness appreciated. No clubbing, cyanosis or edema.  Skin - Warm and dry, without any rashes or other lesions.  Neuro - Alert and oriented x 3. Cranial nerves II-XII are grossly intact.  Moving all extremities symmetrically. Normal gait.  Psych - Normal mood and affect. Behavior is normal                  Any pertinent labs and imaging obtained during this encounter reviewed below in MDM.    MDM/ED Course:        Medical Decision Making  Lindsay Crawford is a 47 y.o. White female who presents to the ER with low-grade temperature that started today, runny nose and "waves of nausea".    Patient AAO x3, respirations even and unlabored.  Abdomen soft nondistended with bowel sounds present all 4 quads.  No acute distress appreciated.    Amount and/or Complexity of Data Reviewed  Labs: ordered.  ECG/medicine tests: independent interpretation performed.    Risk  Prescription drug management.              ED Course as of 10/04/23 2122   Tue Oct 04, 2023   2027 CBC/DIFF(!)  RDW elevated 15.1 with lymphocytes below normal at 24   2027 COMPREHENSIVE METABOLIC PANEL, NON-FASTING  No abnormal results   2027 LIPASE  Normal   2027 RAPID THROAT SCREEN, STREPTOCOCCUS, WITH REFLEX  Negative   2117 LACTIC ACID LEVEL W/ REFLEX FOR LEVEL >2.0  Negative   2117 COVID-19, FLU A/B, RSV RAPID BY PCR  Negative   2117 URINALYSIS WITH REFLEX MICROSCOPIC AND CULTURE IF POSITIVE  Negative   2117 Discussed results and plan of care.  Patient indicated and verbalized  understanding and agreement.  Patient to be discharged to home with instructions to follow up primary care provider or return to the emergency department as needed.  Patient requested work excuse will provide.       Orders Placed This Encounter    RAPID THROAT SCREEN,  STREPTOCOCCUS, WITH REFLEX    THROAT CULTURE, BETA HEMOLYTIC STREPTOCOCCUS    CBC/DIFF    COMPREHENSIVE METABOLIC PANEL, NON-FASTING    LIPASE    LACTIC ACID LEVEL W/ REFLEX FOR LEVEL >2.0    URINALYSIS WITH REFLEX MICROSCOPIC AND CULTURE IF POSITIVE    CBC WITH DIFF    URINALYSIS, MACRO/MICRO    COVID-19, FLU A/B, RSV RAPID BY PCR    ondansetron (ZOFRAN ODT) rapid dissolve tablet    ondansetron (ZOFRAN ODT) 4 mg Oral Tablet, Rapid Dissolve         Any procedures:      Impression:   Clinical Impression   Nausea (Primary)       Disposition: Discharged    / Corinna Lines, FNP-BC  10/04/2023, 19:22  [facility]  Department of Emergency Medicine  [facility]    Portions of this note may have been dictated using voice recognition software.     -----------------------  Results for orders placed or performed during the hospital encounter of 10/04/23 (from the past 12 hour(s))   COMPREHENSIVE METABOLIC PANEL, NON-FASTING   Result Value Ref Range    SODIUM 139 136 - 145 mmol/L    POTASSIUM 4.5 3.5 - 5.1 mmol/L    CHLORIDE 102 98 - 107 mmol/L    CO2 TOTAL 29 21 - 32 mmol/L    ANION GAP 8 4 - 13 mmol/L    BUN 10 7 - 18 mg/dL    CREATININE 1.61 0.96 - 1.02 mg/dL    BUN/CREA RATIO 10     ESTIMATED GFR 70 >59 mL/min/1.38m^2    ALBUMIN 3.9 3.4 - 5.0 g/dL    CALCIUM 9.1 8.5 - 04.5 mg/dL    GLUCOSE 99 74 - 409 mg/dL    ALKALINE PHOSPHATASE 83 46 - 116 U/L    ALT (SGPT) 36 <=78 U/L    AST (SGOT) 22 15 - 37 U/L    BILIRUBIN TOTAL 0.5 0.2 - 1.0 mg/dL    PROTEIN TOTAL 7.3 6.4 - 8.2 g/dL    ALBUMIN/GLOBULIN RATIO 1.1 0.8 - 1.4    OSMOLALITY, CALCULATED 277 270 - 290 mOsm/kg    CALCIUM, CORRECTED 9.2 mg/dL    GLOBULIN 3.4    LIPASE   Result Value Ref Range    LIPASE 26 16 - 77 U/L   LACTIC ACID LEVEL W/ REFLEX FOR LEVEL >2.0   Result Value Ref Range    LACTIC ACID 0.8 0.4 - 2.0 mmol/L   CBC WITH DIFF   Result Value Ref Range    WBC 9.6 4.0 - 10.5 x10^3/uL    RBC 4.76 4.20 - 5.40 x10^6/uL    HGB 14.1 12.5 -  16.0 g/dL    HCT 81.1 91.4 - 78.2 %    MCV 87.2 78.0 - 99.0 fL    MCH 29.6 27.0 - 32.0 pg    MCHC 34.0 32.0 - 36.0 g/dL    RDW 95.6 (H) 21.3 - 14.8 %    PLATELETS 269 140 - 440 x10^3/uL    MPV 7.7 7.4 - 10.4 fL    NEUTROPHIL % 67 40 - 76 %    LYMPHOCYTE % 24 (L) 25 - 45 %  MONOCYTE % 6 0 - 12 %    EOSINOPHIL % 2 1 - 7 %    BASOPHIL % 0 0 - 3 %    NEUTROPHIL # 6.50 1.80 - 8.40 x10^3/uL    LYMPHOCYTE # 2.34 1.10 - 5.00 x10^3/uL    MONOCYTE # 0.59 0.00 - 1.30 x10^3/uL    EOSINOPHIL # 0.18 0.00 - 0.80 x10^3/uL    BASOPHIL # 0.03 0.00 - 0.30 x10^3/uL   COVID-19, FLU A/B, RSV RAPID BY PCR   Result Value Ref Range    SARS-CoV-2 Not Detected Not Detected    INFLUENZA VIRUS TYPE A Not Detected Not Detected    INFLUENZA VIRUS TYPE B Not Detected Not Detected    RESPIRATORY SYNCTIAL VIRUS (RSV) Not Detected Not Detected   RAPID THROAT SCREEN, STREPTOCOCCUS, WITH REFLEX    Specimen: Throat; Swab   Result Value Ref Range    THROAT RAPID SCREEN, STREPTOCOCCUS Negative Negative   URINALYSIS, MACRO/MICRO   Result Value Ref Range    COLOR Light Yellow Light Yellow, Yellow    APPEARANCE Clear Clear    SPECIFIC GRAVITY 1.015 1.003 - 1.035    PH 6.5 4.6 - 8.0    LEUKOCYTES Negative Negative WBCs/uL    NITRITE Negative Negative    PROTEIN Negative Negative mg/dL    GLUCOSE Negative Negative mg/dL    KETONES Negative Negative mg/dL    BILIRUBIN Negative Negative mg/dL    BLOOD Negative Negative mg/dL    UROBILINOGEN 0.2 0.2 - 1.0 mg/dL     No orders to display

## 2023-10-04 NOTE — ED Nurses Note (Signed)
Verbalized understanding of discharge instructions, rx education, and follow up information. AVS given to patient.  VSS.  Left Er with no further complaints.

## 2023-10-04 NOTE — ED Triage Notes (Addendum)
Complains of nausea and fever onset today.   States has lower back pain but has chronic back pain from L4 and L5 compression.   Rx Buprenorphine patch 15 mcg application to back started yesterday (7 day patch) at 1900.   Denies urinary complaints. Denies cough or  sore throat. But has a runny nose.Denies chills or bodyaches.

## 2023-10-05 ENCOUNTER — Other Ambulatory Visit: Payer: Self-pay

## 2023-10-05 ENCOUNTER — Emergency Department
Admission: EM | Admit: 2023-10-05 | Discharge: 2023-10-06 | Disposition: A | Discharge status: Home / Self Care | Payer: MEDICAID | Attending: Emergency Medicine | Admitting: Emergency Medicine

## 2023-10-05 ENCOUNTER — Encounter (HOSPITAL_BASED_OUTPATIENT_CLINIC_OR_DEPARTMENT_OTHER): Payer: Self-pay

## 2023-10-05 ENCOUNTER — Ambulatory Visit (HOSPITAL_COMMUNITY): Payer: Self-pay

## 2023-10-05 ENCOUNTER — Emergency Department (HOSPITAL_BASED_OUTPATIENT_CLINIC_OR_DEPARTMENT_OTHER): Payer: MEDICAID

## 2023-10-05 DIAGNOSIS — R110 Nausea: Secondary | ICD-10-CM | POA: Insufficient documentation

## 2023-10-05 DIAGNOSIS — K219 Gastro-esophageal reflux disease without esophagitis: Secondary | ICD-10-CM | POA: Insufficient documentation

## 2023-10-05 DIAGNOSIS — R1013 Epigastric pain: Secondary | ICD-10-CM

## 2023-10-05 LAB — CBC WITH DIFF
BASOPHIL #: 0.02 10*3/uL (ref 0.00–0.30)
BASOPHIL %: 0 % (ref 0–3)
EOSINOPHIL #: 0.1 10*3/uL (ref 0.00–0.80)
EOSINOPHIL %: 1 % (ref 1–7)
HCT: 43.6 % (ref 37.0–47.0)
HGB: 14.7 g/dL (ref 12.5–16.0)
LYMPHOCYTE #: 2.01 10*3/uL (ref 1.10–5.00)
LYMPHOCYTE %: 21 % — ABNORMAL LOW (ref 25–45)
MCH: 29.4 pg (ref 27.0–32.0)
MCHC: 33.7 g/dL (ref 32.0–36.0)
MCV: 87 fL (ref 78.0–99.0)
MONOCYTE #: 0.49 10*3/uL (ref 0.00–1.30)
MONOCYTE %: 5 % (ref 0–12)
MPV: 7.8 fL (ref 7.4–10.4)
NEUTROPHIL #: 6.95 10*3/uL (ref 1.80–8.40)
NEUTROPHIL %: 73 % (ref 40–76)
PLATELETS: 291 10*3/uL (ref 140–440)
RBC: 5.01 10*6/uL (ref 4.20–5.40)
RDW: 15.1 % — ABNORMAL HIGH (ref 11.6–14.8)
WBC: 9.6 10*3/uL (ref 4.0–10.5)

## 2023-10-05 LAB — COMPREHENSIVE METABOLIC PANEL, NON-FASTING
ALBUMIN/GLOBULIN RATIO: 1.1 (ref 0.8–1.4)
ALBUMIN: 4.1 g/dL (ref 3.4–5.0)
ALKALINE PHOSPHATASE: 87 U/L (ref 46–116)
ALT (SGPT): 35 U/L (ref <=78)
ANION GAP: 15 mmol/L — ABNORMAL HIGH (ref 4–13)
AST (SGOT): 31 U/L (ref 15–37)
BILIRUBIN TOTAL: 0.8 mg/dL (ref 0.2–1.0)
BUN/CREA RATIO: 12
BUN: 12 mg/dL (ref 7–18)
CALCIUM, CORRECTED: 9.8 mg/dL
CALCIUM: 9.9 mg/dL (ref 8.5–10.1)
CHLORIDE: 102 mmol/L (ref 98–107)
CO2 TOTAL: 23 mmol/L (ref 21–32)
CREATININE: 1.03 mg/dL — ABNORMAL HIGH (ref 0.55–1.02)
ESTIMATED GFR: 68 mL/min/{1.73_m2} (ref >59)
GLOBULIN: 3.7
GLUCOSE: 132 mg/dL — ABNORMAL HIGH (ref 74–106)
OSMOLALITY, CALCULATED: 281 mosm/kg (ref 270–290)
POTASSIUM: 3.9 mmol/L (ref 3.5–5.1)
PROTEIN TOTAL: 7.8 g/dL (ref 6.4–8.2)
SODIUM: 140 mmol/L (ref 136–145)

## 2023-10-05 MED ORDER — LACTATED RINGERS IV BOLUS
1000.0000 mL | INJECTION | Status: AC
Start: 2023-10-05 — End: 2023-10-05
  Administered 2023-10-05: 1000 mL via INTRAVENOUS
  Administered 2023-10-05: 0 mL via INTRAVENOUS

## 2023-10-05 MED ORDER — PROCHLORPERAZINE MALEATE 10 MG TABLET
10.0000 mg | ORAL_TABLET | Freq: Three times a day (TID) | ORAL | 0 refills | Status: AC | PRN
Start: 2023-10-05 — End: 2023-10-08

## 2023-10-05 MED ORDER — PANTOPRAZOLE 40 MG INTRAVENOUS SOLUTION
40.0000 mg | INTRAVENOUS | Status: AC
Start: 2023-10-05 — End: 2023-10-05
  Administered 2023-10-05: 40 mg via INTRAVENOUS

## 2023-10-05 MED ORDER — PANTOPRAZOLE 40 MG INTRAVENOUS SOLUTION
INTRAVENOUS | Status: AC
Start: 2023-10-05 — End: 2023-10-05
  Filled 2023-10-05: qty 10

## 2023-10-05 MED ORDER — METOCLOPRAMIDE 5 MG/ML INJECTION SOLUTION
10.0000 mg | INTRAMUSCULAR | Status: AC
Start: 2023-10-05 — End: 2023-10-05
  Administered 2023-10-05: 10 mg via INTRAVENOUS

## 2023-10-05 MED ORDER — METOCLOPRAMIDE 5 MG/ML INJECTION SOLUTION
INTRAMUSCULAR | Status: AC
Start: 2023-10-05 — End: 2023-10-05
  Filled 2023-10-05: qty 2

## 2023-10-05 NOTE — Discharge Instructions (Signed)
FOLLOW-UP WITH GASTROENTEROLOGY IF SYMPTOMS PERSIST

## 2023-10-05 NOTE — ED Provider Notes (Addendum)
[facility] emergency department         HISTORY OF PRESENT ILLNESS     Date:  10/05/2023  Patient's Name:  Lindsay Crawford  Date of Birth:  Feb 11, 1976    PATIENT PRESENTS WITH NAUSEA EPIGASTRIC DISCOMFORT.  PATIENT WAS SEEN SIMILAR COMPLAINT YESTERDAY.  PATIENT STATES ZOFRAN IS NOT HELPING.  THERE HAS BEEN NO VOMITING.  PATIENT HAS HAD NO FEVER OR CHILLS THERE HAS BEEN NO DIARRHEA.  PATIENT HAS HISTORY OF CHOLECYSTECTOMY AND APPENDECTOMY.        Review of Systems     Review of Systems   Constitutional: Negative.    HENT: Negative.     Gastrointestinal:  Positive for abdominal distention and nausea.   Genitourinary: Negative.    Musculoskeletal: Negative.    All other systems reviewed and are negative.      Previous History     Past Medical History:  Past Medical History:   Diagnosis Date   . Body mass index 35.0-35.9, adult    . Borderline diabetes    . Chronic low back pain    . Dizziness    . DUB (dysfunctional uterine bleeding)    . Gastroparesis    . GERD (gastroesophageal reflux disease)    . Hyperglycemia    . Hyperlipidemia LDL goal <70    . Insomnia    . Knee pain, bilateral    . Migraine    . Paraspinal muscle spasm    . Vitamin D deficiency        Past Surgical History:  Past Surgical History:   Procedure Laterality Date   . Cesarean section  2004   . Colonoscopy     . Endometrial ablation w/ novasure     . Esophagoscopy / egd     . Hx appendectomy     . Hx back surgery     . Hx lap cholecystectomy     . Laparoscopic tubal ligation  07/30/2022   . Nissen fundoplication  08/13/2022       Social History:  Social History     Tobacco Use   . Smoking status: Never   . Smokeless tobacco: Never   Vaping Use   . Vaping status: Never Used   Substance Use Topics   . Alcohol use: Never   . Drug use: Never     Social History     Substance and Sexual Activity   Drug Use Never       Family History:  Family History   Problem Relation Age of Onset   . Hypertension (High Blood  Pressure) Mother    . COPD Father    . Diabetes type II Father    . Hypertension (High Blood Pressure) Father    . Kidney Disease Father    . Throat cancer Sister    . Coronary Artery Disease Brother    . No Known Problems Half-Sister    . No Known Problems Half-Brother    . No Known Problems Maternal Aunt    . No Known Problems Maternal Uncle    . No Known Problems Paternal Aunt    . No Known Problems Paternal Uncle    . No Known Problems Maternal Grandmother    . No Known Problems Maternal Grandfather    . No Known Problems Paternal Grandmother    . No Known Problems Paternal Grandfather    . No Known Problems Daughter    . No Known Problems  Son        Medication History:  Current Outpatient Medications   Medication Sig   . albuterol sulfate (PROVENTIL OR VENTOLIN OR PROAIR) 90 mcg/actuation Inhalation oral inhaler Take 2 Puffs by inhalation Every 6 hours as needed   . bethanechol chloride (URECHOLINE) 10 mg Oral Tablet Take 1 Tablet (10 mg total) by mouth Twice daily   . Biotin 10 mg Oral Tablet Take 1 Tablet (10 mg total) by mouth Once a day   . buprenorphine (BUTRANS) 15 mcg/hour Transdermal Patch Weekly Place 1 Patch on the skin Every 7 days Indications: severe chronic pain with opioid tolerance   . cetirizine (ZYRTEC) 10 mg Oral Tablet Take 1 Tablet (10 mg total) by mouth Once a day   . cholecalciferol, vitamin D3, 1,250 mcg (50,000 unit) Oral Capsule Take 1 Capsule (50,000 Units total) by mouth Every 7 days   . Colestipol (COLESTID) 1 gram Oral Tablet Take 2 Tablets (2 g total) by mouth Once a day Takes two in am   . cyclobenzaprine (FLEXERIL) 10 mg Oral Tablet Take 1 Tablet (10 mg total) by mouth Twice daily   . famotidine (PEPCID) 40 mg Oral Tablet Take 1 Tablet (40 mg total) by mouth Every evening   . linaCLOtide (LINZESS) 145 mcg Oral Capsule Take 1 Capsule (145 mcg total) by mouth Every morning   . metoclopramide HCl (REGLAN) 5 mg Oral Tablet Take 1 Tablet (5 mg total) by mouth Once a day Patient taking  twice a day   . naloxone (NARCAN) 4 mg per spray nasal spray 1 Spray by INTRANASAL route Every 2 minutes as needed for actual or suspected opioid overdose. Call 911 if used.   Marland Kitchen omeprazole (PRILOSEC) 40 mg Oral Capsule, Delayed Release(E.C.) Take 1 Capsule (40 mg total) by mouth Twice daily   . ondansetron (ZOFRAN ODT) 4 mg Oral Tablet, Rapid Dissolve Take 1 Tablet (4 mg total) by mouth Every 8 hours as needed for Nausea/Vomiting   . ondansetron (ZOFRAN ODT) 4 mg Oral Tablet, Rapid Dissolve Take 1 Tablet (4 mg total) by mouth Every 8 hours as needed for Nausea/Vomiting for up to 4 days   . phentermine (ADIPEX-P) 37.5 mg Oral Tablet Take 1 Tablet (37.5 mg total) by mouth Every morning before breakfast   . pregabalin (LYRICA) 200 mg Oral Capsule Take 1 Capsule (200 mg total) by mouth Three times a day   . prochlorperazine (COMPAZINE) 10 mg Oral Tablet Take 1 Tablet (10 mg total) by mouth Three times a day as needed for Nausea/Vomiting for up to 3 days   . QUEtiapine (SEROQUEL) 100 mg Oral Tablet Take 1 Tablet (100 mg total) by mouth Twice daily   . rosuvastatin (CRESTOR) 5 mg Oral Tablet Take 1 Tablet (5 mg total) by mouth Once a day       Allergies:  Allergies   Allergen Reactions   . Other Rash     Allergic to the sun        Physical Exam     Vitals:    BP 134/82   Pulse 79   Temp 36.5 C (97.7 F)   Resp 17   Ht 1.727 m (5\' 8" )   Wt 112 kg (248 lb)   SpO2 100%   BMI 37.71 kg/m           Physical Exam  Vitals and nursing note reviewed.   Constitutional:       General: She is not in acute distress.  Appearance: She is well-developed.   HENT:      Head: Normocephalic and atraumatic.      Right Ear: Tympanic membrane normal.      Left Ear: Tympanic membrane normal.      Nose: Nose normal.      Mouth/Throat:      Mouth: Mucous membranes are moist.   Eyes:      Conjunctiva/sclera: Conjunctivae normal.      Pupils: Pupils are equal, round, and reactive to light.   Cardiovascular:      Rate and Rhythm: Normal  rate and regular rhythm.      Heart sounds: No murmur heard.  Pulmonary:      Effort: Pulmonary effort is normal. No respiratory distress.      Breath sounds: Normal breath sounds.   Abdominal:      General: Bowel sounds are normal.      Palpations: Abdomen is soft.      Tenderness: There is no abdominal tenderness.   Musculoskeletal:         General: No swelling. Normal range of motion.      Cervical back: Normal range of motion and neck supple.   Skin:     General: Skin is warm and dry.      Capillary Refill: Capillary refill takes less than 2 seconds.   Neurological:      General: No focal deficit present.      Mental Status: She is alert and oriented to person, place, and time.   Psychiatric:         Mood and Affect: Mood normal.         Diagnostic Studies/Treatment     Medications:  Medications Administered in the ED   LR bolus infusion 1,000 mL (1,000 mL Intravenous New Bag/New Syringe 10/05/23 2240)   pantoprazole (PROTONIX) 4 mg/mL injection (40 mg Intravenous Given 10/05/23 2241)   metoclopramide (REGLAN) 5 mg/mL injection (10 mg Intravenous Given 10/05/23 2243)       New Prescriptions    PROCHLORPERAZINE (COMPAZINE) 10 MG ORAL TABLET    Take 1 Tablet (10 mg total) by mouth Three times a day as needed for Nausea/Vomiting for up to 3 days       Labs:    Results for orders placed or performed during the hospital encounter of 10/05/23 (from the past 12 hour(s))   COMPREHENSIVE METABOLIC PANEL, NON-FASTING   Result Value Ref Range    SODIUM 140 136 - 145 mmol/L    POTASSIUM 3.9 3.5 - 5.1 mmol/L    CHLORIDE 102 98 - 107 mmol/L    CO2 TOTAL 23 21 - 32 mmol/L    ANION GAP 15 (H) 4 - 13 mmol/L    BUN 12 7 - 18 mg/dL    CREATININE 4.78 (H) 0.55 - 1.02 mg/dL    BUN/CREA RATIO 12     ESTIMATED GFR 68 >59 mL/min/1.36m^2    ALBUMIN 4.1 3.4 - 5.0 g/dL    CALCIUM 9.9 8.5 - 29.5 mg/dL    GLUCOSE 621 (H) 74 - 106 mg/dL    ALKALINE PHOSPHATASE 87 46 - 116 U/L    ALT (SGPT) 35 <=78 U/L    AST (SGOT) 31 15 - 37 U/L    BILIRUBIN  TOTAL 0.8 0.2 - 1.0 mg/dL    PROTEIN TOTAL 7.8 6.4 - 8.2 g/dL    ALBUMIN/GLOBULIN RATIO 1.1 0.8 - 1.4    OSMOLALITY, CALCULATED 281 270 - 290 mOsm/kg    CALCIUM, CORRECTED  9.8 mg/dL    GLOBULIN 3.7    CBC WITH DIFF   Result Value Ref Range    WBC 9.6 4.0 - 10.5 x10^3/uL    RBC 5.01 4.20 - 5.40 x10^6/uL    HGB 14.7 12.5 - 16.0 g/dL    HCT 14.7 82.9 - 56.2 %    MCV 87.0 78.0 - 99.0 fL    MCH 29.4 27.0 - 32.0 pg    MCHC 33.7 32.0 - 36.0 g/dL    RDW 13.0 (H) 86.5 - 14.8 %    PLATELETS 291 140 - 440 x10^3/uL    MPV 7.8 7.4 - 10.4 fL    NEUTROPHIL % 73 40 - 76 %    LYMPHOCYTE % 21 (L) 25 - 45 %    MONOCYTE % 5 0 - 12 %    EOSINOPHIL % 1 1 - 7 %    BASOPHIL % 0 0 - 3 %    NEUTROPHIL # 6.95 1.80 - 8.40 x10^3/uL    LYMPHOCYTE # 2.01 1.10 - 5.00 x10^3/uL    MONOCYTE # 0.49 0.00 - 1.30 x10^3/uL    EOSINOPHIL # 0.10 0.00 - 0.80 x10^3/uL    BASOPHIL # 0.02 0.00 - 0.30 x10^3/uL        Radiology:  CT ABDOMEN PELVIS WO IV CONTRAST    CT ABDOMEN PELVIS WO IV CONTRAST   Final Result   NO ACUTE FINDINGS AT THE ABDOMEN OR PELVIS ON NONCONTRAST CT.          One or more dose reduction techniques were used (e.g., Automated exposure control, adjustment of the mA and/or kV according to patient size, use of iterative reconstruction technique).         Radiologist location ID: [id number]             ECG:  NONE            Differential diagnosis  NAUSEA, GASTROESOPHAGEAL REFLUX, PEPTIC ULCERS, GASTROENTERITIS    Course/Disposition/Plan     Course:     ED Course as of 10/05/23 2328   Wed Oct 05, 2023   2327 PATIENT WITH NEGATIVE CT ABDOMEN PELVIS NORMAL LFTS PATIENT WILL BE DISCHARGED HOME ON COMPAZINE    PATIENT WAS SEEN YESTERDAY FOR THE SAME COMPLAINT.  PATIENT WILL BE STARTED ON IV FLUIDS IV REGLAN PROTONIX.  WILL OBTAIN LIVER FUNCTION STUDIES AND CT ABDOMEN PELVIS  Disposition:    Discharged    Condition at Disposition:     STABLE  Follow up:   Weyman Pedro, [place]  [address]  [place] [address]  [phone]    Schedule an  appointment as soon as possible for a visit in 2 days  If symptoms worsen      Clinical Impression:     Clinical Impression   Nausea (Primary)   Gastroesophageal reflux disease, unspecified whether esophagitis present         Tivis Ringer, MD

## 2023-10-05 NOTE — ED Triage Notes (Signed)
Pt was seen here in ED yesterday for similar sx.  States she was dx with stomach virus yesterday. States she is nauseous but unable to vomit

## 2023-10-06 NOTE — ED Nurses Note (Signed)
Patient discharged home.  AVS reviewed with patient/care giver.  A written copy of the AVS and discharge instructions was given to the patient/care giver. Scripts handed to patient/care giver. Questions sufficiently answered as needed.  Patient/care giver encouraged to follow up with PCP as indicated.  In the event of an emergency, patient/care giver instructed to call 911 or go to the nearest emergency room.

## 2023-10-07 ENCOUNTER — Other Ambulatory Visit: Payer: Self-pay

## 2023-10-07 ENCOUNTER — Emergency Department (HOSPITAL_BASED_OUTPATIENT_CLINIC_OR_DEPARTMENT_OTHER): Payer: MEDICAID

## 2023-10-07 ENCOUNTER — Encounter (HOSPITAL_BASED_OUTPATIENT_CLINIC_OR_DEPARTMENT_OTHER): Payer: Self-pay

## 2023-10-07 ENCOUNTER — Emergency Department
Admission: EM | Admit: 2023-10-07 | Discharge: 2023-10-07 | Disposition: A | Discharge status: Home / Self Care | Payer: MEDICAID | Attending: Emergency Medicine | Admitting: Emergency Medicine

## 2023-10-07 DIAGNOSIS — N390 Urinary tract infection, site not specified: Secondary | ICD-10-CM | POA: Insufficient documentation

## 2023-10-07 DIAGNOSIS — Z6836 Body mass index (BMI) 36.0-36.9, adult: Secondary | ICD-10-CM

## 2023-10-07 DIAGNOSIS — F5089 Other specified eating disorder: Secondary | ICD-10-CM

## 2023-10-07 DIAGNOSIS — R110 Nausea: Secondary | ICD-10-CM | POA: Insufficient documentation

## 2023-10-07 LAB — COMPREHENSIVE METABOLIC PANEL, NON-FASTING
ALBUMIN/GLOBULIN RATIO: 1.1 (ref 0.8–1.4)
ALBUMIN: 4 g/dL (ref 3.4–5.0)
ALKALINE PHOSPHATASE: 83 U/L (ref 46–116)
ALT (SGPT): 36 U/L (ref <=78)
ANION GAP: 11 mmol/L (ref 4–13)
AST (SGOT): 24 U/L (ref 15–37)
BILIRUBIN TOTAL: 0.8 mg/dL (ref 0.2–1.0)
BUN/CREA RATIO: 11
BUN: 12 mg/dL (ref 7–18)
CALCIUM, CORRECTED: 9 mg/dL
CALCIUM: 9 mg/dL (ref 8.5–10.1)
CHLORIDE: 103 mmol/L (ref 98–107)
CO2 TOTAL: 23 mmol/L (ref 21–32)
CREATININE: 1.06 mg/dL — ABNORMAL HIGH (ref 0.55–1.02)
ESTIMATED GFR: 66 mL/min/{1.73_m2} (ref >59)
GLOBULIN: 3.7
GLUCOSE: 102 mg/dL (ref 74–106)
OSMOLALITY, CALCULATED: 274 mosm/kg (ref 270–290)
POTASSIUM: 3.6 mmol/L (ref 3.5–5.1)
PROTEIN TOTAL: 7.7 g/dL (ref 6.4–8.2)
SODIUM: 137 mmol/L (ref 136–145)

## 2023-10-07 LAB — CBC WITH DIFF
BASOPHIL #: 0.02 x10ˆ3/uL (ref 0.00–0.30)
BASOPHIL %: 0 % (ref 0–3)
EOSINOPHIL #: 0.09 x10ˆ3/uL (ref 0.00–0.80)
EOSINOPHIL %: 1 % (ref 1–7)
HCT: 43.9 % (ref 37.0–47.0)
HGB: 15 g/dL (ref 12.5–16.0)
LYMPHOCYTE #: 2.35 x10ˆ3/uL (ref 1.10–5.00)
LYMPHOCYTE %: 23 % — ABNORMAL LOW (ref 25–45)
MCH: 29.6 pg (ref 27.0–32.0)
MCHC: 34.2 g/dL (ref 32.0–36.0)
MCV: 86.5 fL (ref 78.0–99.0)
MONOCYTE #: 0.66 x10ˆ3/uL (ref 0.00–1.30)
MONOCYTE %: 7 % (ref 0–12)
MPV: 7.4 fL (ref 7.4–10.4)
NEUTROPHIL #: 6.91 x10ˆ3/uL (ref 1.80–8.40)
NEUTROPHIL %: 69 % (ref 40–76)
PLATELETS: 289 x10ˆ3/uL (ref 140–440)
RBC: 5.07 x10ˆ6/uL (ref 4.20–5.40)
RDW: 14.4 % (ref 11.6–14.8)
WBC: 10 x10ˆ3/uL (ref 4.0–10.5)

## 2023-10-07 LAB — URINALYSIS, MACRO/MICRO
BLOOD: NEGATIVE mg/dL
GLUCOSE: NEGATIVE mg/dL
KETONES: 40 mg/dL — AB
NITRITE: NEGATIVE
PH: 6.5 (ref 4.6–8.0)
PROTEIN: NEGATIVE mg/dL
SPECIFIC GRAVITY: 1.015 (ref 1.003–1.035)
UROBILINOGEN: 1 mg/dL (ref 0.2–1.0)

## 2023-10-07 LAB — URINALYSIS, MICROSCOPIC

## 2023-10-07 LAB — THYROID STIMULATING HORMONE (SENSITIVE TSH): TSH: 2.314 u[IU]/mL (ref 0.358–3.740)

## 2023-10-07 LAB — THROAT CULTURE, BETA HEMOLYTIC STREPTOCOCCUS: THROAT CULTURE: NORMAL

## 2023-10-07 LAB — MAGNESIUM: MAGNESIUM: 1.9 mg/dL (ref 1.8–2.4)

## 2023-10-07 MED ORDER — IOHEXOL 350 MG IODINE/ML INTRAVENOUS SOLUTION
100.0000 mL | INTRAVENOUS | Status: AC
Start: 2023-10-07 — End: 2023-10-07
  Administered 2023-10-07: 100 mL via INTRAVENOUS

## 2023-10-07 MED ORDER — PANTOPRAZOLE 40 MG INTRAVENOUS SOLUTION
INTRAVENOUS | Status: AC
Start: 2023-10-07 — End: 2023-10-07
  Filled 2023-10-07: qty 10

## 2023-10-07 MED ORDER — CEFTRIAXONE 1 GRAM SOLUTION FOR INJECTION
INTRAMUSCULAR | Status: AC
Start: 2023-10-07 — End: 2023-10-07
  Filled 2023-10-07: qty 10

## 2023-10-07 MED ORDER — NITROFURANTOIN MONOHYDRATE/MACROCRYSTALS 100 MG CAPSULE
100.0000 mg | ORAL_CAPSULE | Freq: Two times a day (BID) | ORAL | 0 refills | Status: DC
Start: 2023-10-07 — End: 2023-10-24

## 2023-10-07 MED ORDER — SODIUM CHLORIDE 0.9 % INTRAVENOUS PIGGYBACK
2.0000 g | INTRAVENOUS | Status: AC
Start: 2023-10-07 — End: 2023-10-07
  Administered 2023-10-07: 2 g via INTRAVENOUS
  Administered 2023-10-07: 0 g via INTRAVENOUS

## 2023-10-07 MED ORDER — PROCHLORPERAZINE EDISYLATE 10 MG/2 ML (5 MG/ML) INJECTION SOLUTION
INTRAMUSCULAR | Status: AC
Start: 2023-10-07 — End: 2023-10-07
  Filled 2023-10-07: qty 2

## 2023-10-07 MED ORDER — LACTATED RINGERS IV BOLUS
1000.0000 mL | INJECTION | Status: AC
Start: 2023-10-07 — End: 2023-10-07
  Administered 2023-10-07: 1000 mL via INTRAVENOUS
  Administered 2023-10-07: 0 mL via INTRAVENOUS

## 2023-10-07 MED ORDER — DIATRIZOATE MEGLUMINE-DIATRIZOATE SODIUM 66 %-10 % ORAL SOLUTION
30.0000 mL | ORAL | Status: AC
Start: 2023-10-07 — End: 2023-10-07
  Administered 2023-10-07: 30 mL via ORAL

## 2023-10-07 MED ORDER — PANTOPRAZOLE 40 MG INTRAVENOUS SOLUTION
40.0000 mg | INTRAVENOUS | Status: AC
Start: 2023-10-07 — End: 2023-10-07
  Administered 2023-10-07: 40 mg via INTRAVENOUS

## 2023-10-07 MED ORDER — SODIUM CHLORIDE 0.9 % INTRAVENOUS PIGGYBACK
INJECTION | INTRAVENOUS | Status: AC
Start: 2023-10-07 — End: 2023-10-07
  Filled 2023-10-07: qty 50

## 2023-10-07 MED ORDER — PROCHLORPERAZINE EDISYLATE 10 MG/2 ML (5 MG/ML) INJECTION SOLUTION
10.0000 mg | INTRAMUSCULAR | Status: AC
Start: 2023-10-07 — End: 2023-10-07
  Administered 2023-10-07: 10 mg via INTRAVENOUS

## 2023-10-07 NOTE — Discharge Instructions (Signed)
Please follow-up with primary physician and GI physician as planned

## 2023-10-07 NOTE — ED Triage Notes (Signed)
Pt reports nausea x 4 days. States was seen here x 2 this week and Dx with Stomach Virus but no relief even with Zofran. Pt states unable to eat or drink

## 2023-10-07 NOTE — ED Provider Notes (Addendum)
[facility] emergency department         HISTORY OF PRESENT ILLNESS     Date:  10/07/2023  Patient's Name:  SHARYN SHAY  Date of Birth:  [birth year]    PATIENT IS A 47-YEAR-OLD FEMALE PRESENTING WITH NAUSEA LOSS OF APPETITE UNABLE TO EAT FOR THE PAST 4 DAYS.  PATIENT DENIES ANY DIARRHEA DENIES ANY VOMITING.  PATIENT HAD A NEGATIVE CT ABDOMEN PELVIS WITHOUT CONTRAST.  SHE RETURNS TO THE ER STILL COMPLAINING DESPITE USING ZOFRAN AND COMPAZINE        Review of Systems     Review of Systems   Constitutional: Negative.    HENT: Negative.     Eyes: Negative.    Respiratory: Negative.     Cardiovascular: Negative.    Gastrointestinal:  Positive for nausea.   Genitourinary: Negative.    Musculoskeletal: Negative.    Neurological: Negative.    Psychiatric/Behavioral: Negative.     All other systems reviewed and are negative.      Previous History     Past Medical History:  Past Medical History:   Diagnosis Date    Body mass index 35.0-35.9, adult     Borderline diabetes     Chronic low back pain     Dizziness     DUB (dysfunctional uterine bleeding)     Gastroparesis     GERD (gastroesophageal reflux disease)     Hyperglycemia     Hyperlipidemia LDL goal <70     Insomnia     Knee pain, bilateral     Migraine     Paraspinal muscle spasm     Vitamin D deficiency        Past Surgical History:  Past Surgical History:   Procedure Laterality Date    Cesarean section  2004    Colonoscopy      Endometrial ablation w/ novasure      Esophagoscopy / egd      Hx appendectomy      Hx back surgery      Hx lap cholecystectomy      Laparoscopic tubal ligation  07/30/2022    Nissen fundoplication  08/13/2022       Social History:  Social History     Tobacco Use    Smoking status: Never    Smokeless tobacco: Never   Vaping Use    Vaping status: Never Used   Substance Use Topics    Alcohol use: Never    Drug use: Never     Social History     Substance and Sexual Activity   Drug Use Never       Family  History:  Family History   Problem Relation Age of Onset    Hypertension (High Blood Pressure) Mother     COPD Father     Diabetes type II Father     Hypertension (High Blood Pressure) Father     Kidney Disease Father     Throat cancer Sister     Coronary Artery Disease Brother     No Known Problems Half-Sister     No Known Problems Half-Brother     No Known Problems Maternal Aunt     No Known Problems Maternal Uncle     No Known Problems Paternal Aunt     No Known Problems Paternal Uncle     No Known Problems Maternal Grandmother     No Known Problems Maternal Grandfather     No Known  Problems Paternal Grandmother     No Known Problems Paternal Grandfather     No Known Problems Daughter     No Known Problems Son        Medication History:  Current Outpatient Medications   Medication Sig    albuterol sulfate (PROVENTIL OR VENTOLIN OR PROAIR) 90 mcg/actuation Inhalation oral inhaler Take 2 Puffs by inhalation Every 6 hours as needed    bethanechol chloride (URECHOLINE) 10 mg Oral Tablet Take 1 Tablet (10 mg total) by mouth Twice daily    Biotin 10 mg Oral Tablet Take 1 Tablet (10 mg total) by mouth Once a day    buprenorphine (BUTRANS) 15 mcg/hour Transdermal Patch Weekly Place 1 Patch on the skin Every 7 days Indications: severe chronic pain with opioid tolerance    cetirizine (ZYRTEC) 10 mg Oral Tablet Take 1 Tablet (10 mg total) by mouth Once a day    cholecalciferol, vitamin D3, 1,250 mcg (50,000 unit) Oral Capsule Take 1 Capsule (50,000 Units total) by mouth Every 7 days    Colestipol (COLESTID) 1 gram Oral Tablet Take 2 Tablets (2 g total) by mouth Once a day Takes two in am    cyclobenzaprine (FLEXERIL) 10 mg Oral Tablet Take 1 Tablet (10 mg total) by mouth Twice daily    famotidine (PEPCID) 40 mg Oral Tablet Take 1 Tablet (40 mg total) by mouth Every evening    linaCLOtide (LINZESS) 145 mcg Oral Capsule Take 1 Capsule (145 mcg total) by mouth Every morning    metoclopramide HCl (REGLAN) 5 mg Oral Tablet Take  1 Tablet (5 mg total) by mouth Once a day Patient taking twice a day    naloxone (NARCAN) 4 mg per spray nasal spray 1 Spray by INTRANASAL route Every 2 minutes as needed for actual or suspected opioid overdose. Call 911 if used.    nitrofurantoin monohyd/m-cryst (MACROBID) 100 mg Oral Capsule Take 1 Capsule (100 mg total) by mouth Twice daily for 7 days    omeprazole (PRILOSEC) 40 mg Oral Capsule, Delayed Release(E.C.) Take 1 Capsule (40 mg total) by mouth Twice daily    ondansetron (ZOFRAN ODT) 4 mg Oral Tablet, Rapid Dissolve Take 1 Tablet (4 mg total) by mouth Every 8 hours as needed for Nausea/Vomiting    ondansetron (ZOFRAN ODT) 4 mg Oral Tablet, Rapid Dissolve Take 1 Tablet (4 mg total) by mouth Every 8 hours as needed for Nausea/Vomiting for up to 4 days    phentermine (ADIPEX-P) 37.5 mg Oral Tablet Take 1 Tablet (37.5 mg total) by mouth Every morning before breakfast    pregabalin (LYRICA) 200 mg Oral Capsule Take 1 Capsule (200 mg total) by mouth Three times a day    prochlorperazine (COMPAZINE) 10 mg Oral Tablet Take 1 Tablet (10 mg total) by mouth Three times a day as needed for Nausea/Vomiting for up to 3 days    QUEtiapine (SEROQUEL) 100 mg Oral Tablet Take 1 Tablet (100 mg total) by mouth Twice daily    rosuvastatin (CRESTOR) 5 mg Oral Tablet Take 1 Tablet (5 mg total) by mouth Once a day       Allergies:  Allergies   Allergen Reactions    Other Rash     Allergic to the sun        Physical Exam     Vitals:    BP (!) 141/91   Pulse 82   Temp 36.6 C (97.9 F)   Resp 20   Ht 1.727 m (5\' 8" )  Wt 109 kg (240 lb)   SpO2 99%   BMI 36.49 kg/m           Physical Exam  Vitals and nursing note reviewed.   Constitutional:       General: She is not in acute distress.     Appearance: Normal appearance. She is well-developed.   HENT:      Head: Normocephalic and atraumatic.      Right Ear: Tympanic membrane normal.      Nose: Nose normal.      Mouth/Throat:      Mouth: Mucous membranes are moist.   Eyes:       Extraocular Movements: Extraocular movements intact.      Conjunctiva/sclera: Conjunctivae normal.      Pupils: Pupils are equal, round, and reactive to light.   Cardiovascular:      Rate and Rhythm: Normal rate and regular rhythm.      Pulses: Normal pulses.      Heart sounds: No murmur heard.  Pulmonary:      Effort: Pulmonary effort is normal. No respiratory distress.      Breath sounds: Normal breath sounds.   Abdominal:      General: Bowel sounds are normal.      Palpations: Abdomen is soft.      Tenderness: There is no abdominal tenderness.   Musculoskeletal:         General: No swelling. Normal range of motion.      Cervical back: Normal range of motion and neck supple.   Skin:     General: Skin is warm and dry.      Capillary Refill: Capillary refill takes less than 2 seconds.   Neurological:      General: No focal deficit present.      Mental Status: She is alert and oriented to person, place, and time.   Psychiatric:         Mood and Affect: Mood normal.         Diagnostic Studies/Treatment     Medications:  Medications Administered in the ED   cefTRIAXone (ROCEPHIN) 2 g in NS 50 mL IVPB minibag (2 g Intravenous New Bag/New Syringe 10/07/23 2216)   LR bolus infusion 1,000 mL (0 mL Intravenous Stopped 10/07/23 2136)   pantoprazole (PROTONIX) 4 mg/mL injection (40 mg Intravenous Given 10/07/23 2106)   prochlorperazine (COMPAZINE) 5 mg/mL injection (10 mg Intravenous Given 10/07/23 2106)   iohexol (OMNIPAQUE 350) infusion (100 mL Intravenous Given 10/07/23 2208)   diatrizoate meglumine & sodium oral solution (30 mL Oral Given 10/07/23 2208)       New Prescriptions    NITROFURANTOIN MONOHYD/M-CRYST (MACROBID) 100 MG ORAL CAPSULE    Take 1 Capsule (100 mg total) by mouth Twice daily for 7 days       Labs:    Results for orders placed or performed during the hospital encounter of 10/07/23 (from the past 12 hour(s))   COMPREHENSIVE METABOLIC PANEL, NON-FASTING   Result Value Ref Range    SODIUM 137 136 - 145  mmol/L    POTASSIUM 3.6 3.5 - 5.1 mmol/L    CHLORIDE 103 98 - 107 mmol/L    CO2 TOTAL 23 21 - 32 mmol/L    ANION GAP 11 4 - 13 mmol/L    BUN 12 7 - 18 mg/dL    CREATININE 1.02 (H) 0.55 - 1.02 mg/dL    BUN/CREA RATIO 11     ESTIMATED GFR 66 >59 mL/min/1.18m^2  ALBUMIN 4.0 3.4 - 5.0 g/dL    CALCIUM 9.0 8.5 - 16.1 mg/dL    GLUCOSE 096 74 - 045 mg/dL    ALKALINE PHOSPHATASE 83 46 - 116 U/L    ALT (SGPT) 36 <=78 U/L    AST (SGOT) 24 15 - 37 U/L    BILIRUBIN TOTAL 0.8 0.2 - 1.0 mg/dL    PROTEIN TOTAL 7.7 6.4 - 8.2 g/dL    ALBUMIN/GLOBULIN RATIO 1.1 0.8 - 1.4    OSMOLALITY, CALCULATED 274 270 - 290 mOsm/kg    CALCIUM, CORRECTED 9.0 mg/dL    GLOBULIN 3.7    MAGNESIUM   Result Value Ref Range    MAGNESIUM 1.9 1.8 - 2.4 mg/dL   THYROID STIMULATING HORMONE (SENSITIVE TSH)   Result Value Ref Range    TSH 2.314 0.358 - 3.740 uIU/mL   CBC WITH DIFF   Result Value Ref Range    WBC 10.0 4.0 - 10.5 x10^3/uL    RBC 5.07 4.20 - 5.40 x10^6/uL    HGB 15.0 12.5 - 16.0 g/dL    HCT 40.9 81.1 - 91.4 %    MCV 86.5 78.0 - 99.0 fL    MCH 29.6 27.0 - 32.0 pg    MCHC 34.2 32.0 - 36.0 g/dL    RDW 78.2 95.6 - 21.3 %    PLATELETS 289 140 - 440 x10^3/uL    MPV 7.4 7.4 - 10.4 fL    NEUTROPHIL % 69 40 - 76 %    LYMPHOCYTE % 23 (L) 25 - 45 %    MONOCYTE % 7 0 - 12 %    EOSINOPHIL % 1 1 - 7 %    BASOPHIL % 0 0 - 3 %    NEUTROPHIL # 6.91 1.80 - 8.40 x10^3/uL    LYMPHOCYTE # 2.35 1.10 - 5.00 x10^3/uL    MONOCYTE # 0.66 0.00 - 1.30 x10^3/uL    EOSINOPHIL # 0.09 0.00 - 0.80 x10^3/uL    BASOPHIL # 0.02 0.00 - 0.30 x10^3/uL   URINALYSIS, MACRO/MICRO   Result Value Ref Range    COLOR Light Yellow Light Yellow, Yellow    APPEARANCE Clear Clear    SPECIFIC GRAVITY 1.015 1.003 - 1.035    PH 6.5 4.6 - 8.0    LEUKOCYTES Moderate (A) Negative WBCs/uL    NITRITE Negative Negative    PROTEIN Negative Negative mg/dL    GLUCOSE Negative Negative mg/dL    KETONES 40 (A) Negative mg/dL    BILIRUBIN Small (A) Negative mg/dL    BLOOD Negative Negative mg/dL     UROBILINOGEN 1.0 0.2 - 1.0 mg/dL   URINALYSIS, MICROSCOPIC   Result Value Ref Range    RBCS 0-3 0-3, Not Present /hpf    BACTERIA Rare (A) Negative /hpf    WBCS 11-15 (A) Not Present, Occasional, 0-5 /hpf    SQUAMOUS EPITHELIAL Several (A) Not Present, Few /hpf        Radiology:  CT ABDOMEN PELVIS W IV CONTRAST    CT ABDOMEN PELVIS W IV CONTRAST   Final Result   NO SIGNIFICANT CHANGE OVER THE PAST 48 HOURS. NO ACUTE FINDINGS.          One or more dose reduction techniques were used (e.g., Automated exposure control, adjustment of the mA and/or kV according to patient size, use of iterative reconstruction technique).         Radiologist location ID: [id number]             ECG:  NONE            Differential diagnosis  1. NAUSEA 2.  LOSS OF APPETITE 3.  EPIGASTRIC PAIN     Course/Disposition/Plan     Course:     ED Course as of 10/07/23 2239   [facility] Oct 07, 2023   2235 CT negative for any obstruction patient will be discharged home.  Patient advised to follow-up with her primary physician to see if that she can be prescribed appetite stimulant     PATIENT HAS BEEN SEEN ON MULTIPLE OCCASIONS FOR SAME COMPLAINT SHE HAS NOT FOLLOWED UP GASTROENTEROLOGY.  WILL OBTAIN CT ABDOMEN PELVIS WITH ORAL AND IV CONTRAST.  Disposition:    Discharged    Condition at Disposition:   STABLE    Follow up:   Weyman Pedro, [place]  [address]  [place] [address]  [phone]    Schedule an appointment as soon as possible for a visit in 3 days  If symptoms worsen      Clinical Impression:     Clinical Impression   Nausea (Primary)   Psychogenic loss of appetite   Urinary tract infection         Tivis Ringer, MD

## 2023-10-10 ENCOUNTER — Ambulatory Visit (RURAL_HEALTH_CENTER): Payer: MEDICAID | Attending: Family Medicine | Admitting: Family Medicine

## 2023-10-10 ENCOUNTER — Encounter (RURAL_HEALTH_CENTER): Payer: Self-pay | Admitting: Family Medicine

## 2023-10-10 ENCOUNTER — Ambulatory Visit (HOSPITAL_COMMUNITY): Payer: Self-pay

## 2023-10-10 ENCOUNTER — Other Ambulatory Visit: Payer: Self-pay

## 2023-10-10 VITALS — BP 110/80 | HR 98 | Temp 100.0°F | Ht 68.0 in | Wt 242.0 lb

## 2023-10-10 DIAGNOSIS — R110 Nausea: Secondary | ICD-10-CM | POA: Insufficient documentation

## 2023-10-10 DIAGNOSIS — Z6836 Body mass index (BMI) 36.0-36.9, adult: Secondary | ICD-10-CM | POA: Insufficient documentation

## 2023-10-10 DIAGNOSIS — N390 Urinary tract infection, site not specified: Secondary | ICD-10-CM | POA: Insufficient documentation

## 2023-10-10 DIAGNOSIS — J020 Streptococcal pharyngitis: Secondary | ICD-10-CM | POA: Insufficient documentation

## 2023-10-10 DIAGNOSIS — R4586 Emotional lability: Secondary | ICD-10-CM | POA: Insufficient documentation

## 2023-10-10 DIAGNOSIS — B999 Unspecified infectious disease: Secondary | ICD-10-CM | POA: Insufficient documentation

## 2023-10-10 LAB — POCT RAPID STREP A: RAPID STREP A (POCT): POSITIVE

## 2023-10-10 LAB — URINE CULTURE,ROUTINE: URINE CULTURE: 5000 — AB

## 2023-10-10 MED ORDER — VENLAFAXINE ER 75 MG CAPSULE,EXTENDED RELEASE 24 HR
75.0000 mg | ORAL_CAPSULE | Freq: Every day | ORAL | 3 refills | Status: DC
Start: 2023-10-10 — End: 2024-01-30

## 2023-10-10 MED ORDER — LEVOFLOXACIN 500 MG TABLET
500.0000 mg | ORAL_TABLET | ORAL | 0 refills | Status: DC
Start: 2023-10-10 — End: 2023-10-24

## 2023-10-10 MED ORDER — BUPRENORPHINE 15 MCG/HOUR WEEKLY TRANSDERMAL PATCH
1.0000 | MEDICATED_PATCH | TRANSDERMAL | 5 refills | Status: DC
Start: 2023-10-10 — End: 2023-12-20

## 2023-10-10 NOTE — Addendum Note (Signed)
Addended by: Ralene Bathe on: 10/10/2023 05:30 PM     Modules accepted: Orders

## 2023-10-10 NOTE — Progress Notes (Signed)
FAMILY MEDICINE, [facility] FAMILY MEDIC[facility]  [address]  [place] [address]  Operated by [facility]     Name: Lindsay Crawford MRN:  [id number]   Date of Birth: 03-Jan-1976 Age: 47 y.o.   Date: 10/10/2023  Time: 15:16     Provider: Weyman Pedro, DO    Assessment/Plan:    Problem List Items Addressed This Visit    None  Visit Diagnoses       Nausea    -  Primary    UTI (urinary tract infection)        Relevant Orders    URINALYSIS, MACROSCOPIC AND MICROSCOPIC W/CULTURE REFLEX    BMI 36.0-36.9,adult        Recurrent infections        Relevant Orders    IMMUNOGLOBULIN PROFILE (IGA, IGG, AND IGM), SERUM    CBC/DIFF    COMPREHENSIVE METABOLIC PANEL, NON-FASTING    Strep pharyngitis        Rebound mood swings             Add Effexor low dose    Levaquin for recurrent strep. Will treat boyfriend as well. Change out toothbrush.     Repeat lab after antibiotic are all complete    Push fluids. F/u if sx fail to resolve or worsen    No follow-ups on file.  Orders Placed This Encounter    IMMUNOGLOBULIN PROFILE (IGA, IGG, AND IGM), SERUM    URINALYSIS, MACROSCOPIC AND MICROSCOPIC W/CULTURE REFLEX    CBC/DIFF    COMPREHENSIVE METABOLIC PANEL, NON-FASTING    venlafaxine (EFFEXOR XR) 75 mg Oral Capsule, Sust. Release 24 hr    buprenorphine (BUTRANS) 15 mcg/hour Transdermal Patch Weekly    levoFLOXacin (LEVAQUIN) 500 mg Oral Tablet           Reason for visit: Follow Up (2 week follow up/She had stomach virus when she put on patch she will have to try again this week idet pill doing good)      History of Present Illness:  Lindsay Crawford is a 47 y.o. female presenting in f/u after recent ER visit.She has a UTI; she is taking macrobid.  She was having significant nausea and vomiting, but she states that since she started the Macrobid that has completely resolved.  She states that she has struggled with moodiness and her sister recommended that she try Effexor which helped her  tremendously after she went through menopause.  She would like to try this.      She use the pain patch for her back pain for a day and a half before she developed significant nausea and vomiting.  She continued to have nausea and vomiting for about 4 days and had to go to the ER.  She was told that she had a stomach virus.  She states that her symptoms have completely resolved and she is planning on restarting the patch this evening.  She states that before she took the patch off, she states it helps significantly with her back pain without causing a lot of issues for her.    She has taken the phentermine.  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    Knee pain, right  Obesity (BMI 35.0-39.9 without comorbidity)    Borderline diabetes    Irritable bowel syndrome with constipation    DUB (dysfunctional uterine bleeding)    Gastroparesis    Hyperlipidemia LDL goal <70    Insomnia    Vitamin D deficiency    Paraspinal muscle spasm    Migraine    GERD (gastroesophageal reflux disease)    Pelvic pain    Menorrhagia with irregular cycle    S/P Nissen fundoplication (without gastrostomy tube) procedure    Vaginal discharge    Fibroids, intramural    Menopausal symptoms    Hair loss    Irregular periods/menstrual cycles    Kidney stone    Chronic right-sided low back pain        Historical Data    Past Medical History:  Past Medical History:   Diagnosis Date    Body mass index 35.0-35.9, adult     Borderline diabetes     Chronic low back pain     Dizziness     DUB (dysfunctional uterine bleeding)     Gastroparesis     GERD (gastroesophageal reflux disease)     Hyperglycemia     Hyperlipidemia LDL goal <70     Insomnia     Knee pain, bilateral     Migraine     Paraspinal muscle spasm     Vitamin D  deficiency          Past Surgical History:  Past Surgical History:   Procedure Laterality Date    CESAREAN SECTION  2004    COLONOSCOPY      Dr. Lequita Halt in [place]    ENDOMETRIAL ABLATION W/ NOVASURE      dr brodnik 12/14/22    ESOPHAGOSCOPY / EGD      HX APPENDECTOMY      HX BACK SURGERY      november 2019    HX LAP CHOLECYSTECTOMY      LAPAROSCOPIC TUBAL LIGATION  07/30/2022    with D&C brodmik    NISSEN FUNDOPLICATION  08/13/2022    had in [place]         Allergies:  Allergies   Allergen Reactions    Other Rash     Allergic to the sun      Medications:  albuterol sulfate (PROVENTIL OR VENTOLIN OR PROAIR) 90 mcg/actuation Inhalation oral inhaler, Take 2 Puffs by inhalation Every 6 hours as needed  bethanechol chloride (URECHOLINE) 10 mg Oral Tablet, Take 1 Tablet (10 mg total) by mouth Twice daily  Biotin 10 mg Oral Tablet, Take 1 Tablet (10 mg total) by mouth Once a day  cetirizine (ZYRTEC) 10 mg Oral Tablet, Take 1 Tablet (10 mg total) by mouth Once a day  cholecalciferol, vitamin D3, 1,250 mcg (50,000 unit) Oral Capsule, Take 1 Capsule (50,000 Units total) by mouth Every 7 days  Colestipol (COLESTID) 1 gram Oral Tablet, Take 2 Tablets (2 g total) by mouth Once a day Takes two in am  cyclobenzaprine (FLEXERIL) 10 mg Oral Tablet, Take 1 Tablet (10 mg total) by mouth Twice daily  famotidine (PEPCID) 40 mg Oral Tablet, Take 1 Tablet (40 mg total) by mouth Every evening  linaCLOtide (LINZESS) 145 mcg Oral Capsule, Take 1 Capsule (145 mcg total) by mouth Every morning  metoclopramide HCl (REGLAN) 5 mg Oral Tablet, Take 1 Tablet (5 mg total) by mouth Once a day Patient taking twice a day  naloxone (NARCAN) 4 mg per spray nasal spray, 1 Spray by INTRANASAL  route Every 2 minutes as needed for actual or suspected opioid overdose. Call 911 if used.  nitrofurantoin monohyd/m-cryst (MACROBID) 100 mg Oral Capsule, Take 1 Capsule (100 mg total) by mouth Twice daily for 7 days  omeprazole (PRILOSEC) 40 mg Oral Capsule, Delayed  Release(E.C.), Take 1 Capsule (40 mg total) by mouth Twice daily  ondansetron (ZOFRAN ODT) 4 mg Oral Tablet, Rapid Dissolve, Take 1 Tablet (4 mg total) by mouth Every 8 hours as needed for Nausea/Vomiting (Patient not taking: Reported on 10/10/2023)  phentermine (ADIPEX-P) 37.5 mg Oral Tablet, Take 1 Tablet (37.5 mg total) by mouth Every morning before breakfast  pregabalin (LYRICA) 200 mg Oral Capsule, Take 1 Capsule (200 mg total) by mouth Three times a day  QUEtiapine (SEROQUEL) 100 mg Oral Tablet, Take 1 Tablet (100 mg total) by mouth Twice daily  rosuvastatin (CRESTOR) 5 mg Oral Tablet, Take 1 Tablet (5 mg total) by mouth Once a day  amoxicillin-pot clavulanate (AUGMENTIN) 875-125 mg Oral Tablet, Take 1 Tablet by mouth Twice daily for 10 days  buprenorphine (BUTRANS) 15 mcg/hour Transdermal Patch Weekly, Place 1 Patch on the skin Every 7 days Indications: severe chronic pain with opioid tolerance    No facility-administered medications prior to visit.     Family History:  Family Medical History:       Problem Relation (Age of Onset)    COPD Father    Coronary Artery Disease Brother    Diabetes type II Father    Hypertension (High Blood Pressure) Mother, Father    Kidney Disease Father    No Known Problems Half-Sister, Half-Brother, Maternal Aunt, Maternal Uncle, Paternal Aunt, Paternal Uncle, Maternal Grandmother, Maternal Grandfather, Paternal Grandmother, Paternal Grandfather, Daughter, Son    Throat cancer Sister            Social History:  Social History     Socioeconomic History    Marital status: Widowed    Number of children: 1    Years of education: 12+    Highest education level: Associate degree: occupational, Scientist, product/process development, or vocational program   Tobacco Use    Smoking status: Never    Smokeless tobacco: Never   Vaping Use    Vaping status: Never Used   Substance and Sexual Activity    Alcohol use: Never    Drug use: Never    Sexual activity: Yes     Partners: Male     Birth control/protection: None      Social Determinants of Health     Financial Resource Strain: Low Risk  (12/29/2022)    Financial Resource Strain     SDOH Financial: No   Transportation Needs: Low Risk  (12/29/2022)    Transportation Needs     SDOH Transportation: No   Social Connections: Low Risk  (12/29/2022)    Social Connections     SDOH Social Isolation: 5 or more times a week   Intimate Partner Violence: Low Risk  (12/29/2022)    Intimate Partner Violence     SDOH Domestic Violence: No   Housing Stability: Low Risk  (12/29/2022)    Housing Stability     SDOH Housing Situation: I have housing.     SDOH Housing Worry: No           Review of Systems:  Any pertinent Review of Systems as addressed in the HPI above.    Physical Exam:  Vital Signs:  Vitals:    10/10/23 1358   BP: 110/80   Pulse: 98  Temp: 37.8 C (100 F)   TempSrc: Tympanic   SpO2: 97%   Weight: 110 kg (242 lb)   Height: 1.727 m (5\' 8" )   BMI: 36.87     Physical Exam  Vitals reviewed.   Constitutional:       General: She is not in acute distress.     Appearance: Normal appearance.   HENT:      Head: Normocephalic and atraumatic.      Right Ear: There is no impacted cerumen.      Left Ear: There is no impacted cerumen.      Nose: Rhinorrhea present.      Mouth/Throat:      Pharynx: Oropharyngeal exudate and posterior oropharyngeal erythema present.      Comments: Rapid strep is positive  Eyes:      General:         Right eye: No discharge.         Left eye: No discharge.   Neck:      Vascular: No carotid bruit.   Cardiovascular:      Rate and Rhythm: Normal rate and regular rhythm.      Pulses: Normal pulses.      Heart sounds: Normal heart sounds.   Pulmonary:      Effort: Pulmonary effort is normal.      Breath sounds: Normal breath sounds.   Abdominal:      Palpations: Abdomen is soft.      Tenderness: There is no abdominal tenderness.   Musculoskeletal:         General: Tenderness present.      Right lower leg: No edema.      Left lower leg: No edema.   Lymphadenopathy:       Cervical: Cervical adenopathy present.   Skin:     General: Skin is warm and dry.      Capillary Refill: Capillary refill takes less than 2 seconds.   Neurological:      Mental Status: She is alert.      Comments: Antalgic gait   Psychiatric:         Mood and Affect: Mood normal.         Behavior: Behavior normal.         Thought Content: Thought content normal.         Judgment: Judgment normal.          Weyman Pedro, DO     Portions of this note may be dictated using voice recognition software or a dictation service. Variances in spelling and vocabulary are possible and unintentional. Not all errors are caught/corrected. Please notify the Thereasa Parkin if any discrepancies are noted or if the meaning of any statement is not clear.

## 2023-10-11 ENCOUNTER — Encounter (INDEPENDENT_AMBULATORY_CARE_PROVIDER_SITE_OTHER): Payer: MEDICAID | Admitting: Physician Assistant

## 2023-10-11 ENCOUNTER — Ambulatory Visit (HOSPITAL_COMMUNITY)
Admission: RE | Admit: 2023-10-11 | Discharge: 2023-10-11 | Disposition: A | Discharge status: Still In Hospital | Payer: MEDICAID | Source: Ambulatory Visit

## 2023-10-11 DIAGNOSIS — M62830 Muscle spasm of back: Secondary | ICD-10-CM

## 2023-10-11 DIAGNOSIS — M5126 Other intervertebral disc displacement, lumbar region: Secondary | ICD-10-CM

## 2023-10-11 NOTE — PT Treatment (Signed)
[facility]  Outpatient Physical Therapy  [address]  [place], [address]  [phone]  (Fax) [phone]    Physical Therapy Treatment Note    Date: 10/11/2023  Patient's Name: Lindsay Crawford  Date of Birth: November 13, 1976  Physical Therapy Visit      Visit #/POC: 6/12  Authorization: 6 out of up 15  POC Signed?: N/A  POC Ends: 10/26/2023  Order Ends: 10/12/2023  Next Progress Note Due: Visit 6 or 30 days     Evaluating Physical Therapist: Ilsa Iha , PT, DPT   PT diagnosis/Reason for Referral: Low back pain  Next Scheduled Physician Appointment: Unknown   Allergies/Contraindications: N/A        Subjective: Patient states she has been sick for the last week. States feels 100% better today. States that she continues to wear the pain patch and it does help her pain. States that she feels PT has helped strengthen her legs and improve the mobility in her back.     Objective: Treatment as follows:        EXERCISE/ACTIVITY NAME REPETITIONS RESISTANCE COMPLETED THIS DOS   Nu-Step X8 min L3 Yes   DKTC over yellow physioball    X1 min, x1      Yes   Lumbothoracic rotations over yellow physioball X40min, twice   Yes   Bridges over yellow physioball  X15, twice   Yes   Supine ab bicycles X30s, twice   Yes   Supine SLR flutter kicks X30s, twice   Yes   Trunk extension machine X10, twice #30lb Yes   Clamshells with resistance X10 ea LE, twice GTB No   Reciprocal UE/LE reaching in quadruped (piece wise bird dog)    X1 min, twice      No   Prone press-ups x20 AROM n   Bird dog X1 min, twice   No   Modified plank on knees    X30s, twice      No   Plank on forearms X30s, twice   no   HEP Review              n   Lateral flexion stretch     N   Side glides  with PB x12   N   Piriformis stretching    Manual N      HEP:  Access Code: [email or web address]     Assessment:  Patient is progressing well towards goals. Noted  improved core strengthening. She does require VC with supine flutter kicks for  proper technique.      Short Term Goals: 3 Weeks              -Reduce radicular symptoms with job performance (bending, twisting, lifting).  -Increase lumbar FWB AROM by 5 degrees into flexion/extension, lateral flexion, and rotation.  -Improve score on PSFS to at least 4.6 indicating improved ADL capacity and quality of life. (2.6 on 10/16; MDC =2)     Long Term Goals: 6 Weeks  -ROM / Strength WFL to permit work / Copywriter, advertising / household  / recreational ADL's without Compensatory mechanics due to pain or weakness.              -Community ambulation not limited by back pain.              -Improve score on PSFS to at least 6.6 indicating improved ADL capacity and quality of life. (MDC =2)     Plan:  Continue lumbar stabilization  program. Progress check next visit.            Total Session Time 35 and Timed code minutes 35  THERAPEUTIC EXERCISE 35 minutes      Trilby Leaver, PTA  10/11/2023, 15:38

## 2023-10-12 ENCOUNTER — Ambulatory Visit
Admission: RE | Admit: 2023-10-12 | Discharge: 2023-10-12 | Disposition: A | Discharge status: Still In Hospital | Payer: MEDICAID | Source: Ambulatory Visit

## 2023-10-12 DIAGNOSIS — M62830 Muscle spasm of back: Secondary | ICD-10-CM

## 2023-10-12 DIAGNOSIS — M5126 Other intervertebral disc displacement, lumbar region: Secondary | ICD-10-CM

## 2023-10-12 NOTE — PT Treatment (Signed)
[facility]  Outpatient Physical Therapy  [address]  [place], [address]  [phone]  (Fax) [phone]    Physical Therapy Discharge Note    Date: 10/12/2023  Patient's Name: Lindsay Crawford  Date of Birth: [birth year]  Physical Therapy Visit      DISCHARGE SUMMARY:  Patient seen for 6 of up to 12 planned visits to work on improving lumbar range of motion and reducing pain with functional mobility. 2 of 6 established goals met. Will require new order to re-establish care. Please see last recorded functional status below:   Ilsa Iha, PT, DPT        "Visit #/POC: 6/12  Authorization: 6 out of up 15  POC Signed?: N/A  POC Ends: 10/26/2023  Order Ends: 10/12/2023  Next Progress Note Due: Visit 6 or 30 days     Evaluating Physical Therapist: Ilsa Iha , PT, DPT   PT diagnosis/Reason for Referral: Low back pain  Next Scheduled Physician Appointment:  11/18  Allergies/Contraindications: N/A        Subjective: Patient states she will return to her MD on Monday. States that when she returns they will decide if she needs injections based on how pain in (R) LE is. States she continues to  have pain down her (R) LE but it is not as intense or as frequent. 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 States that she feels PT has helped strength and mobility in her back. States that they have prescribed her a pill for weight loss and has currently lost 6 pounds.      Objective: Treatment as follows:      Lumbar AROM *No pain just muscle pulling with all AROM*    right left Right           Left   Flexion 36   WFL   Extension 14, *inc pain   20   Sidebend 10 *inc pain 13 15                  18    Rotation 3, *inc pain 5  12                  15       Strength                                 10/16                      10/16    Right          11/13 Left            11/13   Hip flexion (L1,2) 4+                 4+ 4+   Hip extension (L5,S1,2) NT NT   Knee  flexion (S1) 4+                 5 4+                   5   Knee extension (L2-4) 4+                 5 4+                   5   Ankle DF (L4-5) 4+  5 4+                   5   Ankle PF (S1,2) 4+                 5 4+                   5     EXERCISE/ACTIVITY NAME REPETITIONS RESISTANCE COMPLETED THIS DOS   Nu-Step X8 min L3 n   DKTC over yellow physioball    X1 min, x1      n   Lumbothoracic rotations over yellow physioball X10min, twice   n   Bridges over yellow physioball  X15, twice   n   Supine ab bicycles X30s, twice   n   Supine SLR flutter kicks X30s, twice   n   Trunk extension machine X10, twice #30lb n   Clamshells with resistance X10 ea LE, twice GTB No   Reciprocal UE/LE reaching in quadruped (piece wise bird dog)    X1 min, twice      No   Prone press-ups x20 AROM n   Bird dog X1 min, twice   No   Modified plank on knees    X30s, twice      No   Plank on forearms X30s, twice   no   HEP Review              n   Lateral flexion stretch     N   Side glides  with PB x12   N   Piriformis stretching    Manual N   Reassessment of goals and objective findings   y      HEP:  Access Code: [email or web address]   Patient-Specific Functional Score:     Problem Score     11/13   1. Daily house work (dishes, cooking) 4                6   2. Ambulatory function 4                 8   3. Pain free days 0                 0   Total 2.6              4.6   Total score = sum of the activity scores/number of activities    Minimal detectable change (90% CI) for avg score = 2 points    Minimal detectable change (90% CI) for single activity score = 3 points       Assessment:  Patient is progressing well towards goals. Noted  improved core and LE strength. Her mobility has also improved ,but she does require VC with rotation and side bending to prevent compensatory strategies. She has currently met 2 out of 3 STGs and No LTGs but she is progressing well towards goals. Current PSFS is 4.6.     Short Term Goals: 3 Weeks               -Reduce radicular symptoms with job performance (bending, twisting, lifting).Progressing 11/13  -Increase lumbar FWB AROM by 5 degrees into flexion/extension, lateral flexion, and rotation.MET 11/13  -Improve score on PSFS to at least 4.6 indicating improved ADL capacity and quality of life. (2.6 on 10/16; MDC =2)MET 11/13     Long Term Goals: 6 Weeks  -ROM / Strength WFL to permit work /  personal / household  / recreational ADL's without Compensatory mechanics due to pain or weakness.Progressing 11/13              -Community ambulation not limited by back pain. Progressing 11/13              -Improve score on PSFS to at least 6.6 indicating improved ADL capacity and quality of life. (MDC =2) Progressing 11/13     Plan:  Awaiting MD recommendation.                Total Session Time 23 and Timed code minutes 23  THERAPEUTIC ACTIVITY 23 minutes      Trilby Leaver, PTA  10/12/2023, 15:28"

## 2023-10-20 ENCOUNTER — Ambulatory Visit: Payer: MEDICAID | Attending: Family Medicine | Admitting: Family Medicine

## 2023-10-20 ENCOUNTER — Ambulatory Visit (RURAL_HEALTH_CENTER): Payer: MEDICAID | Attending: Family Medicine

## 2023-10-20 ENCOUNTER — Other Ambulatory Visit: Payer: Self-pay

## 2023-10-20 DIAGNOSIS — B999 Unspecified infectious disease: Secondary | ICD-10-CM | POA: Insufficient documentation

## 2023-10-20 DIAGNOSIS — N390 Urinary tract infection, site not specified: Secondary | ICD-10-CM | POA: Insufficient documentation

## 2023-10-21 LAB — URINALYSIS, MICROSCOPIC
RBCS: 6 /[HPF] — ABNORMAL HIGH (ref <4)
SQUAMOUS EPITHELIAL: 40 /[HPF] — ABNORMAL HIGH (ref <28)
WBCS: 2 /[HPF] (ref <6)

## 2023-10-21 LAB — COMPREHENSIVE METABOLIC PANEL, NON-FASTING
ALBUMIN/GLOBULIN RATIO: 1.5 — ABNORMAL HIGH (ref 0.8–1.4)
ALBUMIN: 4.2 g/dL (ref 3.5–5.7)
ALKALINE PHOSPHATASE: 95 U/L (ref 34–104)
ALT (SGPT): 41 U/L (ref 7–52)
ANION GAP: 6 mmol/L (ref 4–13)
AST (SGOT): 36 U/L (ref 13–39)
BILIRUBIN TOTAL: 0.5 mg/dL (ref 0.3–1.0)
BUN/CREA RATIO: 11 (ref 6–22)
BUN: 10 mg/dL (ref 7–25)
CALCIUM, CORRECTED: 9 mg/dL (ref 8.9–10.8)
CALCIUM: 9.2 mg/dL (ref 8.6–10.3)
CHLORIDE: 104 mmol/L (ref 98–107)
CO2 TOTAL: 30 mmol/L (ref 21–31)
CREATININE: 0.94 mg/dL (ref 0.60–1.30)
ESTIMATED GFR: 76 mL/min/{1.73_m2} (ref >59)
GLOBULIN: 2.8 (ref 2.0–3.5)
GLUCOSE: 94 mg/dL (ref 74–109)
OSMOLALITY, CALCULATED: 278 mosm/kg (ref 270–290)
POTASSIUM: 4.2 mmol/L (ref 3.5–5.1)
PROTEIN TOTAL: 7 g/dL (ref 6.4–8.9)
SODIUM: 140 mmol/L (ref 136–145)

## 2023-10-21 LAB — URINALYSIS, MACROSCOPIC
BILIRUBIN: NEGATIVE mg/dL
BLOOD: NEGATIVE mg/dL
GLUCOSE: NEGATIVE mg/dL
KETONES: NEGATIVE mg/dL
LEUKOCYTES: 25 WBCs/uL — AB
NITRITE: NEGATIVE
PH: 6 (ref 5.0–9.0)
PROTEIN: 30 mg/dL — AB
SPECIFIC GRAVITY: 1.027 (ref 1.002–1.030)
UROBILINOGEN: NORMAL mg/dL

## 2023-10-21 LAB — CBC WITH DIFF
BASOPHIL #: 0 10*3/uL (ref 0.00–0.10)
BASOPHIL %: 1 % (ref 0–1)
EOSINOPHIL #: 0.2 10*3/uL (ref 0.00–0.50)
EOSINOPHIL %: 4 % (ref 1–7)
HCT: 40.1 % (ref 31.2–41.9)
HGB: 13.7 g/dL (ref 10.9–14.3)
LYMPHOCYTE #: 2.3 10*3/uL (ref 1.00–3.00)
LYMPHOCYTE %: 35 % (ref 16–44)
MCH: 29.4 pg (ref 24.7–32.8)
MCHC: 34.2 g/dL (ref 32.3–35.6)
MCV: 85.8 fL (ref 75.5–95.3)
MONOCYTE #: 0.7 10*3/uL (ref 0.30–1.00)
MONOCYTE %: 11 % (ref 5–13)
MPV: 9.1 fL (ref 7.9–10.8)
NEUTROPHIL #: 3.2 10*3/uL (ref 1.85–7.80)
NEUTROPHIL %: 50 % (ref 43–77)
PLATELETS: 242 10*3/uL (ref 140–440)
RBC: 4.68 10*6/uL (ref 3.63–4.92)
RDW: 12.8 % (ref 12.3–17.7)
WBC: 6.4 10*3/uL (ref 3.8–11.8)

## 2023-10-24 ENCOUNTER — Other Ambulatory Visit: Payer: Self-pay

## 2023-10-24 ENCOUNTER — Ambulatory Visit (RURAL_HEALTH_CENTER): Payer: MEDICAID | Attending: Family Medicine | Admitting: Family Medicine

## 2023-10-24 ENCOUNTER — Ambulatory Visit: Payer: MEDICAID | Attending: Family Medicine | Admitting: Family Medicine

## 2023-10-24 ENCOUNTER — Encounter (RURAL_HEALTH_CENTER): Payer: Self-pay | Admitting: Family Medicine

## 2023-10-24 VITALS — BP 110/70 | HR 82 | Temp 99.5°F | Ht 68.0 in | Wt 249.0 lb

## 2023-10-24 DIAGNOSIS — R82998 Other abnormal findings in urine: Secondary | ICD-10-CM | POA: Insufficient documentation

## 2023-10-24 DIAGNOSIS — N951 Menopausal and female climacteric states: Secondary | ICD-10-CM | POA: Insufficient documentation

## 2023-10-24 DIAGNOSIS — M5126 Other intervertebral disc displacement, lumbar region: Secondary | ICD-10-CM | POA: Insufficient documentation

## 2023-10-24 DIAGNOSIS — N390 Urinary tract infection, site not specified: Secondary | ICD-10-CM | POA: Insufficient documentation

## 2023-10-24 DIAGNOSIS — N200 Calculus of kidney: Secondary | ICD-10-CM | POA: Insufficient documentation

## 2023-10-24 DIAGNOSIS — Z6835 Body mass index (BMI) 35.0-35.9, adult: Secondary | ICD-10-CM | POA: Insufficient documentation

## 2023-10-24 DIAGNOSIS — Z79899 Other long term (current) drug therapy: Secondary | ICD-10-CM | POA: Insufficient documentation

## 2023-10-24 LAB — POCT URINE DIPSTICK
BILIRUBIN: NEGATIVE
BLOOD: NEGATIVE
GLUCOSE: NEGATIVE
KETONE: NEGATIVE
NITRITE: NEGATIVE
PH: 6
PROTEIN: NEGATIVE
SPECIFIC GRAVITY: 1.015
UROBILINOGEN: 0.2

## 2023-10-24 LAB — URINALYSIS, MICROSCOPIC
RBCS: <1 /[HPF] (ref <4)
SQUAMOUS EPITHELIAL: 6 /[HPF] (ref <28)
WBCS: 3 /[HPF] (ref <6)

## 2023-10-24 LAB — URINALYSIS, MACROSCOPIC
BILIRUBIN: NEGATIVE mg/dL
BLOOD: NEGATIVE mg/dL
GLUCOSE: NEGATIVE mg/dL
KETONES: NEGATIVE mg/dL
LEUKOCYTES: 75 WBCs/uL — AB
NITRITE: NEGATIVE
PH: 6 (ref 5.0–9.0)
PROTEIN: NEGATIVE mg/dL
SPECIFIC GRAVITY: 1.015 (ref 1.002–1.030)
UROBILINOGEN: NORMAL mg/dL

## 2023-10-24 MED ORDER — METOCLOPRAMIDE 5 MG TABLET
5.0000 mg | ORAL_TABLET | Freq: Three times a day (TID) | ORAL | 3 refills | Status: DC
Start: 2023-10-24 — End: 2024-05-28

## 2023-10-24 NOTE — Progress Notes (Signed)
FAMILY MEDICINE, [facility]  [address]  [place] [address]  Operated by [facility]     Name: Lindsay Crawford MRN:  [id number]   Date of Birth: [birth year] Age: 47 y.o.   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 Reiterated recommendations they had at the bottom of her visit in October.    Repeat urine culture as still with leukocytes    Keep f/u with back specialist    Continue weight loss effort with goal <230.    Benefits from butrans. Has on file. Continue dose.    Menopausal sx and moodiness better with low dose Effexor. 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 Continue with caution    Results for orders placed or performed in visit on 10/20/23 (from the past 672 hour(s))   COMPREHENSIVE METABOLIC PANEL, NON-FASTING    Collection Time: 10/20/23  2:44 PM   Result Value Ref Range    SODIUM 140 136 - 145 mmol/L    POTASSIUM 4.2 3.5 - 5.1 mmol/L    CHLORIDE 104 98 - 107 mmol/L    CO2 TOTAL 30 21 - 31 mmol/L    ANION GAP 6 4 - 13 mmol/L    BUN 10 7 - 25 mg/dL    CREATININE 2.53 6.64 - 1.30 mg/dL    BUN/CREA RATIO 11 6 - 22    ESTIMATED GFR 76 >59 mL/min/1.90m^2    ALBUMIN 4.2 3.5 - 5.7 g/dL    CALCIUM 9.2 8.6 - 40.3 mg/dL    GLUCOSE 94 74 - 474 mg/dL    ALKALINE PHOSPHATASE 95 34 - 104 U/L    ALT (SGPT) 41 7 - 52 U/L    AST (SGOT) 36 13 - 39 U/L    BILIRUBIN TOTAL 0.5  0.3 - 1.0 mg/dL    PROTEIN TOTAL 7.0 6.4 - 8.9 g/dL    ALBUMIN/GLOBULIN RATIO 1.5 (H) 0.8 - 1.4    OSMOLALITY, CALCULATED 278 270 - 290 mOsm/kg    CALCIUM, CORRECTED 9.0 8.9 - 10.8 mg/dL    GLOBULIN 2.8 2.0 - 3.5   URINALYSIS, MACROSCOPIC    Collection Time: 10/20/23  2:44 PM   Result Value Ref Range    COLOR Yellow Colorless, Light Yellow, Yellow    APPEARANCE Ex. Turbid (A) Clear    SPECIFIC GRAVITY 1.027 1.002 - 1.030    PH 6.0 5.0 - 9.0    LEUKOCYTES 25 (A) Negative, 100  WBCs/uL    NITRITE Negative Negative    PROTEIN 30 (A) Negative, 10 , 20  mg/dL    GLUCOSE Negative Negative, 30  mg/dL  KETONES Negative Negative, Trace mg/dL    BILIRUBIN Negative Negative, 0.5 mg/dL    BLOOD Negative Negative, 0.03 mg/dL    UROBILINOGEN Normal Normal mg/dL   URINALYSIS, MICROSCOPIC    Collection Time: 10/20/23  2:44 PM   Result Value Ref Range    BACTERIA Rare (A) Negative /hpf    MUCOUS Occasional Rare, Occasional, Few /hpf    CALCIUM OXALATE CRYSTALS Occasional (A) (none) /hpf    AMORPHOUS SEDIMENT Rare (A) (none) /hpf    BUDDING YEAST Few (A) (none) /hpf    RBCS 6 (H) <4 /hpf    WBCS 2 <6 /hpf    SQUAMOUS EPITHELIAL 40 (H) <28 /hpf   CBC WITH DIFF    Collection Time: 10/20/23  2:44 PM   Result Value Ref Range    WBC 6.4 3.8 - 11.8 x10^3/uL    RBC 4.68 3.63 - 4.92 x10^6/uL    HGB 13.7 10.9 - 14.3 g/dL    HCT 16.1 09.6 - 04.5 %    MCV 85.8 75.5 - 95.3 fL    MCH 29.4 24.7 - 32.8 pg    MCHC 34.2 32.3 - 35.6 g/dL    RDW 40.9 81.1 - 91.4 %    PLATELETS 242 140 - 440 x10^3/uL    MPV 9.1 7.9 - 10.8 fL    NEUTROPHIL % 50 43 - 77 %    LYMPHOCYTE % 35 16 - 44 %    MONOCYTE % 11 5 - 13 %    EOSINOPHIL % 4 1 - 7 %    BASOPHIL % 1 0 - 1 %    NEUTROPHIL # 3.20 1.85 - 7.80 x10^3/uL    LYMPHOCYTE # 2.30 1.00 - 3.00 x10^3/uL    MONOCYTE # 0.70 0.30 - 1.00 x10^3/uL    EOSINOPHIL # 0.20 0.00 - 0.50 x10^3/uL    BASOPHIL # 0.00 0.00 - 0.10 x10^3/uL   Results for orders placed or performed in visit on 10/10/23 (from the past 672 hour(s))                      Results for orders placed or performed during the hospital encounter of 10/07/23 (from the past 672 hour(s))   COMPREHENSIVE METABOLIC PANEL, NON-FASTING    Collection Time: 10/07/23  8:47 PM   Result Value Ref Range    SODIUM 137 136 - 145 mmol/L    POTASSIUM 3.6 3.5 - 5.1 mmol/L    CHLORIDE 103 98 - 107 mmol/L    CO2 TOTAL 23 21 - 32 mmol/L    ANION GAP 11 4 - 13 mmol/L    BUN 12 7 - 18 mg/dL    CREATININE 7.82 (H) 0.55 - 1.02 mg/dL    BUN/CREA RATIO 11     ESTIMATED GFR 66 >59 mL/min/1.58m^2    ALBUMIN 4.0 3.4 - 5.0 g/dL    CALCIUM 9.0 8.5 - 95.6 mg/dL    GLUCOSE 213 74 - 086 mg/dL    ALKALINE PHOSPHATASE 83 46 - 116 U/L    ALT (SGPT) 36 <=78 U/L    AST (SGOT) 24 15 - 37 U/L    BILIRUBIN TOTAL 0.8 0.2 - 1.0 mg/dL    PROTEIN TOTAL 7.7 6.4 - 8.2 g/dL    ALBUMIN/GLOBULIN RATIO 1.1 0.8 - 1.4    OSMOLALITY, CALCULATED 274 270 - 290 mOsm/kg    CALCIUM, CORRECTED 9.0 mg/dL    GLOBULIN 3.7    MAGNESIUM    Collection Time: 10/07/23  8:47 PM   Result Value Ref Range    MAGNESIUM 1.9 1.8 - 2.4 mg/dL   THYROID STIMULATING HORMONE (SENSITIVE TSH)    Collection Time: 10/07/23  8:47 PM   Result Value Ref Range    TSH 2.314 0.358 - 3.740 uIU/mL   CBC WITH DIFF    Collection Time: 10/07/23  8:47 PM   Result Value Ref Range    WBC 10.0 4.0 - 10.5 x10^3/uL    RBC 5.07 4.20 - 5.40 x10^6/uL    HGB 15.0 12.5 - 16.0 g/dL    HCT 16.1 09.6 - 04.5 %    MCV 86.5 78.0 - 99.0 fL    MCH 29.6 27.0 - 32.0 pg    MCHC 34.2 32.0 - 36.0 g/dL    RDW 40.9 81.1 - 91.4 %    PLATELETS 289 140 - 440 x10^3/uL    MPV 7.4 7.4 - 10.4 fL    NEUTROPHIL % 69 40 - 76 %    LYMPHOCYTE % 23 (L) 25 - 45 %    MONOCYTE % 7 0 - 12 %    EOSINOPHIL % 1 1 - 7 %    BASOPHIL % 0 0 - 3 %    NEUTROPHIL # 6.91 1.80 - 8.40 x10^3/uL    LYMPHOCYTE # 2.35 1.10 - 5.00 x10^3/uL    MONOCYTE # 0.66 0.00 - 1.30 x10^3/uL    EOSINOPHIL # 0.09 0.00 - 0.80 x10^3/uL    BASOPHIL # 0.02 0.00 - 0.30 x10^3/uL   URINE CULTURE,ROUTINE    Collection Time: 10/07/23  9:02 PM     Specimen: Urine, Clean Catch   Result Value Ref Range    URINE CULTURE 5,000 CFU/mL Mixed Flora (A)    URINALYSIS, MACRO/MICRO    Collection Time: 10/07/23  9:02 PM   Result Value Ref Range    COLOR Light Yellow Light Yellow, Yellow    APPEARANCE Clear Clear    SPECIFIC GRAVITY 1.015 1.003 - 1.035    PH 6.5 4.6 - 8.0    LEUKOCYTES Moderate (A) Negative WBCs/uL    NITRITE Negative Negative    PROTEIN Negative Negative mg/dL    GLUCOSE Negative Negative mg/dL    KETONES 40 (A) Negative mg/dL    BILIRUBIN Small (A) Negative mg/dL    BLOOD Negative Negative mg/dL    UROBILINOGEN 1.0 0.2 - 1.0 mg/dL   URINALYSIS, MICROSCOPIC    Collection Time: 10/07/23  9:02 PM   Result Value Ref Range    RBCS 0-3 0-3, Not Present /hpf    BACTERIA Rare (A) Negative /hpf    WBCS 11-15 (A) Not Present, Occasional, 0-5 /hpf    SQUAMOUS EPITHELIAL Several (A) Not Present, Few /hpf   Results for orders placed or performed during the hospital encounter of 10/05/23 (from the past 672 hour(s))   COMPREHENSIVE METABOLIC PANEL, NON-FASTING    Collection Time: 10/05/23 10:38 PM   Result Value Ref Range    SODIUM 140 136 - 145 mmol/L    POTASSIUM 3.9 3.5 - 5.1 mmol/L    CHLORIDE 102 98 - 107 mmol/L    CO2 TOTAL 23 21 - 32 mmol/L    ANION GAP 15 (H) 4 - 13 mmol/L    BUN 12 7 - 18 mg/dL    CREATININE 7.82 (H) 0.55 - 1.02 mg/dL    BUN/CREA RATIO 12     ESTIMATED GFR 68 >59 mL/min/1.19m^2    ALBUMIN 4.1 3.4 - 5.0 g/dL    CALCIUM  9.9 8.5 - 10.1 mg/dL    GLUCOSE 811 (H) 74 - 106 mg/dL    ALKALINE PHOSPHATASE 87 46 - 116 U/L    ALT (SGPT) 35 <=78 U/L    AST (SGOT) 31 15 - 37 U/L    BILIRUBIN TOTAL 0.8 0.2 - 1.0 mg/dL    PROTEIN TOTAL 7.8 6.4 - 8.2 g/dL    ALBUMIN/GLOBULIN RATIO 1.1 0.8 - 1.4    OSMOLALITY, CALCULATED 281 270 - 290 mOsm/kg    CALCIUM, CORRECTED 9.8 mg/dL    GLOBULIN 3.7    CBC WITH DIFF    Collection Time: 10/05/23 10:38 PM   Result Value Ref Range    WBC 9.6 4.0 - 10.5 x10^3/uL    RBC 5.01 4.20 - 5.40 x10^6/uL    HGB 14.7 12.5 - 16.0 g/dL     HCT 91.4 78.2 - 95.6 %    MCV 87.0 78.0 - 99.0 fL    MCH 29.4 27.0 - 32.0 pg    MCHC 33.7 32.0 - 36.0 g/dL    RDW 21.3 (H) 08.6 - 14.8 %    PLATELETS 291 140 - 440 x10^3/uL    MPV 7.8 7.4 - 10.4 fL    NEUTROPHIL % 73 40 - 76 %    LYMPHOCYTE % 21 (L) 25 - 45 %    MONOCYTE % 5 0 - 12 %    EOSINOPHIL % 1 1 - 7 %    BASOPHIL % 0 0 - 3 %    NEUTROPHIL # 6.95 1.80 - 8.40 x10^3/uL    LYMPHOCYTE # 2.01 1.10 - 5.00 x10^3/uL    MONOCYTE # 0.49 0.00 - 1.30 x10^3/uL    EOSINOPHIL # 0.10 0.00 - 0.80 x10^3/uL    BASOPHIL # 0.02 0.00 - 0.30 x10^3/uL   Results for orders placed or performed during the hospital encounter of 10/04/23 (from the past 672 hour(s))   COMPREHENSIVE METABOLIC PANEL, NON-FASTING    Collection Time: 10/04/23  8:04 PM   Result Value Ref Range    SODIUM 139 136 - 145 mmol/L    POTASSIUM 4.5 3.5 - 5.1 mmol/L    CHLORIDE 102 98 - 107 mmol/L    CO2 TOTAL 29 21 - 32 mmol/L    ANION GAP 8 4 - 13 mmol/L    BUN 10 7 - 18 mg/dL    CREATININE 5.78 4.69 - 1.02 mg/dL    BUN/CREA RATIO 10     ESTIMATED GFR 70 >59 mL/min/1.29m^2    ALBUMIN 3.9 3.4 - 5.0 g/dL    CALCIUM 9.1 8.5 - 62.9 mg/dL    GLUCOSE 99 74 - 528 mg/dL    ALKALINE PHOSPHATASE 83 46 - 116 U/L    ALT (SGPT) 36 <=78 U/L    AST (SGOT) 22 15 - 37 U/L    BILIRUBIN TOTAL 0.5 0.2 - 1.0 mg/dL    PROTEIN TOTAL 7.3 6.4 - 8.2 g/dL    ALBUMIN/GLOBULIN RATIO 1.1 0.8 - 1.4    OSMOLALITY, CALCULATED 277 270 - 290 mOsm/kg    CALCIUM, CORRECTED 9.2 mg/dL    GLOBULIN 3.4    LIPASE    Collection Time: 10/04/23  8:04 PM   Result Value Ref Range    LIPASE 26 16 - 77 U/L          Result Value Ref Range    LACTIC ACID 0.8 0.4 - 2.0 mmol/L   CBC WITH DIFF    Collection Time: 10/04/23  8:04 PM  Result Value Ref Range    WBC 9.6 4.0 - 10.5 x10^3/uL    RBC 4.76 4.20 - 5.40 x10^6/uL    HGB 14.1 12.5 - 16.0 g/dL    HCT 16.1 09.6 - 04.5 %    MCV 87.2 78.0 - 99.0 fL    MCH 29.6 27.0 - 32.0 pg    MCHC 34.0 32.0 - 36.0 g/dL    RDW 40.9 (H) 81.1 - 14.8 %    PLATELETS 269 140 - 440  x10^3/uL    MPV 7.7 7.4 - 10.4 fL    NEUTROPHIL % 67 40 - 76 %    LYMPHOCYTE % 24 (L) 25 - 45 %    MONOCYTE % 6 0 - 12 %    EOSINOPHIL % 2 1 - 7 %    BASOPHIL % 0 0 - 3 %    NEUTROPHIL # 6.50 1.80 - 8.40 x10^3/uL    LYMPHOCYTE # 2.34 1.10 - 5.00 x10^3/uL    MONOCYTE # 0.59 0.00 - 1.30 x10^3/uL    EOSINOPHIL # 0.18 0.00 - 0.80 x10^3/uL    BASOPHIL # 0.03 0.00 - 0.30 x10^3/uL                   No follow-ups on file.  Orders Placed This Encounter    URINALYSIS, MACROSCOPIC AND MICROSCOPIC W/CULTURE REFLEX    URINALYSIS, MACROSCOPIC    URINALYSIS, MICROSCOPIC    metoclopramide HCl (REGLAN) 5 mg Oral Tablet           Reason for visit: Follow Up (Routine follow up)      History of Present Illness:  REGINIA SLIGHT is a 47 y.o. female presents in follow up for chronic disease management    Follows with urology at [place]. Has hx of nephrolithiasis. Had 24 hour urine.    Wednesday has bone density  Dec 13 has epidural injection with specialist    Lavera Guise is helping low back pain and she is tolerating it with use of the Lyrica!Marland Kitchen Lyrica makes her sleepy early in am.    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    Knee pain, right    Obesity (BMI 35.0-39.9 without comorbidity)    Borderline diabetes    Irritable bowel syndrome with constipation    DUB (dysfunctional uterine bleeding)    Gastroparesis    Hyperlipidemia LDL goal <70    Insomnia    Vitamin D deficiency    Paraspinal muscle spasm    Migraine    GERD (gastroesophageal reflux disease)    Pelvic pain    Menorrhagia with irregular cycle    S/P Nissen fundoplication (without gastrostomy tube) procedure    Vaginal discharge    Fibroids, intramural    Menopausal symptoms    Hair loss    Irregular periods/menstrual cycles    Kidney stone    Chronic right-sided low back pain        Historical Data     Past Medical History:  Past Medical History:   Diagnosis Date    Body mass index 35.0-35.9, adult     Borderline diabetes     Chronic low back pain     Dizziness     DUB (dysfunctional uterine bleeding)     Gastroparesis     GERD (  gastroesophageal reflux disease)     Hyperglycemia     Hyperlipidemia LDL goal <70     Insomnia     Knee pain, bilateral     Migraine     Paraspinal muscle spasm     Vitamin D deficiency          Past Surgical History:  Past Surgical History:   Procedure Laterality Date    CESAREAN SECTION  2004    COLONOSCOPY      Dr. Lequita Halt in [place]    ENDOMETRIAL ABLATION W/ NOVASURE      dr brodnik 12/14/22    ESOPHAGOSCOPY / EGD      HX APPENDECTOMY      HX BACK SURGERY      november 2019    HX LAP CHOLECYSTECTOMY      LAPAROSCOPIC TUBAL LIGATION  07/30/2022    with [facility]&C brodmik    NISSEN FUNDOPLICATION  08/13/2022    had in [place]         Allergies:  Allergies   Allergen Reactions    Other Rash     Allergic to the sun      Medications:  albuterol sulfate (PROVENTIL OR VENTOLIN OR PROAIR) 90 mcg/actuation Inhalation oral inhaler, Take 2 Puffs by inhalation Every 6 hours as needed  bethanechol chloride (URECHOLINE) 10 mg Oral Tablet, Take 1 Tablet (10 mg total) by mouth Twice daily  Biotin 10 mg Oral Tablet, Take 1 Tablet (10 mg total) by mouth Once a day  buprenorphine (BUTRANS) 15 mcg/hour Transdermal Patch Weekly, Place 1 Patch on the skin Every 7 days Indications: severe chronic pain with opioid tolerance  cetirizine (ZYRTEC) 10 mg Oral Tablet, Take 1 Tablet (10 mg total) by mouth Once a day  cholecalciferol, vitamin D3, 1,250 mcg (50,000 unit) Oral Capsule, Take 1 Capsule (50,000 Units total) by mouth Every 7 days  Colestipol (COLESTID) 1 gram Oral Tablet, Take 2 Tablets (2 g total) by mouth Once a day Takes two in am  cyclobenzaprine (FLEXERIL) 10 mg Oral Tablet, Take 1 Tablet (10 mg total) by mouth Twice daily  famotidine (PEPCID) 40 mg Oral Tablet, Take 1 Tablet (40 mg total) by mouth Every  evening  linaCLOtide (LINZESS) 145 mcg Oral Capsule, Take 1 Capsule (145 mcg total) by mouth Every morning  naloxone (NARCAN) 4 mg per spray nasal spray, 1 Spray by INTRANASAL route Every 2 minutes as needed for actual or suspected opioid overdose. Call 911 if used.  omeprazole (PRILOSEC) 40 mg Oral Capsule, Delayed Release(E.C.), Take 1 Capsule (40 mg total) by mouth Twice daily  phentermine (ADIPEX-P) 37.5 mg Oral Tablet, Take 1 Tablet (37.5 mg total) by mouth Every morning before breakfast  pregabalin (LYRICA) 200 mg Oral Capsule, Take 1 Capsule (200 mg total) by mouth Three times a day  QUEtiapine (SEROQUEL) 100 mg Oral Tablet, Take 1 Tablet (100 mg total) by mouth Twice daily  rosuvastatin (CRESTOR) 5 mg Oral Tablet, Take 1 Tablet (5 mg total) by mouth Once a day  venlafaxine (EFFEXOR XR) 75 mg Oral Capsule, Sust. Release 24 hr, Take 1 Capsule (75 mg total) by mouth Once a day  levoFLOXacin (LEVAQUIN) 500 mg Oral Tablet, Take 1 Tablet (500 mg total) by mouth Every 24 hours (Patient not taking: Reported on 10/24/2023)  metoclopramide HCl (REGLAN) 5 mg Oral Tablet, Take 1 Tablet (5 mg total) by mouth Once a day Patient taking twice a day  nitrofurantoin monohyd/m-cryst (MACROBID) 100 mg Oral Capsule, Take 1 Capsule (100 mg  total) by mouth Twice daily for 7 days  ondansetron (ZOFRAN ODT) 4 mg Oral Tablet, Rapid Dissolve, Take 1 Tablet (4 mg total) by mouth Every 8 hours as needed for Nausea/Vomiting (Patient not taking: Reported on 10/10/2023)    No facility-administered medications prior to visit.     Family History:  Family Medical History:       Problem Relation (Age of Onset)    COPD Father    Coronary Artery Disease Brother    Diabetes type II Father    Hypertension (High Blood Pressure) Mother, Father    Kidney Disease Father    No Known Problems Half-Sister, Half-Brother, Maternal Aunt, Maternal Uncle, Paternal Aunt, Paternal Uncle, Maternal Grandmother, Maternal Grandfather, Paternal Grandmother, Paternal  Grandfather, Daughter, Son    Throat cancer Sister            Social History:  Social History     Socioeconomic History    Marital status: Widowed    Number of children: 1    Years of education: 12+    Highest education level: Associate degree: occupational, Scientist, product/process development, or vocational program   Tobacco Use    Smoking status: Never    Smokeless tobacco: Never   Vaping Use    Vaping status: Never Used   Substance and Sexual Activity    Alcohol use: Never    Drug use: Never    Sexual activity: Yes     Partners: Male     Birth control/protection: None     Social Determinants of Health     Financial Resource Strain: Low Risk  (12/29/2022)    Financial Resource Strain     SDOH Financial: No   Transportation Needs: Low Risk  (12/29/2022)    Transportation Needs     SDOH Transportation: No   Social Connections: Low Risk  (12/29/2022)    Social Connections     SDOH Social Isolation: 5 or more times a week   Intimate Partner Violence: Low Risk  (12/29/2022)    Intimate Partner Violence     SDOH Domestic Violence: No   Housing Stability: Low Risk  (12/29/2022)    Housing Stability     SDOH Housing Situation: I have housing.     SDOH Housing Worry: No           Review of Systems:  Any pertinent Review of Systems as addressed in the HPI above.    Physical Exam:  Vital Signs:  Vitals:    10/24/23 0957   BP: 110/70   Pulse: 82   Temp: 37.5 C (99.5 F)   TempSrc: Tympanic   SpO2: 97%   Weight: 113 kg (249 lb)   Height: 1.727 m (5\' 8" )   BMI: 37.86     Physical Exam  Vitals reviewed.   Constitutional:       General: She is not in acute distress.     Appearance: Normal appearance.   HENT:      Head: Normocephalic and atraumatic.      Right Ear: There is no impacted cerumen.      Left Ear: There is no impacted cerumen.      Nose: No rhinorrhea.      Mouth/Throat:      Pharynx: Posterior oropharyngeal erythema present. No oropharyngeal exudate.   Eyes:      General:         Right eye: No discharge.         Left eye: No discharge.   Neck:  Vascular: No carotid bruit.   Cardiovascular:      Rate and Rhythm: Normal rate and regular rhythm.      Pulses: Normal pulses.      Heart sounds: Normal heart sounds.   Pulmonary:      Effort: Pulmonary effort is normal.      Breath sounds: Normal breath sounds.   Abdominal:      Palpations: Abdomen is soft.      Tenderness: There is no abdominal tenderness.   Musculoskeletal:         General: Tenderness present.      Right lower leg: No edema.      Left lower leg: No edema.   Lymphadenopathy:      Cervical: No cervical adenopathy.   Skin:     General: Skin is warm and dry.      Capillary Refill: Capillary refill takes less than 2 seconds.   Neurological:      General: No focal deficit present.      Mental Status: She is alert and oriented to person, place, and time.      Comments: Antalgic gait   Psychiatric:         Mood and Affect: Mood normal.         Behavior: Behavior normal.         Thought Content: Thought content normal.         Judgment: Judgment normal.          Weyman Pedro, DO     Portions of this note may be dictated using voice recognition software or a dictation service. Variances in spelling and vocabulary are possible and unintentional. Not all errors are caught/corrected. Please notify the Thereasa Parkin if any discrepancies are noted or if the meaning of any statement is not clear.

## 2023-10-24 NOTE — Addendum Note (Signed)
Addended by: Ralene Bathe on: 10/24/2023 05:01 PM     Modules accepted: Orders

## 2023-10-27 LAB — URINE CULTURE,ROUTINE: URINE CULTURE: 50000 — AB

## 2023-10-31 ENCOUNTER — Inpatient Hospital Stay
Admission: RE | Admit: 2023-10-31 | Discharge: 2023-10-31 | Disposition: A | Discharge status: Home / Self Care | Payer: MEDICAID | Source: Ambulatory Visit | Attending: Family | Admitting: Family

## 2023-10-31 ENCOUNTER — Other Ambulatory Visit: Payer: Self-pay

## 2023-10-31 ENCOUNTER — Ambulatory Visit (RURAL_HEALTH_CENTER): Payer: MEDICAID | Attending: Family | Admitting: Family

## 2023-10-31 ENCOUNTER — Encounter (RURAL_HEALTH_CENTER): Payer: Self-pay | Admitting: Family

## 2023-10-31 VITALS — BP 129/81 | HR 82 | Temp 97.2°F | Resp 18 | Ht 68.0 in | Wt 250.5 lb

## 2023-10-31 DIAGNOSIS — M25472 Effusion, left ankle: Secondary | ICD-10-CM | POA: Insufficient documentation

## 2023-10-31 DIAGNOSIS — L853 Xerosis cutis: Secondary | ICD-10-CM | POA: Insufficient documentation

## 2023-10-31 DIAGNOSIS — M79642 Pain in left hand: Secondary | ICD-10-CM | POA: Insufficient documentation

## 2023-10-31 DIAGNOSIS — M79641 Pain in right hand: Secondary | ICD-10-CM | POA: Insufficient documentation

## 2023-10-31 DIAGNOSIS — M25471 Effusion, right ankle: Secondary | ICD-10-CM | POA: Insufficient documentation

## 2023-10-31 MED ORDER — CLOBETASOL-EMOLLIENT 0.05 % TOPICAL CREAM
TOPICAL_CREAM | Freq: Two times a day (BID) | CUTANEOUS | 0 refills | Status: AC
Start: 2023-10-31 — End: 2023-11-14

## 2023-10-31 NOTE — Assessment & Plan Note (Signed)
Will get bilateral hand xrays just to r/o any arthritic swelling or significant changes.   It appears patient had an ANA drawn in August that was neg, and her recent CBC on Nov 21st did not indicate any inflammation or infection.

## 2023-10-31 NOTE — Assessment & Plan Note (Addendum)
He discomfort in her hands is most likely the result of significant dry and irritation from chemical and water exposure, that's worsened since it gotten cold out side.   I will prescribe steroid cream.  I have asked her to wear gloves and avoid exposing her hands to water or chemicals.   She needs to apply the Cera Ve or and Intense moisturizing lotion to her skin at least twice a day, to heal and promote health skin.

## 2023-10-31 NOTE — Assessment & Plan Note (Addendum)
Suggested wearing her compression Stocking, which would help with her ankle edema.   And elevating her feet at night.

## 2023-10-31 NOTE — Nursing Note (Signed)
Patient here today with complaints of joint pain in elbow and knees.

## 2023-10-31 NOTE — Progress Notes (Signed)
FAMILY MEDICINE, [facility] FAMILY MEDICINE [facility]  [address]  [place] [address]  Operated by [facility]     Name: Lindsay Crawford MRN:  [id number]   Date of Birth: [birth year] Age: 47 y.o.   Date: 10/31/2023  Time: 16:19     Provider: Asa Lente, FNP    Reason for visit: Joint Pain (Knee and elbow pain. )      History of Present Illness:  Lindsay Crawford is a 47 y.o. female presents to the clinic with c/o pain in her knees, ankles and hands swelling.  She tells me this has been going on for the past couple of weeks. She has a pulling sensation in her fingers between the 2nd and 3rd distal joints, when she squeezes her finger. She pain is not in the joint. She tells me her fingers are swollen. They had to remove her rings the other day due to the swelling. The skin on her hands is significantly dry and cracking. 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 And she does not use gloves while cleaning.   She also noticed swelling in her ankles that uncomfortable also.   She see ortho for her knee pain and she had shots in her hips for bursitis recently.   She tells me her uncomfortable hands prompted her visit, along with the swelling.         Patient Active Problem List    Diagnosis Date Noted    Bilateral hand pain 10/31/2023    Xerosis cutis 10/31/2023    Ankle edema, bilateral 10/31/2023    Chronic right-sided low back pain 09/14/2023    Kidney stone 05/11/2023    Irregular periods/menstrual cycles 02/07/2023    Menopausal symptoms 12/29/2022    Hair loss 12/29/2022    Vaginal discharge 11/11/2022    Fibroids, intramural 11/11/2022    S/P Nissen fundoplication (without gastrostomy tube) procedure 08/25/2022    Pelvic pain 07/27/2022    Menorrhagia with irregular cycle 07/27/2022    DUB (dysfunctional uterine bleeding) 05/25/2022    Gastroparesis 05/25/2022    Hyperlipidemia LDL  goal <70 05/25/2022    Insomnia 05/25/2022    Vitamin D deficiency 05/25/2022    Paraspinal muscle spasm 05/25/2022    Migraine 05/25/2022    GERD (gastroesophageal reflux disease) 05/25/2022    Knee pain, right 02/03/2022    Obesity (BMI 35.0-39.9 without comorbidity) 02/03/2022    Borderline diabetes 02/03/2022    Irritable bowel syndrome with constipation 02/03/2022    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 Lequita Halt in [place]    ENDOMETRIAL ABLATION W/ NOVASURE      dr brodnik 12/14/22    ESOPHAGOSCOPY / EGD      HX APPENDECTOMY      HX BACK SURGERY      november 2019    HX LAP CHOLECYSTECTOMY      LAPAROSCOPIC TUBAL LIGATION  07/30/2022    with D&C brodmik    NISSEN FUNDOPLICATION  08/13/2022    had in [place]     Allergies:  Allergies   Allergen Reactions    Other Rash     Allergic to the sun      Medications:  Current Outpatient Medications   Medication Sig    albuterol sulfate (PROVENTIL OR VENTOLIN OR PROAIR) 90 mcg/actuation Inhalation oral inhaler Take 2 Puffs by inhalation Every 6 hours as needed    bethanechol chloride (URECHOLINE) 10 mg Oral Tablet Take 1 Tablet (10 mg total) by mouth Twice daily    Biotin 10 mg Oral Tablet Take 1 Tablet (10 mg total) by mouth Once a day    buprenorphine (BUTRANS) 15 mcg/hour Transdermal Patch Weekly Place 1 Patch on the skin Every 7 days Indications: severe chronic pain with opioid  tolerance    cetirizine (ZYRTEC) 10 mg Oral Tablet Take 1 Tablet (10 mg total) by mouth Once a day    cholecalciferol, vitamin D3, 1,250 mcg (50,000 unit) Oral Capsule Take 1 Capsule (50,000 Units total) by mouth Every 7 days    clobetasoL-emollient (TEMOVATE E) 0.05 % Cream Apply topically Twice daily for 14 days Xerosis Cutitis    Colestipol (COLESTID) 1 gram Oral Tablet Take 2 Tablets (2 g total) by mouth Once a day Takes two in am    cyclobenzaprine (FLEXERIL) 10 mg Oral Tablet Take 1 Tablet (10 mg total) by mouth Twice daily    famotidine (PEPCID) 40 mg Oral Tablet Take 1 Tablet (40 mg total) by mouth Every evening    linaCLOtide (LINZESS) 145 mcg Oral Capsule Take 1 Capsule (145 mcg total) by mouth Every morning    metoclopramide HCl (REGLAN) 5 mg Oral Tablet Take 1 Tablet (5 mg total) by mouth Three times a day Patient taking twice a day    naloxone (NARCAN) 4 mg per spray nasal spray 1 Spray by INTRANASAL route Every 2 minutes as needed for actual or suspected opioid overdose. Call 911 if used.    omeprazole (PRILOSEC) 40 mg Oral Capsule, Delayed Release(E.C.) Take 1 Capsule (40 mg total) by mouth Twice daily    phentermine (ADIPEX-P) 37.5 mg Oral Tablet Take 1 Tablet (37.5 mg total) by mouth Every morning before breakfast    pregabalin (LYRICA) 200 mg Oral Capsule Take 1 Capsule (200 mg total) by mouth Three times a day    QUEtiapine (SEROQUEL) 100 mg Oral Tablet Take 1 Tablet (100 mg total) by mouth Twice daily    rosuvastatin (CRESTOR) 5 mg Oral Tablet Take 1 Tablet (5 mg total) by mouth Once a day    venlafaxine (EFFEXOR XR) 75 mg Oral Capsule, Sust. Release 24 hr Take 1 Capsule (75 mg total) by mouth Once a day     Family History:  Family Medical History:       Problem Relation (Age of Onset)    COPD Father    Coronary Artery Disease Brother    Diabetes type II Father    Hypertension (High Blood Pressure) Mother, Father    Kidney Disease Father  No Known Problems Half-Sister, Half-Brother, Maternal  Aunt, Maternal Uncle, Paternal Aunt, Paternal Uncle, Maternal Grandmother, Maternal Grandfather, Paternal Grandmother, Paternal Grandfather, Daughter, Son    Throat cancer Sister            Social History:  Social History     Socioeconomic History    Marital status: Widowed    Number of children: 1    Years of education: 12+    Highest education level: Associate degree: occupational, Scientist, product/process development, or vocational program   Tobacco Use    Smoking status: Never    Smokeless tobacco: Never   Vaping Use    Vaping status: Never Used   Substance and Sexual Activity    Alcohol use: Never    Drug use: Never    Sexual activity: Yes     Partners: Male     Birth control/protection: None     Social Determinants of Health     Financial Resource Strain: Low Risk  (12/29/2022)    Financial Resource Strain     SDOH Financial: No   Transportation Needs: Low Risk  (12/29/2022)    Transportation Needs     SDOH Transportation: No   Social Connections: Low Risk  (12/29/2022)    Social Connections     SDOH Social Isolation: 5 or more times a week   Intimate Partner Violence: Low Risk  (12/29/2022)    Intimate Partner Violence     SDOH Domestic Violence: No   Housing Stability: Low Risk  (12/29/2022)    Housing Stability     SDOH Housing Situation: I have housing.     SDOH Housing Worry: No           Review of Systems:  Any pertinent Review of Systems as addressed in the HPI above.    Physical Exam:  Vital Signs:  Vitals:    10/31/23 1449   BP: 129/81   Pulse: 82   Resp: 18   Temp: 36.2 C (97.2 F)   TempSrc: Temporal   SpO2: 97%   Weight: 114 kg (250 lb 8 oz)   Height: 1.727 m (5\' 8" )   BMI: 38.09     Physical Exam  Constitutional:       Appearance: Normal appearance. She is not ill-appearing.   Eyes:      General: No scleral icterus.     Conjunctiva/sclera: Conjunctivae normal.   Cardiovascular:      Rate and Rhythm: Normal rate and regular rhythm.   Pulmonary:      Effort: Pulmonary effort is normal.      Breath sounds: Normal breath sounds. No  wheezing, rhonchi or rales.   Musculoskeletal:      Right lower leg: 1+ Edema present.      Left lower leg: 1+ Edema present.   Skin:     General: Skin is cool.      Comments: In general her skin is cool and dry.   Especially her hands and arms.  Cracking is noted in these areas.     No joint swelling or erythema noted     Neurological:      Mental Status: She is alert and oriented to person, place, and time. Mental status is at baseline.          Assessment/Plan:  (M79.641,  M79.642) Bilateral hand pain  (primary encounter diagnosis)  Plan: XR HANDS BILATERAL 2 VIEW    (L85.3) Xerosis cutis    (M25.471,  M25.472) Ankle edema, bilateral  Problem List Items Addressed This Visit          Musculoskeletal    Bilateral hand pain - Primary     Will get bilateral hand xrays just to r/o any arthritic swelling or significant changes.   It appears patient had an ANA drawn in August that was neg, and her recent CBC on Nov 21st did not indicate any inflammation or infection.            Relevant Orders    XR HANDS BILATERAL 2 VIEW    Ankle edema, bilateral     Suggested wearing her compression Stocking, which would help with her ankle edema.   And elevating her feet at night.             Dermatology    Xerosis cutis     He discomfort in her hands is most likely the result of significant dry and irritation from chemical and water exposure, that's worsened since it gotten cold out side.   I will prescribe steroid cream.  I have asked her to wear gloves and avoid exposing her hands to water or chemicals.   She needs to apply the Cera Ve or and Intense moisturizing lotion to her skin at least twice a day, to heal and promote health skin.             Orders Placed This Encounter    XR HANDS BILATERAL 2 VIEW    clobetasoL-emollient (TEMOVATE E) 0.05 % Cream     On the day of the encounter, a total of 35 minutes was spent on this patient encounter including review of historical information, examination, documentation and post-visit  activities. The time documented excludes procedural time.      Return if symptoms worsen or fail to improve.    Amy L Harrup, FNP-C       I reviewed the documentation of the visit provided by the certified Nurse Practitioner and agree with her medical decision making, Weyman Pedro, D.O.     Portions of this note may be dictated using voice recognition software or a dictation service. Variances in spelling and vocabulary are possible and unintentional. Not all errors are caught/corrected. Please notify the Thereasa Parkin if any discrepancies are noted or if the meaning of any statement is not clear.

## 2023-10-31 NOTE — Result Encounter Note (Signed)
Let Lindsay Crawford know her hand Xrays were normal.   I feel pretty confident that her discomfort is coming from her dry and  inflamed skin.   Tell her to start the steriod cream and keep those hands protected and moisturized.   Lindsay Crawford

## 2023-11-01 ENCOUNTER — Telehealth (RURAL_HEALTH_CENTER): Payer: Self-pay | Admitting: Family Medicine

## 2023-11-01 NOTE — Result Encounter Note (Signed)
Lindsay Haber, MA  Karcyn Menn, Bryson Ha, FNP  Called and informed patient about x-ray results and recommendations. Patient states that she understands and she will let us know if she has any questions or concerns she will let us know.

## 2023-11-02 LAB — IMMUNOGLOBULIN PROFILE (IGA, IGG, AND IGM), SERUM
IMMUNOGLOBULIN A (IGA): 158 mg/dL (ref 85–499)
IMMUNOGLOBULIN G (IGG): 897 mg/dL (ref 610–1616)
IMMUNOGLOBULIN M (IGM): 205 mg/dL (ref 35–242)

## 2023-11-28 ENCOUNTER — Other Ambulatory Visit: Payer: Self-pay

## 2023-11-28 ENCOUNTER — Encounter (RURAL_HEALTH_CENTER): Payer: Self-pay | Admitting: Family

## 2023-11-28 ENCOUNTER — Ambulatory Visit (RURAL_HEALTH_CENTER): Payer: MEDICAID | Attending: Family | Admitting: Family

## 2023-11-28 VITALS — BP 122/76 | HR 85 | Temp 98.0°F | Resp 18 | Ht 68.0 in | Wt 249.4 lb

## 2023-11-28 DIAGNOSIS — S61219A Laceration without foreign body of unspecified finger without damage to nail, initial encounter: Secondary | ICD-10-CM

## 2023-11-28 DIAGNOSIS — W260XXA Contact with knife, initial encounter: Secondary | ICD-10-CM | POA: Insufficient documentation

## 2023-11-28 DIAGNOSIS — Z6837 Body mass index (BMI) 37.0-37.9, adult: Secondary | ICD-10-CM | POA: Insufficient documentation

## 2023-11-28 DIAGNOSIS — S61210A Laceration without foreign body of right index finger without damage to nail, initial encounter: Secondary | ICD-10-CM | POA: Insufficient documentation

## 2023-11-28 NOTE — Nursing Note (Signed)
Patient here today with complaints of her finger being cut. Patient cut her finger while cutting flower.

## 2023-11-28 NOTE — Progress Notes (Signed)
FAMILY MEDICINE, [facility] FAMILY MEDICINE [facility]  [address]  [place] [address]  Operated by [facility]     Name: Lindsay Crawford MRN:  [id number]   Date of Birth: Nov 23, 1976 Age: 47 y.o.   Date: 11/28/2023  Time: 15:20     Provider: Asa Lente, FNP    Reason for visit: Finger Injury      History of Present Illness:  Lindsay Crawford is a 47 y.o. female presents to the clinic with c/o a cut to her R index finger. She tells me she was using a filet knife to cut the steams on flowers. This happened around 11 am. She tells me that it was a clean knife.    She washed the finger with proxxide and held pressure.   She can not recall her last tetanus.       Patient Active Problem List    Diagnosis Date Noted    Bilateral hand pain 10/31/2023    Xerosis cutis 10/31/2023    Ankle edema, bilateral 10/31/2023    Chronic right-sided low back pain 09/14/2023    Kidney stone 05/11/2023    Irregular periods/menstrual cycles 02/07/2023    Menopausal symptoms 12/29/2022    Hair loss 12/29/2022    Vaginal discharge 11/11/2022    Fibroids, intramural 11/11/2022    S/P Nissen fundoplication (without gastrostomy tube) procedure 08/25/2022    Pelvic pain 07/27/2022    Menorrhagia with irregular cycle 07/27/2022    DUB (dysfunctional uterine bleeding) 05/25/2022    Gastroparesis 05/25/2022    Hyperlipidemia LDL goal <70 05/25/2022    Insomnia 05/25/2022    Vitamin D deficiency 05/25/2022    Paraspinal muscle spasm 05/25/2022    Migraine 05/25/2022    GERD (gastroesophageal reflux disease) 05/25/2022    Knee pain, right 02/03/2022    Obesity (BMI 35.0-39.9 without comorbidity) 02/03/2022    Borderline diabetes 02/03/2022    Irritable bowel syndrome with constipation 02/03/2022    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 Lequita Halt in [place]    ENDOMETRIAL ABLATION W/ NOVASURE      dr brodnik 12/14/22    ESOPHAGOSCOPY / EGD      HX APPENDECTOMY      HX BACK SURGERY      november 2019    HX LAP CHOLECYSTECTOMY      LAPAROSCOPIC TUBAL LIGATION  07/30/2022    with D&C brodmik    NISSEN FUNDOPLICATION  08/13/2022    had in [place]     Allergies:  Allergies   Allergen Reactions    Other Rash     Allergic  to the sun      Medications:  Current Outpatient Medications   Medication Sig    albuterol sulfate (PROVENTIL OR VENTOLIN OR PROAIR) 90 mcg/actuation Inhalation oral inhaler Take 2 Puffs by inhalation Every 6 hours as needed    bethanechol chloride (URECHOLINE) 10 mg Oral Tablet Take 1 Tablet (10 mg total) by mouth Twice daily    Biotin 10 mg Oral Tablet Take 1 Tablet (10 mg total) by mouth Once a day    buprenorphine (BUTRANS) 15 mcg/hour Transdermal Patch Weekly Place 1 Patch on the skin Every 7 days Indications: severe chronic pain with opioid tolerance    cetirizine (ZYRTEC) 10 mg Oral Tablet Take 1 Tablet (10 mg total) by mouth Once a day    cholecalciferol, vitamin D3, 1,250 mcg (50,000 unit) Oral Capsule Take 1 Capsule (50,000 Units total) by mouth Every 7 days    Colestipol (COLESTID) 1 gram Oral Tablet Take 2 Tablets (2 g total) by mouth Once a day Takes two in am    cyclobenzaprine (FLEXERIL) 10 mg Oral Tablet Take 1 Tablet (10 mg total) by mouth Twice daily    famotidine (PEPCID) 40 mg Oral Tablet Take 1 Tablet (40 mg total) by mouth Every evening    linaCLOtide (LINZESS) 145 mcg Oral Capsule Take 1 Capsule (145 mcg total) by  mouth Every morning    metoclopramide HCl (REGLAN) 5 mg Oral Tablet Take 1 Tablet (5 mg total) by mouth Three times a day Patient taking twice a day    naloxone (NARCAN) 4 mg per spray nasal spray 1 Spray by INTRANASAL route Every 2 minutes as needed for actual or suspected opioid overdose. Call 911 if used.    omeprazole (PRILOSEC) 40 mg Oral Capsule, Delayed Release(E.C.) Take 1 Capsule (40 mg total) by mouth Twice daily    phentermine (ADIPEX-P) 37.5 mg Oral Tablet Take 1 Tablet (37.5 mg total) by mouth Every morning before breakfast    pregabalin (LYRICA) 200 mg Oral Capsule Take 1 Capsule (200 mg total) by mouth Three times a day    QUEtiapine (SEROQUEL) 100 mg Oral Tablet Take 1 Tablet (100 mg total) by mouth Twice daily    rosuvastatin (CRESTOR) 5 mg Oral Tablet Take 1 Tablet (5 mg total) by mouth Once a day    venlafaxine (EFFEXOR XR) 75 mg Oral Capsule, Sust. Release 24 hr Take 1 Capsule (75 mg total) by mouth Once a day     Family History:  Family Medical History:       Problem Relation (Age of Onset)    COPD Father    Coronary Artery Disease Brother    Diabetes type II Father    Hypertension (High Blood Pressure) Mother, Father    Kidney Disease Father    No Known Problems Half-Sister, Half-Brother, Maternal Aunt, Maternal Uncle, Paternal Aunt, Paternal Uncle, Maternal Grandmother, Maternal Grandfather, Paternal Grandmother, Paternal Grandfather, Daughter, Son    Throat cancer Sister            Social History:  Social History     Socioeconomic History    Marital status: Widowed    Number of children: 1    Years of education: 12+    Highest education level: Associate degree: occupational, Scientist, product/process development, or vocational program   Tobacco Use    Smoking status: Never    Smokeless tobacco: Never   Vaping Use    Vaping status: Never Used   Substance and Sexual Activity    Alcohol use: Never  Drug use: Never    Sexual activity: Yes     Partners: Male     Birth control/protection: None     Social Determinants of  Health     Financial Resource Strain: Low Risk  (12/29/2022)    Financial Resource Strain     SDOH Financial: No   Transportation Needs: Low Risk  (12/29/2022)    Transportation Needs     SDOH Transportation: No   Social Connections: Low Risk  (12/29/2022)    Social Connections     SDOH Social Isolation: 5 or more times a week   Intimate Partner Violence: Low Risk  (12/29/2022)    Intimate Partner Violence     SDOH Domestic Violence: No   Housing Stability: Low Risk  (12/29/2022)    Housing Stability     SDOH Housing Situation: I have housing.     SDOH Housing Worry: No           Review of Systems:  Any pertinent Review of Systems as addressed in the HPI above.    Physical Exam:  Vital Signs:  Vitals:    11/28/23 1440   BP: 122/76   Pulse: 85   Resp: 18   Temp: 36.7 C (98 F)   TempSrc: Temporal   SpO2: 98%   Weight: 113 kg (249 lb 6 oz)   Height: 1.727 m (5\' 8" )   BMI: 37.92     Physical Exam  Constitutional:       General: She is not in acute distress.     Appearance: Normal appearance. She is not ill-appearing.   Cardiovascular:      Rate and Rhythm: Normal rate.   Pulmonary:      Effort: Pulmonary effort is normal.   Skin:     Findings: Laceration present.      Comments: See photo graph.    Neurological:      Mental Status: She is alert and oriented to person, place, and time. Mental status is at baseline.          Assessment/Plan:  (U98.119J) Finger laceration, initial encounter  (primary encounter diagnosis)     Cleaned with alcohol wipes and assessed well. No bleeding noted.   Tincture of benzoin applied with steri strips to hold well approximated.   Then, covered with non adhesive dressing and secured with tape.   Patient instructed to keep covered and dry until day after tomorrow- then uncover, leave steri strips, but allow to air until healed.   She can clean cut gently with soap and water, pat dry, once uncovered. Protect from getting cut soiled or exposed to chemicals. Steri strips will fall off.     Orders  Placed This Encounter    Tdap Vaccine - Boostrix - >=10 yrs      Notify provider if cut becomes red or inflamed, starts to have excessive drainage or bleeding.        Return if symptoms worsen or fail to improve.    Chadley Dziedzic L Therese Rocco, FNP-C    Portions of this note may be dictated using voice recognition software or a dictation service. Variances in spelling and vocabulary are possible and unintentional. Not all errors are caught/corrected. Please notify the Thereasa Parkin if any discrepancies are noted or if the meaning of any statement is not clear.

## 2023-12-20 ENCOUNTER — Other Ambulatory Visit: Payer: Self-pay

## 2023-12-20 ENCOUNTER — Encounter (RURAL_HEALTH_CENTER): Payer: Self-pay | Admitting: Family Medicine

## 2023-12-20 ENCOUNTER — Ambulatory Visit (RURAL_HEALTH_CENTER): Payer: MEDICAID | Attending: Family Medicine | Admitting: Family Medicine

## 2023-12-20 VITALS — BP 118/70 | HR 76 | Temp 96.8°F | Ht 68.0 in | Wt 253.0 lb

## 2023-12-20 DIAGNOSIS — M5126 Other intervertebral disc displacement, lumbar region: Secondary | ICD-10-CM | POA: Insufficient documentation

## 2023-12-20 DIAGNOSIS — Z6836 Body mass index (BMI) 36.0-36.9, adult: Secondary | ICD-10-CM | POA: Insufficient documentation

## 2023-12-20 DIAGNOSIS — E669 Obesity, unspecified: Secondary | ICD-10-CM | POA: Insufficient documentation

## 2023-12-20 DIAGNOSIS — G540 Brachial plexus disorders: Secondary | ICD-10-CM | POA: Insufficient documentation

## 2023-12-20 MED ORDER — TRAMADOL 50 MG TABLET
1.0000 | ORAL_TABLET | Freq: Four times a day (QID) | ORAL | 0 refills | Status: DC | PRN
Start: 2023-12-20 — End: 2023-12-28

## 2023-12-20 NOTE — Progress Notes (Signed)
FAMILY MEDICINE, [facility] FAMILY MEDICINE [facility]  [address]  [place] [address]  Operated by [facility]     Name: Lindsay Crawford MRN:  [id number]   Date of Birth: 1976/11/22 Age: 48 y.o.   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 can decrease Lyrica to as needed.  Trial of tramadol to use up to 4 times a day for the next week.  I will follow up with her in 1 week to see if her symptoms have improved    Refer to pt       No follow-ups on file.      Orders Placed This Encounter    CBC/DIFF    COMPREHENSIVE METABOLIC PANEL, NON-FASTING    HGA1C (HEMOGLOBIN A1C WITH EST AVG GLUCOSE)    LIPID PANEL    THYROID STIMULATING HORMONE (SENSITIVE TSH)    MAGNESIUM    VITAMIN B12    VITAMIN D 25 TOTAL    IRON TRANSFERRIN AND TIBC    Refer to OB/GYN CS [place]    linaCLOtide (LINZESS) 72 mcg Oral Capsule    bethanechol chloride (URECHOLINE) 5 mg Oral Tablet    cetirizine (ZYRTEC) 10 mg Oral Tablet    cholecalciferol, vitamin D3, 1,250 mcg (50,000 unit) Oral Capsule    cyclobenzaprine (FLEXERIL) 10 mg Oral Tablet    famotidine (PEPCID) 40 mg Oral Tablet    metoclopramide HCl (REGLAN) 5 mg Oral Tablet    omeprazole (PRILOSEC) 40 mg Oral Capsule, Delayed Release(E.C.)    rosuvastatin (CRESTOR) 5 mg Oral Tablet    topiramate (TOPAMAX) 100 mg Oral Tablet    TraZODone (DESYREL) 300 mg Oral Tablet              Discontinued Medications     LINACLOTIDE (LINZESS) 145 MCG ORAL CAPSULE    Take 1 Capsule (145 mcg total) by mouth Every morning for 90 days     MELOXICAM (MOBIC) 7.5 MG ORAL TABLET    Take 1 Tablet (7.5 mg total) by mouth Once a day for 90 days      Reason for  visit: shoulder pain          History of Present Illness:  Lindsay Crawford is a 48 y.o. female with gastroparesis, GERD, obesity, borderline diabetes, chronic low back pain and DUB presents for sudden left shoulder pain.     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  She states it took her breath away.  She denies any injury but notes that her shoulder has continued to throb and radiates down her back.  She has chronic low back pain and is following with a Careers adviser.  We have her on Butrans 15 mcg as well as Lyrica.  She states that she continues to have sharp and stabbing pain that radiate down her low back and does not think medication is working and would like to try  something else.  Did have an appointment to see her back specialist in the early part of this month but had to reschedule due to the weather.  She does have a follow-up there on the 28th  Historical Data       Past Medical History:       Past Medical History:   Diagnosis Date    Body mass index 35.0-35.9, adult      Borderline diabetes      Chronic low back pain      Dizziness      DUB (dysfunctional uterine bleeding)      Gastroparesis      GERD (gastroesophageal reflux disease)      Hyperglycemia      Hyperlipidemia LDL goal <70      Insomnia      Knee pain, bilateral      Migraine      Paraspinal muscle spasm      Vitamin D deficiency              Past Surgical History:        Past Surgical History:   Procedure Laterality Date    CESAREAN SECTION   2004    COLONOSCOPY         Dr. Lequita Halt in [place]    ESOPHAGOSCOPY / EGD        HX APPENDECTOMY        HX BACK SURGERY        HX LAP CHOLECYSTECTOMY                Allergies:        Allergies   Allergen Reactions    Other Rash       Allergic to the sun       Medications:  medroxyPROGESTERone (PROVERA) 10 mg Oral Tablet, Take 1 Tablet (10 mg total) by mouth Twice daily  polyethylene glycol (MIRALAX) 17 gram/dose Oral Powder, Take 1 g  by mouth Once a day  bethanechol chloride (URECHOLINE) 5 mg Oral Tablet, Take 1 Tablet (5 mg total) by mouth Twice daily for 90 days  cetirizine (ZYRTEC) 10 mg Oral Tablet, Take 1 Tablet (10 mg total) by mouth Once a day for 90 days  cholecalciferol, vitamin D3, 1,250 mcg (50,000 unit) Oral Capsule, Take 1 Capsule (50,000 Units total) by mouth Every 7 days for 90 days  cyclobenzaprine (FLEXERIL) 10 mg Oral Tablet, Take 1 Tablet (10 mg total) by mouth Twice daily for 90 days  famotidine (PEPCID) 40 mg Oral Tablet, Take 1 Tablet (40 mg total) by mouth Once a day for 90 days  linaCLOtide (LINZESS) 145 mcg Oral Capsule, Take 1 Capsule (145 mcg total) by mouth Every morning for 90 days  meloxicam (MOBIC) 7.5 mg Oral Tablet, Take 1 Tablet (7.5 mg total) by mouth Once a day for 90 days  metoclopramide HCl (REGLAN) 5 mg Oral Tablet, Take 1 Tablet (5 mg total) by mouth Twice daily for 90 days  omeprazole (PRILOSEC) 40 mg Oral Capsule, Delayed Release(E.C.), Take 1 Capsule (40 mg total) by mouth Once a day for 90 days  rosuvastatin (CRESTOR) 5 mg Oral Tablet, Take 1 Tablet (5 mg total) by mouth Once a day for 90 days  topiramate (TOPAMAX) 100 mg Oral Tablet, Take 1 Tablet (100 mg total) by mouth Twice daily for 90 days  TraZODone (DESYREL) 300 mg Oral Tablet, Take 1 Tablet (300 mg total) by mouth Once a day for 30 days     No  facility-administered medications prior to visit.     Family History:       Family Medical History:      Problem Relation (Age of Onset)     COPD Father     Coronary Artery Disease Brother     Diabetes type II Father     Hypertension (High Blood Pressure) Mother, Father     Kidney Disease Father     Throat cancer Sister             Social History:  Social History            Socioeconomic History    Marital status: Widowed    Number of children: 1    Years of education: 12+    Highest education level: Associate degree: occupational, Scientist, product/process development, or vocational program   Tobacco Use    Smoking status: Never     Smokeless tobacco: Never   Vaping Use    Vaping Use: Never used   Substance and Sexual Activity    Alcohol use: Never    Drug use: Never    Sexual activity: Yes       Partners: Male       Birth control/protection: None               Review of Systems:  Any pertinent Review of Systems as addressed in the HPI above.     Physical Exam:  Vital Signs:      Vitals:     05/25/22 0954   BP: 110/70   Pulse: 55   SpO2: 96%   Weight: 110 kg (242 lb)   Height: 1.727 m (5\' 8" )   BMI: 36.87      Physical Exam  Vitals reviewed.   Constitutional:       Appearance: She is obese.   HENT:      Head: Normocephalic and atraumatic.      Right Ear: Tympanic membrane normal. There is no impacted cerumen.      Left Ear: Tympanic membrane normal. There is no impacted cerumen.      Nose: Nose normal.   Neck:      Vascular: No carotid bruit.   Cardiovascular:      Rate and Rhythm: Normal rate and regular rhythm.      Pulses: Normal pulses.      Heart sounds: Normal heart sounds.   Pulmonary:      Effort: Pulmonary effort is normal.      Breath sounds: Normal breath sounds. No wheezing, rhonchi or rales.   Musculoskeletal:      Right lower leg: No edema.      Left lower leg: No edema.   Ttp over Si joints bilaterally with paraspinal spasm  Has ttp under scapula left with radiation of pain with palpation.  Lymphadenopathy:      Cervical: No cervical adenopathy.   Skin:     General: Skin is warm and dry.      Capillary Refill: Capillary refill takes less than 2 seconds.   Incision sites are healing with some erythema. No calor. No rebound or guarding  Neurological:      General: No focal deficit present.      Mental Status: She is alert and oriented to person, place, and time.             Weyman Pedro, DO      Portions of this note may be dictated using voice recognition software or a dictation service. Variances in spelling  and vocabulary are possible and unintentional. Not all errors are caught/corrected. Please notify the Thereasa Parkin if any  discrepancies are noted or if the meaning of any statement is not clear.

## 2023-12-28 ENCOUNTER — Other Ambulatory Visit: Payer: Self-pay

## 2023-12-28 ENCOUNTER — Ambulatory Visit (RURAL_HEALTH_CENTER): Payer: MEDICAID | Attending: Family Medicine | Admitting: Family Medicine

## 2023-12-28 ENCOUNTER — Encounter (RURAL_HEALTH_CENTER): Payer: Self-pay | Admitting: Family Medicine

## 2023-12-28 ENCOUNTER — Ambulatory Visit: Payer: MEDICAID | Attending: Family Medicine | Admitting: Family Medicine

## 2023-12-28 VITALS — BP 136/86 | HR 75 | Temp 99.0°F | Ht 68.0 in | Wt 244.0 lb

## 2023-12-28 DIAGNOSIS — E669 Obesity, unspecified: Secondary | ICD-10-CM | POA: Insufficient documentation

## 2023-12-28 DIAGNOSIS — Z6837 Body mass index (BMI) 37.0-37.9, adult: Secondary | ICD-10-CM | POA: Insufficient documentation

## 2023-12-28 DIAGNOSIS — E611 Iron deficiency: Secondary | ICD-10-CM | POA: Insufficient documentation

## 2023-12-28 DIAGNOSIS — E559 Vitamin D deficiency, unspecified: Secondary | ICD-10-CM | POA: Insufficient documentation

## 2023-12-28 DIAGNOSIS — M5126 Other intervertebral disc displacement, lumbar region: Secondary | ICD-10-CM | POA: Insufficient documentation

## 2023-12-28 DIAGNOSIS — Z6836 Body mass index (BMI) 36.0-36.9, adult: Secondary | ICD-10-CM | POA: Insufficient documentation

## 2023-12-28 DIAGNOSIS — R5383 Other fatigue: Secondary | ICD-10-CM | POA: Insufficient documentation

## 2023-12-28 MED ORDER — HYDROCODONE 7.5 MG-ACETAMINOPHEN 325 MG TABLET
1.0000 | ORAL_TABLET | Freq: Three times a day (TID) | ORAL | 0 refills | Status: DC | PRN
Start: 2023-12-28 — End: 2024-01-03

## 2023-12-28 MED ORDER — BETHANECHOL CHLORIDE 10 MG TABLET
10.0000 mg | ORAL_TABLET | Freq: Two times a day (BID) | ORAL | 1 refills | Status: DC
Start: 2023-12-28 — End: 2024-06-25

## 2023-12-28 NOTE — Progress Notes (Signed)
FAMILY MEDICINE, [facility] FAMILY MEDICINE [facility]  [address]  [place] [address]  Operated by [facility]     Name: DEVLYNN KNOFF MRN:  [id number]   Date of Birth: July 26, 1976 Age: 48 y.o.   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 Prescribed Norco. Will recheck Tuesday. If medicine helps will prescribe for three months. PDMP reviewed. Controlled substance contract on file.    Sample of mounjaro 2.5mg  weekly for 8 weeks. Hold 8 days prior to surgery  No family hx of MEN2 or thyroid cancer. No pancreatitis. Will worsen gastroparesis so if develop nausea, we will have to stop as she already has this issue and follows with GI. Continue bethanechol, and prescribed sent.    Orders Placed This Encounter    IRON TRANSFERRIN AND TIBC    CBC/DIFF    COMPREHENSIVE METABOLIC PANEL, NON-FASTING    THYROID STIMULATING HORMONE (SENSITIVE TSH)    MAGNESIUM    VITAMIN B12    VITAMIN D 25 TOTAL    CBC WITH DIFF    HYDROcodone-acetaminophen (NORCO) 7.5-325 mg Oral Tablet    bethanechol chloride (URECHOLINE) 10 mg Oral Tablet              History of Present Illness:  Lindsay Crawford is a 48 y.o. female with gastroparesis, GERD, obesity, borderline diabetes, chronic low back pain and DUB (btl and ablation) presents in f/u of chronic pain    Surgery in April for her  back. Needs to lose about 20lbs before he can proceed with surgery. Adipex does not seem to help as much as she had hoped. She thinks the dose may need to be increased.    Tramadol is not helping for the pain.  Tried qid without relief    Has fatigue. Started taking iron left over since so tired.     Historical Data       Past Medical History:       Past Medical History:   Diagnosis Date    Body mass index 35.0-35.9, adult      Borderline diabetes      Chronic low back pain      Dizziness      DUB (dysfunctional uterine bleeding)      Gastroparesis      GERD (gastroesophageal reflux disease)      Hyperglycemia      Hyperlipidemia LDL goal <70      Insomnia      Knee pain, bilateral  Migraine      Paraspinal muscle spasm      Vitamin D deficiency              Past Surgical History:        Past Surgical History:   Procedure Laterality Date    CESAREAN SECTION   2004    COLONOSCOPY         Dr. Lequita Halt in [place]    ESOPHAGOSCOPY / EGD        HX APPENDECTOMY        HX BACK SURGERY        HX LAP CHOLECYSTECTOMY                Allergies:        Allergies   Allergen Reactions    Other Rash       Allergic to the sun       Medications:  medroxyPROGESTERone (PROVERA) 10 mg Oral Tablet, Take 1 Tablet (10 mg total) by mouth Twice daily  polyethylene glycol (MIRALAX) 17 gram/dose Oral Powder, Take 1 g by mouth Once a day  bethanechol chloride (URECHOLINE) 5 mg Oral Tablet, Take 1 Tablet (5 mg total) by mouth Twice daily for 90 days  cetirizine (ZYRTEC) 10 mg Oral Tablet, Take 1 Tablet (10 mg total) by mouth Once a day for 90 days  cholecalciferol, vitamin D3, 1,250 mcg (50,000 unit) Oral Capsule, Take 1 Capsule (50,000 Units total) by mouth Every 7 days for 90 days  cyclobenzaprine (FLEXERIL) 10 mg Oral Tablet, Take 1 Tablet (10 mg total) by mouth Twice daily for 90 days  famotidine (PEPCID) 40 mg Oral Tablet, Take 1 Tablet (40 mg total) by mouth Once a day for 90 days  linaCLOtide (LINZESS) 145 mcg Oral Capsule, Take 1  Capsule (145 mcg total) by mouth Every morning for 90 days  meloxicam (MOBIC) 7.5 mg Oral Tablet, Take 1 Tablet (7.5 mg total) by mouth Once a day for 90 days  metoclopramide HCl (REGLAN) 5 mg Oral Tablet, Take 1 Tablet (5 mg total) by mouth Twice daily for 90 days  omeprazole (PRILOSEC) 40 mg Oral Capsule, Delayed Release(E.C.), Take 1 Capsule (40 mg total) by mouth Once a day for 90 days  rosuvastatin (CRESTOR) 5 mg Oral Tablet, Take 1 Tablet (5 mg total) by mouth Once a day for 90 days  topiramate (TOPAMAX) 100 mg Oral Tablet, Take 1 Tablet (100 mg total) by mouth Twice daily for 90 days  TraZODone (DESYREL) 300 mg Oral Tablet, Take 1 Tablet (300 mg total) by mouth Once a day for 30 days     No facility-administered medications prior to visit.     Family History:       Family Medical History:      Problem Relation (Age of Onset)     COPD Father     Coronary Artery Disease Brother     Diabetes type II Father     Hypertension (High Blood Pressure) Mother, Father     Kidney Disease Father     Throat cancer Sister             Social History:  Social History            Socioeconomic History    Marital status: Widowed    Number of children: 1    Years of education: 12+    Highest education level: Associate degree: occupational, Scientist, product/process development, or vocational program   Tobacco Use    Smoking status: Never  Smokeless tobacco: Never   Vaping Use    Vaping Use: Never used   Substance and Sexual Activity    Alcohol use: Never    Drug use: Never    Sexual activity: Yes       Partners: Male       Birth control/protection: None               Review of Systems:  Any pertinent Review of Systems as addressed in the HPI above.     Physical Exam:  Vital Signs:      Vitals:     05/25/22 0954   BP: 110/70   Pulse: 55   SpO2: 96%   Weight: 110 kg (242 lb)   Height: 1.727 m (5\' 8" )   BMI: 36.87      Physical Exam  Vitals reviewed.   Constitutional:       Appearance: She is obese.   HENT:      Head: Normocephalic and atraumatic.      Right  Ear: Tympanic membrane normal. There is no impacted cerumen.      Left Ear: Tympanic membrane normal. There is no impacted cerumen.      Nose: Nose normal.   Neck:      Vascular: No carotid bruit.   Cardiovascular:      Rate and Rhythm: Normal rate and regular rhythm.      Pulses: Normal pulses.      Heart sounds: Normal heart sounds.   Pulmonary:      Effort: Pulmonary effort is normal.      Breath sounds: Normal breath sounds. No wheezing, rhonchi or rales.   Musculoskeletal:      Right lower leg: No edema.      Left lower leg: No edema.   Ttp over Si joints bilaterally with paraspinal spasm  Has ttp under scapula left with radiation of pain with palpation.  Lymphadenopathy:      Cervical: No cervical adenopathy.   Skin:     General: Skin is warm and dry.      Capillary Refill: Capillary refill takes less than 2 seconds.   Incision sites are healing with some erythema. No calor. No rebound or guarding  Neurological:      General: No focal deficit present.      Mental Status: She is alert and oriented to person, place, and time.             Weyman Pedro, DO      Portions of this note may be dictated using voice recognition software or a dictation service. Variances in spelling and vocabulary are possible and unintentional. Not all errors are caught/corrected. Please notify the Thereasa Parkin if any discrepancies are noted or if the meaning of any statement is not clear.

## 2023-12-29 LAB — CBC WITH DIFF
BASOPHIL #: 0.1 10*3/uL (ref 0.00–0.10)
BASOPHIL %: 1 % (ref 0–1)
EOSINOPHIL #: 0.2 10*3/uL (ref 0.00–0.50)
EOSINOPHIL %: 2 % (ref 1–7)
HCT: 39.6 % (ref 31.2–41.9)
HGB: 13.6 g/dL (ref 10.9–14.3)
LYMPHOCYTE #: 2.1 10*3/uL (ref 1.00–3.00)
LYMPHOCYTE %: 26 % (ref 16–44)
MCH: 28.7 pg (ref 24.7–32.8)
MCHC: 34.2 g/dL (ref 32.3–35.6)
MCV: 84 fL (ref 75.5–95.3)
MONOCYTE #: 0.5 10*3/uL (ref 0.30–1.00)
MONOCYTE %: 6 % (ref 5–13)
MPV: 8.4 fL (ref 7.9–10.8)
NEUTROPHIL #: 5.3 10*3/uL (ref 1.85–7.80)
NEUTROPHIL %: 65 % (ref 43–77)
PLATELETS: 264 10*3/uL (ref 140–440)
RBC: 4.72 10*6/uL (ref 3.63–4.92)
RDW: 12.9 % (ref 12.3–17.7)
WBC: 8.1 10*3/uL (ref 3.8–11.8)

## 2023-12-29 LAB — COMPREHENSIVE METABOLIC PANEL, NON-FASTING
ALBUMIN/GLOBULIN RATIO: 1.7 — ABNORMAL HIGH (ref 0.8–1.4)
ALBUMIN: 4.7 g/dL (ref 3.5–5.7)
ALKALINE PHOSPHATASE: 65 U/L (ref 34–104)
ALT (SGPT): 32 U/L (ref 7–52)
ANION GAP: 9 mmol/L (ref 4–13)
AST (SGOT): 24 U/L (ref 13–39)
BILIRUBIN TOTAL: 0.5 mg/dL (ref 0.3–1.0)
BUN/CREA RATIO: 17 (ref 6–22)
BUN: 14 mg/dL (ref 7–25)
CALCIUM, CORRECTED: 8.9 mg/dL (ref 8.9–10.8)
CALCIUM: 9.5 mg/dL (ref 8.6–10.3)
CHLORIDE: 103 mmol/L (ref 98–107)
CO2 TOTAL: 26 mmol/L (ref 21–31)
CREATININE: 0.81 mg/dL (ref 0.60–1.30)
ESTIMATED GFR: 91 mL/min/{1.73_m2} (ref >59)
GLOBULIN: 2.7 (ref 2.0–3.5)
GLUCOSE: 119 mg/dL — ABNORMAL HIGH (ref 74–109)
OSMOLALITY, CALCULATED: 277 mosm/kg (ref 270–290)
POTASSIUM: 4.1 mmol/L (ref 3.5–5.1)
PROTEIN TOTAL: 7.4 g/dL (ref 6.4–8.9)
SODIUM: 138 mmol/L (ref 136–145)

## 2023-12-29 LAB — VITAMIN B12: VITAMIN B 12: 885 pg/mL (ref 180–914)

## 2023-12-29 LAB — VITAMIN D 25 TOTAL: VITAMIN D 25, TOTAL: 65.82 ng/mL (ref 30.00–100.00)

## 2023-12-29 LAB — IRON TRANSFERRIN AND TIBC
IRON (TRANSFERRIN) SATURATION: 22 % (ref 15–50)
IRON: 85 ug/dL (ref 50–212)
TOTAL IRON BINDING CAPACITY: 392 ug/dL (ref 250–450)
TRANSFERRIN: 280 mg/dL (ref 203–362)
UIBC: 307 ug/dL (ref 130–375)

## 2023-12-29 LAB — MAGNESIUM: MAGNESIUM: 1.7 mg/dL — ABNORMAL LOW (ref 1.9–2.7)

## 2023-12-29 LAB — THYROID STIMULATING HORMONE (SENSITIVE TSH): TSH: 2.172 u[IU]/mL (ref 0.450–5.330)

## 2024-01-03 ENCOUNTER — Other Ambulatory Visit (RURAL_HEALTH_CENTER): Payer: Self-pay | Admitting: Family

## 2024-01-03 ENCOUNTER — Encounter (RURAL_HEALTH_CENTER): Payer: Self-pay | Admitting: Family Medicine

## 2024-01-03 ENCOUNTER — Other Ambulatory Visit: Payer: Self-pay

## 2024-01-03 ENCOUNTER — Ambulatory Visit (RURAL_HEALTH_CENTER): Payer: MEDICAID | Attending: Family Medicine | Admitting: Family Medicine

## 2024-01-03 DIAGNOSIS — M5126 Other intervertebral disc displacement, lumbar region: Secondary | ICD-10-CM | POA: Insufficient documentation

## 2024-01-03 DIAGNOSIS — E669 Obesity, unspecified: Secondary | ICD-10-CM | POA: Insufficient documentation

## 2024-01-03 DIAGNOSIS — M5416 Radiculopathy, lumbar region: Secondary | ICD-10-CM | POA: Insufficient documentation

## 2024-01-03 MED ORDER — HYDROCODONE 7.5 MG-ACETAMINOPHEN 325 MG TABLET
1.0000 | ORAL_TABLET | Freq: Three times a day (TID) | ORAL | 0 refills | Status: DC | PRN
Start: 2024-01-03 — End: 2024-02-22

## 2024-01-03 NOTE — Progress Notes (Signed)
FAMILY MEDICINE, [facility] FAMILY MEDICINE [facility]  [address]  [place] [address]  Operated by [facility]  Telephone Visit    Name:  Lindsay Crawford MRN: [id number]   Date:  01/03/2024 Age:   48 y.o.     The patient/family initiated a request for telephone service.  Verbal consent for this service was obtained from the patient/family.    Last office visit in this department: 12/28/2023      Reason for call: follow up on chronic pain  Call notes:  Lindsay Crawford is a 48 y.o. female with gastroparesis, GERD, obesity, borderline diabetes, chronic low back pain and DUB (btl and ablation) who presents in f/u of chronic pain    Left side flank pain and radiation into stomach with spasms. Was in er 12/30/23 with kidney stone. Pain 8-9/10. After the hydrocodone, pain decreases to 6/10  Notes pain medicine is helping but she is taking with muscle relaxer with it. This helps better than the butrans/tramadol/Lyrica/NSAID'S did.      Appt in April with surgeon. He wants her to lose 15 lbs. She is taking injectable Sample of mounjaro 2.5mg  weekly for 8 weeks.       ICD-10-CM    1. 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 Lumbar back pain with radiculopathy affecting left lower extremity  M54.16         PDMP reviewed. Controlled substance contract on file.  Patient is currently getting adequate pain relief with the use of the hydrocodone 7.5.  We have exhausted other means of pain management at this time.  I did give her 1 month of the medication and we will reassess.  Currently, she is having to take a muscle relaxer close to the same time as her hydrocodone.  She is not having adverse effects with use of this medication regimen and we will continue for the next month    Continue mounjaro for weight loss. I am hopeful this puts her at her optimal health before her planned surgical procedure in April.    Total provider time spent with the patient on the  phone: 5 minutes.    Weyman Pedro, DO

## 2024-01-24 ENCOUNTER — Ambulatory Visit (RURAL_HEALTH_CENTER): Payer: Self-pay | Admitting: Family Medicine

## 2024-01-26 ENCOUNTER — Ambulatory Visit (RURAL_HEALTH_CENTER): Payer: Self-pay | Admitting: Family Medicine

## 2024-01-30 ENCOUNTER — Other Ambulatory Visit: Payer: Self-pay

## 2024-01-30 ENCOUNTER — Encounter (RURAL_HEALTH_CENTER): Payer: Self-pay | Admitting: Family Medicine

## 2024-01-30 ENCOUNTER — Ambulatory Visit (RURAL_HEALTH_CENTER): Payer: MEDICAID | Attending: Family Medicine | Admitting: Family Medicine

## 2024-01-30 VITALS — BP 124/86 | HR 82 | Temp 98.5°F | Ht 68.0 in | Wt 239.0 lb

## 2024-01-30 DIAGNOSIS — G540 Brachial plexus disorders: Secondary | ICD-10-CM | POA: Insufficient documentation

## 2024-01-30 DIAGNOSIS — M542 Cervicalgia: Secondary | ICD-10-CM | POA: Insufficient documentation

## 2024-01-30 DIAGNOSIS — M5126 Other intervertebral disc displacement, lumbar region: Secondary | ICD-10-CM | POA: Insufficient documentation

## 2024-01-30 DIAGNOSIS — M545 Low back pain, unspecified: Secondary | ICD-10-CM

## 2024-01-30 DIAGNOSIS — G43909 Migraine, unspecified, not intractable, without status migrainosus: Secondary | ICD-10-CM | POA: Insufficient documentation

## 2024-01-30 DIAGNOSIS — G8929 Other chronic pain: Secondary | ICD-10-CM | POA: Insufficient documentation

## 2024-01-30 DIAGNOSIS — Z6836 Body mass index (BMI) 36.0-36.9, adult: Secondary | ICD-10-CM | POA: Insufficient documentation

## 2024-01-30 MED ORDER — VENLAFAXINE ER 150 MG CAPSULE,EXTENDED RELEASE 24 HR
150.0000 mg | ORAL_CAPSULE | Freq: Every day | ORAL | 3 refills | Status: DC
Start: 2024-01-30 — End: 2024-05-28

## 2024-01-30 MED ORDER — HYDROCODONE 7.5 MG-ACETAMINOPHEN 325 MG TABLET
1.0000 | ORAL_TABLET | Freq: Three times a day (TID) | ORAL | 0 refills | Status: AC | PRN
Start: 2024-01-30 — End: 2024-02-28

## 2024-01-30 MED ORDER — HYDROCODONE 7.5 MG-ACETAMINOPHEN 325 MG TABLET
1.0000 | ORAL_TABLET | Freq: Three times a day (TID) | ORAL | 0 refills | Status: DC | PRN
Start: 2024-02-29 — End: 2024-03-27

## 2024-01-30 NOTE — Progress Notes (Addendum)
 FAMILY MEDICINE, [facility] FAMILY MEDICINE [facility]  [address]  [place] [address]  Operated by [facility]     Name: Lindsay Crawford MRN:  [id number]   Date of Birth: 1976-11-06 Age: 48 y.o.   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 Norco helped. Rx written for next two months. Keep appt with vascular surgeon. Neurosurgeon will take care of post op pain and she will hold Norco.  Recheck end of April    PDMP reviewed. Controlled substance contract on file.    Nurtec for migraines. To keep record of headaches. Sample Lot [id number]  Exp 05/26 #4 boxes given    Increase Effexor 150mg  ER    Xray of neck. Thoracic outlet significantly better per pt    Sample of mounjaro 2.5mg  weekly for 4 weeks. Hold 8 days prior to surgery  No family hx of MEN2 or thyroid cancer. No pancreatitis. Will worsen gastroparesis so if develop nausea, we will have to stop as she already has this issue and follows with GI.     Orders Placed This Encounter    XR CERVICAL SPINE COMPLETE (4 OR MORE VIEWS)    HYDROcodone-acetaminophen (NORCO) 7.5-325 mg Oral Tablet    HYDROcodone-acetaminophen (NORCO) 7.5-325 mg Oral Tablet    venlafaxine (EFFEXOR XR) 150 mg Oral Capsule, Sust. Release 24 hr              History of Present Illness:  Lindsay Crawford is a 48 y.o. female with gastroparesis, GERD, obesity, borderline diabetes, chronic low back pain and DUB (btl and ablation) presents in f/u of chronic pain      Colonoscopy 5 yr patel  Pap  [place]dnick  [place] ey feb 25    Vascular surgeon consult on 3/20  Does not have to see Back surgeon Dr. Isabel Caprice before surgery  Surgery in April for her back. Needs to lose another 9 lbs before surgery. Taking tirzepatide with some weight loss. Advised her to stop taking medicine 8 days prior to surgery.       Historical Data       Past Medical History:       Past Medical History:   Diagnosis Date    Body mass index 35.0-35.9, adult      Borderline diabetes      Chronic low back pain      Dizziness      DUB (dysfunctional uterine bleeding)      Gastroparesis      GERD (gastroesophageal reflux disease)      Hyperglycemia      Hyperlipidemia LDL goal <70      Insomnia      Knee pain, bilateral  Migraine      Paraspinal muscle spasm      Vitamin D deficiency              Past Surgical History:        Past Surgical History:   Procedure Laterality Date    CESAREAN SECTION   2004    COLONOSCOPY         Dr. Lequita Halt in [place]    ESOPHAGOSCOPY / EGD        HX APPENDECTOMY        HX BACK SURGERY        HX LAP CHOLECYSTECTOMY                Allergies:        Allergies   Allergen Reactions    Other Rash       Allergic to the sun       Medications:  medroxyPROGESTERone (PROVERA) 10 mg Oral Tablet, Take 1 Tablet (10 mg total) by mouth Twice daily  polyethylene glycol (MIRALAX) 17 gram/dose Oral Powder, Take 1 g by mouth Once a day  bethanechol chloride (URECHOLINE) 5 mg Oral Tablet, Take 1 Tablet (5 mg total) by mouth Twice daily for 90 days  cetirizine (ZYRTEC) 10 mg Oral Tablet, Take 1 Tablet (10 mg total) by mouth Once a day for 90 days  cholecalciferol, vitamin D3, 1,250 mcg (50,000 unit) Oral Capsule, Take 1 Capsule (50,000 Units total) by mouth Every 7 days for 90 days  cyclobenzaprine (FLEXERIL) 10 mg Oral Tablet, Take 1 Tablet (10 mg total) by mouth Twice daily for 90 days  famotidine (PEPCID) 40 mg Oral Tablet, Take 1 Tablet (40 mg total) by mouth Once a day for 90 days  linaCLOtide (LINZESS) 145 mcg Oral Capsule, Take  1 Capsule (145 mcg total) by mouth Every morning for 90 days  meloxicam (MOBIC) 7.5 mg Oral Tablet, Take 1 Tablet (7.5 mg total) by mouth Once a day for 90 days  metoclopramide HCl (REGLAN) 5 mg Oral Tablet, Take 1 Tablet (5 mg total) by mouth Twice daily for 90 days  omeprazole (PRILOSEC) 40 mg Oral Capsule, Delayed Release(E.C.), Take 1 Capsule (40 mg total) by mouth Once a day for 90 days  rosuvastatin (CRESTOR) 5 mg Oral Tablet, Take 1 Tablet (5 mg total) by mouth Once a day for 90 days  topiramate (TOPAMAX) 100 mg Oral Tablet, Take 1 Tablet (100 mg total) by mouth Twice daily for 90 days  TraZODone (DESYREL) 300 mg Oral Tablet, Take 1 Tablet (300 mg total) by mouth Once a day for 30 days     No facility-administered medications prior to visit.     Family History:       Family Medical History:      Problem Relation (Age of Onset)     COPD Father     Coronary Artery Disease Brother     Diabetes type II Father     Hypertension (High Blood Pressure) Mother, Father     Kidney Disease Father     Throat cancer Sister             Social History:  Social History            Socioeconomic History    Marital status: Widowed    Number of children: 1    Years of education: 12+    Highest education level: Associate degree: occupational, Scientist, product/process development, or vocational program   Tobacco Use    Smoking status: Never  Smokeless tobacco: Never   Vaping Use    Vaping Use: Never used   Substance and Sexual Activity    Alcohol use: Never    Drug use: Never    Sexual activity: Yes       Partners: Male       Birth control/protection: None               Review of Systems:  Any pertinent Review of Systems as addressed in the HPI above.     Physical Exam:  Vital Signs:      Vitals:     05/25/22 0954   BP: 110/70   Pulse: 55   SpO2: 96%   Weight: 110 kg (242 lb)   Height: 1.727 m (5\' 8" )   BMI: 36.87      Physical Exam  Vitals reviewed.   Constitutional:       Appearance: She is obese.   HENT:      Head: Normocephalic and atraumatic.       Right Ear: Tympanic membrane normal. There is no impacted cerumen.      Left Ear: Tympanic membrane normal. There is no impacted cerumen.      Nose: Nose normal.   Neck:      Vascular: No carotid bruit.   Cardiovascular:      Rate and Rhythm: Normal rate and regular rhythm.      Pulses: Normal pulses.      Heart sounds: Normal heart sounds.   Pulmonary:      Effort: Pulmonary effort is normal.      Breath sounds: Normal breath sounds. No wheezing, rhonchi or rales.   Musculoskeletal:      Right lower leg: No edema.      Left lower leg: No edema.   Ttp over Si joints bilaterally with paraspinal spasm  Has crepitance of neck  Lymphadenopathy:      Cervical: No cervical adenopathy.   Skin:     General: Skin is warm and dry.      Capillary Refill: Capillary refill takes less than 2 seconds.   Incision sites are healing with some erythema. No calor. No rebound or guarding  Neurological:      General: No focal deficit present.      Mental Status: She is alert and oriented to person, place, and time.             Weyman Pedro, DO      Portions of this note may be dictated using voice recognition software or a dictation service. Variances in spelling and vocabulary are possible and unintentional. Not all errors are caught/corrected. Please notify the [facility] Parkin if any discrepancies are noted or if the meaning of any statement is not clear.

## 2024-02-03 NOTE — Progress Notes (Signed)
 Order(s) created erroneously. Erroneous order ID: [id number]   Order moved by: Real Cons   Order move date/time: 02/03/2024 11:26 AM   Source Patient: [id number]   Source Contact: 01/25/2024   Destination Patient: [id number]   Destination Contact: 01/30/2024

## 2024-02-12 ENCOUNTER — Emergency Department
Admission: EM | Admit: 2024-02-12 | Discharge: 2024-02-12 | Disposition: A | Discharge status: Home / Self Care | Payer: MEDICAID | Attending: Nurse Practitioner | Admitting: Nurse Practitioner

## 2024-02-12 ENCOUNTER — Other Ambulatory Visit: Payer: Self-pay

## 2024-02-12 ENCOUNTER — Encounter (HOSPITAL_BASED_OUTPATIENT_CLINIC_OR_DEPARTMENT_OTHER): Payer: Self-pay

## 2024-02-12 ENCOUNTER — Emergency Department (HOSPITAL_BASED_OUTPATIENT_CLINIC_OR_DEPARTMENT_OTHER): Payer: MEDICAID

## 2024-02-12 DIAGNOSIS — R109 Unspecified abdominal pain: Secondary | ICD-10-CM | POA: Insufficient documentation

## 2024-02-12 DIAGNOSIS — M546 Pain in thoracic spine: Secondary | ICD-10-CM

## 2024-02-12 DIAGNOSIS — R1032 Left lower quadrant pain: Secondary | ICD-10-CM

## 2024-02-12 DIAGNOSIS — Z87442 Personal history of urinary calculi: Secondary | ICD-10-CM | POA: Insufficient documentation

## 2024-02-12 DIAGNOSIS — R350 Frequency of micturition: Secondary | ICD-10-CM

## 2024-02-12 LAB — URINALYSIS, MACRO/MICRO
BILIRUBIN: NEGATIVE mg/dL
BLOOD: NEGATIVE mg/dL
GLUCOSE: NEGATIVE mg/dL
KETONES: NEGATIVE mg/dL
NITRITE: NEGATIVE
PH: 7 (ref 4.6–8.0)
PROTEIN: NEGATIVE mg/dL
SPECIFIC GRAVITY: 1.01 (ref 1.003–1.035)
UROBILINOGEN: 0.2 mg/dL (ref 0.2–1.0)

## 2024-02-12 LAB — CBC WITH DIFF
BASOPHIL #: 0.01 10*3/uL (ref 0.00–0.10)
BASOPHIL %: 0 % (ref 0–1)
EOSINOPHIL #: 0.22 10*3/uL (ref 0.00–0.50)
EOSINOPHIL %: 4 % (ref 1–7)
HCT: 41.5 % (ref 31.2–41.9)
HGB: 14.1 g/dL (ref 10.9–14.3)
LYMPHOCYTE #: 1.37 10*3/uL (ref 1.10–3.10)
LYMPHOCYTE %: 23 % (ref 16–46)
MCH: 29.6 pg (ref 24.7–32.8)
MCHC: 34 g/dL (ref 32.3–35.6)
MCV: 87.1 fL (ref 75.5–95.3)
MONOCYTE #: 0.35 10*3/uL (ref 0.20–0.90)
MONOCYTE %: 6 % (ref 4–11)
MPV: 8.2 fL (ref 7.9–10.8)
NEUTROPHIL #: 4.03 10*3/uL (ref 1.90–8.20)
NEUTROPHIL %: 67 % (ref 43–77)
PLATELETS: 200 10*3/uL (ref 140–440)
RBC: 4.77 10*6/uL (ref 3.63–4.92)
RDW: 16.1 % (ref 12.3–17.7)
WBC: 6 10*3/uL (ref 3.8–11.8)

## 2024-02-12 LAB — URINALYSIS, MICROSCOPIC

## 2024-02-12 LAB — COMPREHENSIVE METABOLIC PANEL, NON-FASTING
ALBUMIN/GLOBULIN RATIO: 1.1 (ref 0.8–1.4)
ALBUMIN: 3.8 g/dL (ref 3.4–5.0)
ALKALINE PHOSPHATASE: 112 U/L (ref 46–116)
ALT (SGPT): 72 U/L (ref <=78)
ANION GAP: 9 mmol/L (ref 4–13)
AST (SGOT): 38 U/L — ABNORMAL HIGH (ref 15–37)
BILIRUBIN TOTAL: 0.4 mg/dL (ref 0.2–1.0)
BUN/CREA RATIO: 8
BUN: 8 mg/dL (ref 7–18)
CALCIUM, CORRECTED: 9.8 mg/dL
CALCIUM: 9.6 mg/dL (ref 8.5–10.1)
CHLORIDE: 101 mmol/L (ref 98–107)
CO2 TOTAL: 27 mmol/L (ref 21–32)
CREATININE: 0.95 mg/dL (ref 0.55–1.02)
ESTIMATED GFR: 75 mL/min/{1.73_m2} (ref >59)
GLOBULIN: 3.6
GLUCOSE: 123 mg/dL — ABNORMAL HIGH (ref 74–106)
OSMOLALITY, CALCULATED: 274 mosm/kg (ref 270–290)
POTASSIUM: 4.5 mmol/L (ref 3.5–5.1)
PROTEIN TOTAL: 7.4 g/dL (ref 6.4–8.2)
SODIUM: 137 mmol/L (ref 136–145)

## 2024-02-12 MED ORDER — METHOCARBAMOL 500 MG TABLET
500.0000 mg | ORAL_TABLET | Freq: Four times a day (QID) | ORAL | 0 refills | Status: AC
Start: 2024-02-12 — End: 2024-02-22

## 2024-02-12 MED ORDER — ACETAMINOPHEN 325 MG TABLET
650.0000 mg | ORAL_TABLET | ORAL | Status: AC
Start: 2024-02-12 — End: 2024-02-12
  Administered 2024-02-12: 650 mg via ORAL

## 2024-02-12 MED ORDER — KETOROLAC 30 MG/ML (1 ML) INJECTION SOLUTION
30.0000 mg | INTRAMUSCULAR | Status: AC
Start: 2024-02-12 — End: 2024-02-12
  Administered 2024-02-12: 30 mg via INTRAVENOUS

## 2024-02-12 MED ORDER — METHOCARBAMOL 500 MG TABLET
ORAL_TABLET | ORAL | Status: AC
Start: 2024-02-12 — End: 2024-02-12
  Filled 2024-02-12: qty 2

## 2024-02-12 MED ORDER — KETOROLAC 30 MG/ML (1 ML) INJECTION SOLUTION
INTRAMUSCULAR | Status: AC
Start: 2024-02-12 — End: 2024-02-12
  Filled 2024-02-12: qty 1

## 2024-02-12 MED ORDER — METHOCARBAMOL 500 MG TABLET
1000.0000 mg | ORAL_TABLET | ORAL | Status: AC
Start: 2024-02-12 — End: 2024-02-12
  Administered 2024-02-12: 1000 mg via ORAL

## 2024-02-12 MED ORDER — IBUPROFEN 600 MG TABLET
600.0000 mg | ORAL_TABLET | Freq: Four times a day (QID) | ORAL | 0 refills | Status: DC | PRN
Start: 2024-02-12 — End: 2024-03-27

## 2024-02-12 MED ORDER — ACETAMINOPHEN 325 MG TABLET
ORAL_TABLET | ORAL | Status: AC
Start: 2024-02-12 — End: 2024-02-12
  Filled 2024-02-12: qty 2

## 2024-02-12 NOTE — ED Nurses Note (Signed)

## 2024-02-12 NOTE — ED Provider Notes (Signed)
 [facility], [facility] - Emergency Department  ED Primary Provider Note  History of Present Illness   Chief Complaint   Patient presents with    Flank Pain     Patient c/o left sided flank pain radiating into her back. Hx kidney stones - states she felt the same symptoms last time. Denies nausea/vomiting. States she has intermittent urinary frequency. Symptoms started approx 4 days ago.      Lindsay Crawford is a 48 y.o. female who had concerns including Flank Pain.  Arrival: The patient arrived by Car      This 48 year old female presents to the emergency department complaining of left-sided back pain that radiates into the left flank and into the left lower quadrant that started 4 days ago.  She describes the pain as sharp in nature.  She reports history of kidney stones in the past.  She denies dysuria.  She denies fever.  She denies nausea or vomiting.      History provided by:  Patient    History Reviewed This Encounter: Medical History  Surgical History  Family History  Social History    Physical Exam   ED Triage Vitals [02/12/24 1201]   BP (Non-Invasive) 128/86   Heart Rate 86   Respiratory Rate 18   Temperature 36.7 C (98.1 F)   SpO2 98 %   Weight 110 kg (243 lb)   Height 1.727 m (5\' 8" )     Physical Exam  Vitals reviewed.   Constitutional:       General: She is not in acute distress.     Appearance: Normal appearance. She is not ill-appearing, toxic-appearing or diaphoretic.   HENT:      Head: Normocephalic and atraumatic.      Right Ear: External ear normal.      Left Ear: External ear normal.      Nose: Nose normal.      Mouth/Throat:      Mouth: Mucous membranes are moist.      Pharynx: Oropharynx is clear.   Eyes:      Extraocular Movements: Extraocular movements intact.      Conjunctiva/sclera: Conjunctivae normal.      Pupils: Pupils are equal, round, and reactive to light.   Cardiovascular:      Rate and Rhythm: Normal rate and regular rhythm.      Pulses: Normal pulses.       Heart sounds: Normal heart sounds.   Pulmonary:      Effort: Pulmonary effort is normal. No respiratory distress.      Breath sounds: Normal breath sounds. No stridor. No wheezing, rhonchi or rales.   Abdominal:      General: Abdomen is flat. Bowel sounds are normal. There is no distension.      Palpations: Abdomen is soft. There is no mass.      Tenderness: There is no abdominal tenderness. There is left CVA tenderness. There is no right CVA tenderness, guarding or rebound.      Hernia: No hernia is present.   Musculoskeletal:         General: No swelling or tenderness. Normal range of motion.      Cervical back: Normal range of motion and neck supple.   Skin:     General: Skin is warm and dry.      Capillary Refill: Capillary refill takes less than 2 seconds.      Coloration: Skin is not jaundiced or pale.   Neurological:  General: No focal deficit present.      Mental Status: She is alert and oriented to person, place, and time.      Cranial Nerves: No cranial nerve deficit.      Sensory: No sensory deficit.   Psychiatric:         Mood and Affect: Mood normal.         Behavior: Behavior normal.       Patient Data       Labs Ordered/Reviewed   URINALYSIS, MACRO/MICRO - Abnormal; Notable for the following components:       Result Value    LEUKOCYTES Trace (*)     All other components within normal limits   URINALYSIS, MICROSCOPIC - Abnormal; Notable for the following components:    BACTERIA Few (*)     SQUAMOUS EPITHELIAL Several (*)     All other components within normal limits   COMPREHENSIVE METABOLIC PANEL, NON-FASTING - Abnormal; Notable for the following components:    GLUCOSE 123 (*)     AST (SGOT) 38 (*)     All other components within normal limits    Narrative:     Estimated Glomerular Filtration Rate (eGFR) is calculated using the CKD-EPI (2021) equation, intended for patients 14 years of age and older. If gender is not documented or "unknown", there will be no eGFR calculation.   CBC WITH DIFF -  Normal   URINE CULTURE,ROUTINE   URINALYSIS WITH REFLEX MICROSCOPIC AND CULTURE IF POSITIVE    Narrative:     The following orders were created for panel order URINALYSIS WITH REFLEX MICROSCOPIC AND CULTURE IF POSITIVE.  Procedure                               Abnormality         Status                     ---------                               -----------         ------                     URINALYSIS, MACRO/MICRO[[id number]]      Abnormal            Final result               URINALYSIS, MICROSCOPIC[[id number]]      Abnormal            Final result                 Please view results for these tests on the individual orders.   CBC/DIFF    Narrative:     The following orders were created for panel order CBC/DIFF.  Procedure                               Abnormality         Status                     ---------                               -----------         ------  CBC WITH DIFF[[id number]]                Normal              Final result                 Please view results for these tests on the individual orders.     CT RENAL CALCULI SERIES WO IV CONTRAST   Final Result by Edi, Radresults In (03/16 1335)   NO ACUTE FINDINGS AT THE ABDOMEN OR PELVIS ON NONCONTRAST CT.                One or more dose reduction techniques were used (e.g., Automated exposure control, adjustment of the mA and/or kV according to patient size, use of iterative reconstruction technique).         Radiologist location ID: [id number]           Medical Decision Making          Medical Decision Making  This 48 year old female presents to the emergency department complaining of left-sided back pain that radiates into the left flank and into the left lower quadrant that started 4 days ago.  She describes the pain as sharp in nature.  She reports history of kidney stones in the past.  She denies dysuria.  She denies fever.  She denies nausea or vomiting.  Physical exam reveals an alert and oriented female in no acute distress.   She does not appear to be having renal colic at this time.  Airway is patent.  Breath sounds are clear and equal bilaterally.  Skin is warm and dry.  Abdomen is soft.  That is CVA tenderness noted.  She was treated here in the emergency department with Toradol, Tylenol, and Robaxin.  The patient was initially treated here in the emergency department with Toradol.  CT of abdomen pelvis shows no acute abnormality.  Labs indicate a WBC of 6.0.  Glucose 123.  AST 38.  Urinalysis negative for urinary tract infection or hematuria.  The patient is most likely suffering from back pain that could be due to a muscle spasm.  She has a history of paraspinal muscle spasms.  She will be discharged and treated on outpatient basis with Robaxin and Motrin.  She was instructed to follow-up with her primary care provider and/or return to the emergency department if worse, no improvement, or as needed.          Problems Addressed:  Acute left flank pain: acute illness or injury  Acute left-sided thoracic back pain: acute illness or injury    Amount and/or Complexity of Data Reviewed  Labs: ordered. Decision-making details documented in ED Course.  Radiology: ordered and independent interpretation performed. Decision-making details documented in ED Course.  ECG/medicine tests: independent interpretation performed.    Risk  OTC drugs.  Prescription drug management.      ED Course as of 02/12/24 1430   Sun Feb 12, 2024   1414 WBC: 6.0  Normal   1414 GLUCOSE(!): 123  Abnormal   1414 AST (SGOT)(!): 38  Abnormal   1415 Urinalysis negative for urinary tract infection.   1415 CT of abdomen and pelvis shows no acute abnormality.   1423 Alert x3 and in no acute distress.  The diagnosis and treatment plan was explained to the patient verbalizes understanding of the discharge instructions.            Medications Ordered/Administered in the ED   ketorolac (TORADOL)  30 mg/mL injection (30 mg Intravenous Given 02/12/24 1246)   methocarbamol (ROBAXIN)  tablet (1,000 mg Oral Given 02/12/24 1406)   acetaminophen (TYLENOL) tablet (650 mg Oral Given 02/12/24 1405)     Clinical Impression   Acute left-sided thoracic back pain (Primary)   Acute left flank pain       Disposition: Discharged

## 2024-02-12 NOTE — Discharge Instructions (Signed)
 Thank you for allowing [place] to be part of your care.  Drink plenty of water.  Take Robaxin and Motrin as directed.  Follow-up with your primary care provider by calling their office tomorrow to schedule a follow-up appointment.  Return to the emergency department if worse, no improvement, or as needed.  We hope you feel better.

## 2024-02-22 ENCOUNTER — Ambulatory Visit (RURAL_HEALTH_CENTER): Payer: MEDICAID | Attending: Family Medicine | Admitting: Family Medicine

## 2024-02-22 ENCOUNTER — Other Ambulatory Visit: Payer: Self-pay

## 2024-02-22 ENCOUNTER — Encounter (RURAL_HEALTH_CENTER): Payer: Self-pay | Admitting: Family Medicine

## 2024-02-22 VITALS — BP 122/84 | HR 60 | Temp 99.6°F | Ht 68.0 in | Wt 239.0 lb

## 2024-02-22 DIAGNOSIS — M5440 Lumbago with sciatica, unspecified side: Secondary | ICD-10-CM | POA: Insufficient documentation

## 2024-02-22 DIAGNOSIS — Z6836 Body mass index (BMI) 36.0-36.9, adult: Secondary | ICD-10-CM | POA: Insufficient documentation

## 2024-02-22 DIAGNOSIS — M549 Dorsalgia, unspecified: Secondary | ICD-10-CM | POA: Insufficient documentation

## 2024-02-22 DIAGNOSIS — G8929 Other chronic pain: Secondary | ICD-10-CM | POA: Insufficient documentation

## 2024-02-22 DIAGNOSIS — E669 Obesity, unspecified: Secondary | ICD-10-CM | POA: Insufficient documentation

## 2024-02-22 LAB — POCT URINE DIPSTICK
BLOOD: NEGATIVE
GLUCOSE: NEGATIVE
KETONE: 15
LEUKOCYTES: NEGATIVE
NITRITE: NEGATIVE
PH: 5.5
PROTEIN: 100
SPECIFIC GRAVITY: 1.03
UROBILINOGEN: 1

## 2024-02-22 MED ORDER — GABAPENTIN 800 MG TABLET
800.0000 mg | ORAL_TABLET | Freq: Four times a day (QID) | ORAL | 0 refills | Status: DC
Start: 2024-02-22 — End: 2024-03-27

## 2024-02-22 NOTE — Progress Notes (Signed)
 FAMILY MEDICINE, [facility] FAMILY MEDICINE [facility]  [address]  [place] [address]  Operated by [facility]     Name: Lindsay Crawford MRN:  [id number]   Date of Birth: March 16, 1976 Age: 48 y.o.   Date: 02/22/2024  Time: 14:49     Provider: Reeda Canner, DO      Assessment/Plan:  Problem List Items Addressed This Visit    None  Visit Diagnoses         Chronic low back pain with sciatica, sciatica laterality unspecified, unspecified back pain laterality    -  Primary    Relevant Orders    POCT Urine Dipstick (Completed)      BMI 36.0-36.9,adult                Orders Placed This Encounter    POCT Urine Dipstick    gabapentin  (NEURONTIN ) 800 mg Oral Tablet         Urine was okay.  The patient feels like her nerve pain may be helped better with the Neurontin  that it was with the Norco.  Today, however, she had to take Norco, Flexeril , and Robaxin  to be able to sit for some time and not be in terrible pain.  She does have Norco on file and I am going to leave it on file.  I am going to add gabapentin  for the next month.  I did warn her not to take this with any kind of alcohol and not to mix the gabapentin  with the Norco at the same time.  Long term, I do not plan on having her on either one of these medicines, but she needs to be able to exercise and move so that she can get her BMI to less than 35 so that they can do the procedure.  She is already following with the vascular surgeon; he is going to have to move her vasculature so that they can do an anterior approach for her surgery.  The orthopedic surgeon isn't willing to do the surgery until her BMI is less than 35, and she is taking the mounjaro samples     History of Present Illness:  Lindsay Crawford is a 48 y.o. female with gastroparesis, GERD, obesity, borderline diabetes, chronic low back pain and DUB (btl and ablation) presents in f/u    3/16 WENT TO THE er. She Had left sided pain. Thought she had a kidney  stone, and she went to the ER. Was r/o for kidney stone on left. Was told musculoskeletal. She is supposed to have surgery on her back but her BMI has to be less than 35.    She has leftt sided low back pain that radiates down her leg.     Historical Data       Past Medical History:       Past Medical History:   Diagnosis Date    Body mass index 35.0-35.9, adult      Borderline diabetes      Chronic low back pain      Dizziness      DUB (dysfunctional uterine bleeding)      Gastroparesis      GERD (gastroesophageal reflux disease)      Hyperglycemia      Hyperlipidemia LDL goal <70      Insomnia      Knee pain, bilateral      Migraine      Paraspinal muscle spasm      Vitamin  D deficiency              Past Surgical History:        Past Surgical History:   Procedure Laterality Date    CESAREAN SECTION   2004    COLONOSCOPY         Dr. Britta Candy in [place]    ESOPHAGOSCOPY / EGD        HX APPENDECTOMY        HX BACK SURGERY        HX LAP CHOLECYSTECTOMY                Allergies:        Allergies   Allergen Reactions    Other Rash       Allergic to the sun       Medications:  medroxyPROGESTERone (PROVERA) 10 mg Oral Tablet, Take 1 Tablet (10 mg total) by mouth Twice daily  polyethylene glycol (MIRALAX ) 17 gram/dose Oral Powder, Take 1 g by mouth Once a day  bethanechol  chloride (URECHOLINE ) 5 mg Oral Tablet, Take 1 Tablet (5 mg total) by mouth Twice daily for 90 days  cetirizine  (ZYRTEC ) 10 mg Oral Tablet, Take 1 Tablet (10 mg total) by mouth Once a day for 90 days  cholecalciferol , vitamin D3, 1,250 mcg (50,000 unit) Oral Capsule, Take 1 Capsule (50,000 Units total) by mouth Every 7 days for 90 days  cyclobenzaprine  (FLEXERIL ) 10 mg Oral Tablet, Take 1 Tablet (10 mg total) by mouth Twice daily for 90 days  famotidine  (PEPCID ) 40 mg Oral Tablet, Take 1 Tablet (40 mg total) by mouth Once a day for 90 days  linaCLOtide  (LINZESS ) 145 mcg Oral Capsule, Take 1 Capsule (145 mcg total) by mouth Every morning for 90  days  meloxicam  (MOBIC ) 7.5 mg Oral Tablet, Take 1 Tablet (7.5 mg total) by mouth Once a day for 90 days  metoclopramide  HCl (REGLAN ) 5 mg Oral Tablet, Take 1 Tablet (5 mg total) by mouth Twice daily for 90 days  omeprazole  (PRILOSEC) 40 mg Oral Capsule, Delayed Release(E.C.), Take 1 Capsule (40 mg total) by mouth Once a day for 90 days  rosuvastatin  (CRESTOR ) 5 mg Oral Tablet, Take 1 Tablet (5 mg total) by mouth Once a day for 90 days  topiramate  (TOPAMAX ) 100 mg Oral Tablet, Take 1 Tablet (100 mg total) by mouth Twice daily for 90 days  TraZODone  (DESYREL ) 300 mg Oral Tablet, Take 1 Tablet (300 mg total) by mouth Once a day for 30 days     No facility-administered medications prior to visit.     Family History:       Family Medical History:      Problem Relation (Age of Onset)     COPD Father     Coronary Artery Disease Brother     Diabetes type II Father     Hypertension (High Blood Pressure) Mother, Father     Kidney Disease Father     Throat cancer Sister             Social History:  Social History            Socioeconomic History    Marital status: Widowed    Number of children: 1    Years of education: 12+    Highest education level: Associate degree: occupational, Scientist, product/process development, or vocational program   Tobacco Use    Smoking status: Never    Smokeless tobacco: Never   Vaping Use    Vaping Use: Never  used   Substance and Sexual Activity    Alcohol use: Never    Drug use: Never    Sexual activity: Yes       Partners: Male       Birth control/protection: None               Review of Systems:  Any pertinent Review of Systems as addressed in the HPI above.     Physical Exam:  Vital Signs:      Vitals:     05/25/22 0954   BP: 110/70   Pulse: 55   SpO2: 96%   Weight: 110 kg (242 lb)   Height: 1.727 m (5\' 8" )   BMI: 36.87      Physical Exam  Vitals reviewed.   Constitutional:       Appearance: She is obese.   HENT:      Head: Normocephalic and atraumatic.      Right Ear: Tympanic membrane normal. There is no impacted  cerumen.      Left Ear: Tympanic membrane normal. There is no impacted cerumen.      Nose: Nose normal.   Neck:      Vascular: No carotid bruit.   Cardiovascular:      Rate and Rhythm: Normal rate and regular rhythm.      Pulses: Normal pulses.      Heart sounds: Normal heart sounds.   Pulmonary:      Effort: Pulmonary effort is normal.      Breath sounds: Normal breath sounds. No wheezing, rhonchi or rales.   Musculoskeletal:      Right lower leg: No edema.      Left lower leg: No edema.   Ttp over Si joints bilaterally with paraspinal spasm  Has crepitance of neck  Lymphadenopathy:      Cervical: No cervical adenopathy.   Skin:     General: Skin is warm and dry.      Capillary Refill: Capillary refill takes less than 2 seconds.   Incision sites are healing with some erythema. No calor. No rebound or guarding  Neurological:      General: No focal deficit present.      Mental Status: She is alert and oriented to person, place, and time.             Reeda Canner, DO      Portions of this note may be dictated using voice recognition software or a dictation service. Variances in spelling and vocabulary are possible and unintentional. Not all errors are caught/corrected. Please notify the [facility]man if any discrepancies are noted or if the meaning of any statement is not clear.

## 2024-03-07 ENCOUNTER — Encounter (RURAL_HEALTH_CENTER): Payer: Self-pay | Admitting: Family Medicine

## 2024-03-15 ENCOUNTER — Ambulatory Visit: Payer: MEDICAID | Attending: Family | Admitting: Family

## 2024-03-15 ENCOUNTER — Encounter (RURAL_HEALTH_CENTER): Payer: Self-pay | Admitting: Family

## 2024-03-15 ENCOUNTER — Other Ambulatory Visit: Payer: Self-pay

## 2024-03-15 ENCOUNTER — Ambulatory Visit (RURAL_HEALTH_CENTER): Payer: MEDICAID | Attending: Family | Admitting: Family

## 2024-03-15 VITALS — BP 130/79 | HR 84 | Temp 98.5°F | Resp 17 | Ht 68.0 in | Wt 240.4 lb

## 2024-03-15 DIAGNOSIS — N390 Urinary tract infection, site not specified: Secondary | ICD-10-CM | POA: Insufficient documentation

## 2024-03-15 LAB — POCT URINE DIPSTICK
BILIRUBIN: NEGATIVE
BLOOD: NEGATIVE
GLUCOSE: NEGATIVE
KETONE: NEGATIVE
NITRITE: NEGATIVE
PH: 6
SPECIFIC GRAVITY: 1.02
UROBILINOGEN: 0.2

## 2024-03-15 MED ORDER — NITROFURANTOIN MONOHYDRATE/MACROCRYSTALS 100 MG CAPSULE
100.0000 mg | ORAL_CAPSULE | Freq: Two times a day (BID) | ORAL | 0 refills | Status: AC
Start: 2024-03-15 — End: 2024-03-22

## 2024-03-15 MED ORDER — PHENAZOPYRIDINE 100 MG TABLET
100.0000 mg | ORAL_TABLET | Freq: Three times a day (TID) | ORAL | 0 refills | Status: AC
Start: 2024-03-15 — End: 2024-03-18

## 2024-03-15 NOTE — Nursing Note (Signed)
 Patient here today for possible UTI. Patient states that this started 3 days ago.

## 2024-03-15 NOTE — Progress Notes (Signed)
 FAMILY MEDICINE, [facility] FAMILY MEDICINE [facility]  [address]  [place] [address]  Operated by [facility]     Name: Lindsay Crawford MRN:  [id number]   Date of Birth: 1976-07-21 Age: 48 y.o.   Date: 03/15/2024  Time: 15:01     Provider: Guido Leeks, FNP    Reason for visit: Uti's      History of Present Illness:  Lindsay Crawford is a 48 y.o. female presents to the clinic with c/o lower abdominal pain that radiates to her back.  She tells me she has some cramping and burning with urination. Denies any fevers      Patient Active Problem List    Diagnosis Date Noted    Acute left-sided thoracic back pain 02/12/2024    Acute left flank pain 02/12/2024    Bilateral hand pain 10/31/2023    Xerosis cutis 10/31/2023    Ankle edema, bilateral 10/31/2023    Chronic right-sided low back pain 09/14/2023    Kidney stone 05/11/2023    Irregular periods/menstrual cycles 02/07/2023    Menopausal symptoms 12/29/2022    Hair loss 12/29/2022    Vaginal discharge 11/11/2022    Fibroids, intramural 11/11/2022    S/P Nissen fundoplication (without gastrostomy tube) procedure 08/25/2022    Pelvic pain 07/27/2022    Menorrhagia with irregular cycle 07/27/2022    DUB (dysfunctional uterine bleeding) 05/25/2022    Gastroparesis 05/25/2022    Hyperlipidemia LDL goal <70 05/25/2022    Insomnia 05/25/2022    Vitamin D deficiency 05/25/2022    Paraspinal muscle spasm 05/25/2022    Migraine 05/25/2022    GERD (gastroesophageal reflux disease) 05/25/2022    Knee pain, right 02/03/2022    Obesity (BMI 35.0-39.9 without comorbidity) 02/03/2022    Borderline diabetes 02/03/2022    Irritable bowel syndrome with constipation 02/03/2022    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 Britta Candy in [place]    ENDOMETRIAL ABLATION W/ NOVASURE      dr brodnik 12/14/22    ESOPHAGOSCOPY / EGD      HX APPENDECTOMY      HX BACK SURGERY      november 2019    HX LAP CHOLECYSTECTOMY      LAPAROSCOPIC TUBAL LIGATION  07/30/2022    with D&C brodmik    NISSEN FUNDOPLICATION  08/13/2022    had in [place]     Allergies:  Allergies   Allergen Reactions    Other Rash     Allergic to the sun      Medications:  Current Outpatient Medications   Medication Sig  albuterol sulfate (PROVENTIL OR VENTOLIN OR PROAIR) 90 mcg/actuation Inhalation oral inhaler Take 2 Puffs by inhalation Every 6 hours as needed    bethanechol chloride (URECHOLINE) 10 mg Oral Tablet Take 1 Tablet (10 mg total) by mouth Twice daily    Biotin 10 mg Oral Tablet Take 1 Tablet (10 mg total) by mouth Daily    cetirizine (ZYRTEC) 10 mg Oral Tablet Take 1 Tablet (10 mg total) by mouth Once a day    Colestipol (COLESTID) 1 gram Oral Tablet Take 2 Tablets (2 g total) by mouth Daily Takes two in am    cyanocobalamin (VITAMIN B 12) 1,000 mcg Oral Tablet Take 1 Tablet (1,000 mcg total) by mouth Daily    cyclobenzaprine (FLEXERIL) 10 mg Oral Tablet Take 1 Tablet (10 mg total) by mouth Twice daily    famotidine (PEPCID) 40 mg Oral Tablet Take 1 Tablet (40 mg total) by mouth Every evening    gabapentin (NEURONTIN) 800 mg Oral Tablet Take 1 Tablet (800 mg total) by mouth Four times a day    HYDROcodone-acetaminophen (NORCO) 7.5-325 mg Oral Tablet Take 1 Tablet by mouth Every 8 hours as needed for Pain for up to 29 days    Ibuprofen (MOTRIN) 600 mg Oral Tablet Take 1 Tablet (600 mg total) by mouth Four times a day as  needed for Pain (Patient not taking: Reported on 03/15/2024)    linaCLOtide (LINZESS) 145 mcg Oral Capsule Take 1 Capsule (145 mcg total) by mouth Every morning    metoclopramide HCl (REGLAN) 5 mg Oral Tablet Take 1 Tablet (5 mg total) by mouth Three times a day Patient taking twice a day    naloxone (NARCAN) 4 mg per spray nasal spray 1 Spray by INTRANASAL route Every 2 minutes as needed for actual or suspected opioid overdose. Call 911 if used.    nitrofurantoin monohyd/m-cryst (MACROBID) 100 mg Oral Capsule Take 1 Capsule (100 mg total) by mouth Twice daily for 7 days Indications: bacterial urinary tract infection    omeprazole (PRILOSEC) 40 mg Oral Capsule, Delayed Release(E.C.) Take 1 Capsule (40 mg total) by mouth Twice daily Patient states she is taking 20 mg twice a day not 40 mg    phenazopyridine (PYRIDIUM) 100 mg Oral Tablet Take 1 Tablet (100 mg total) by mouth Three times a day for 3 days Indications: difficult or painful urination, urinary tract irritation    QUEtiapine (SEROQUEL) 100 mg Oral Tablet Take 1 Tablet (100 mg total) by mouth Twice daily    rosuvastatin (CRESTOR) 5 mg Oral Tablet Take 1 Tablet (5 mg total) by mouth Once a day    venlafaxine (EFFEXOR XR) 150 mg Oral Capsule, Sust. Release 24 hr Take 1 Capsule (150 mg total) by mouth Once a day     Family History:  Family Medical History:       Problem Relation (Age of Onset)    COPD Father    Coronary Artery Disease Brother    Diabetes type II Father    Hypertension (High Blood Pressure) Mother, Father    Kidney Disease Father    No Known Problems Half-Sister, Half-Brother, Maternal Aunt, Maternal Uncle, Paternal Aunt, Paternal Uncle, Maternal Grandmother, Maternal Grandfather, Paternal Grandmother, Paternal Grandfather, Daughter, Son    Throat cancer Sister            Social History:  Social History     Socioeconomic History    Marital status: Widowed    Number of children: 1    Years of  education: 12+    Highest education level: Associate  degree: occupational, Scientist, product/process development, or vocational program   Tobacco Use    Smoking status: Never    Smokeless tobacco: Never   Vaping Use    Vaping status: Never Used   Substance and Sexual Activity    Alcohol use: Never    Drug use: Never    Sexual activity: Yes     Partners: Male     Birth control/protection: None     Social Determinants of Health     Financial Resource Strain: Low Risk  (12/29/2022)    Financial Resource Strain     SDOH Financial: No   Transportation Needs: Low Risk  (12/29/2022)    Transportation Needs     SDOH Transportation: No   Social Connections: Low Risk  (12/29/2022)    Social Connections     SDOH Social Isolation: 5 or more times a week   Intimate Partner Violence: Low Risk  (12/29/2022)    Intimate Partner Violence     SDOH Domestic Violence: No   Housing Stability: Low Risk  (12/29/2022)    Housing Stability     SDOH Housing Situation: I have housing.     SDOH Housing Worry: No           Review of Systems:  Any pertinent Review of Systems as addressed in the HPI above.    Physical Exam:  Vital Signs:  Vitals:    03/15/24 1443   BP: 130/79   Pulse: 84   Resp: 17   Temp: 36.9 C (98.5 F)   TempSrc: Temporal   SpO2: 97%   Weight: 109 kg (240 lb 6 oz)   Height: 1.727 m (5\' 8" )   BMI: 36.55     Physical Exam  Vitals reviewed.   Constitutional:       Appearance: Normal appearance.   HENT:      Head: Normocephalic and atraumatic.   Eyes:      General: No scleral icterus.     Conjunctiva/sclera: Conjunctivae normal.   Cardiovascular:      Rate and Rhythm: Normal rate.   Pulmonary:      Effort: Pulmonary effort is normal.   Abdominal:      Palpations: Abdomen is soft.      Tenderness: There is abdominal tenderness in the suprapubic area.   Skin:     Coloration: Skin is not cyanotic or pale.   Neurological:      Mental Status: She is alert and oriented to person, place, and time. Mental status is at baseline.          Assessment/Plan:  (N39.0) UTI (urinary tract infection)  (primary encounter  diagnosis)  Plan: POCT Urine Dipstick, URINE CULTURE     POC in negative for blood and ketones, Trace Leuks.   Will send for C&S.   Treat for symptomatic UTI with Macrobid bid x 5 days and pyridum for discomfort.  Encouraged to drink lots of fluids.     Orders Placed This Encounter    URINE CULTURE    POCT Urine Dipstick    nitrofurantoin monohyd/m-cryst (MACROBID) 100 mg Oral Capsule    phenazopyridine (PYRIDIUM) 100 mg Oral Tablet         Return if symptoms worsen or fail to improve.    Shakyra Mattera L Makynleigh Breslin, FNP-C     Portions of this note may be dictated using voice recognition software or a dictation service. Variances in spelling and vocabulary are possible and  unintentional. Not all errors are caught/corrected. Please notify the [facility] if any discrepancies are noted or if the meaning of any statement is not clear.

## 2024-03-18 ENCOUNTER — Ambulatory Visit (RURAL_HEALTH_CENTER): Payer: Self-pay | Admitting: Family

## 2024-03-18 LAB — URINE CULTURE: URINE CULTURE: 50000 — AB

## 2024-03-18 NOTE — Result Encounter Note (Signed)
 Patient treated for symptomatic urinary tract infection.

## 2024-03-27 ENCOUNTER — Encounter (RURAL_HEALTH_CENTER): Payer: Self-pay | Admitting: Family Medicine

## 2024-03-27 ENCOUNTER — Ambulatory Visit (RURAL_HEALTH_CENTER): Payer: MEDICAID | Attending: Family Medicine | Admitting: Family Medicine

## 2024-03-27 ENCOUNTER — Ambulatory Visit: Payer: MEDICAID | Attending: Family Medicine | Admitting: Family Medicine

## 2024-03-27 ENCOUNTER — Other Ambulatory Visit: Payer: Self-pay

## 2024-03-27 VITALS — BP 126/80 | HR 69 | Temp 98.8°F | Ht 68.0 in | Wt 236.0 lb

## 2024-03-27 DIAGNOSIS — E669 Obesity, unspecified: Secondary | ICD-10-CM | POA: Insufficient documentation

## 2024-03-27 DIAGNOSIS — M5416 Radiculopathy, lumbar region: Secondary | ICD-10-CM

## 2024-03-27 DIAGNOSIS — R739 Hyperglycemia, unspecified: Secondary | ICD-10-CM | POA: Insufficient documentation

## 2024-03-27 DIAGNOSIS — Z5181 Encounter for therapeutic drug level monitoring: Secondary | ICD-10-CM | POA: Insufficient documentation

## 2024-03-27 DIAGNOSIS — Z6836 Body mass index (BMI) 36.0-36.9, adult: Secondary | ICD-10-CM | POA: Insufficient documentation

## 2024-03-27 LAB — URINALYSIS, MACROSCOPIC
BILIRUBIN: NEGATIVE mg/dL
BLOOD: NEGATIVE mg/dL
GLUCOSE: NEGATIVE mg/dL
KETONES: NEGATIVE mg/dL
LEUKOCYTES: NEGATIVE WBCs/uL
NITRITE: NEGATIVE
PH: 6 (ref 5.0–9.0)
PROTEIN: NEGATIVE mg/dL
SPECIFIC GRAVITY: 1.024 (ref 1.002–1.030)
UROBILINOGEN: NORMAL mg/dL

## 2024-03-27 LAB — CBC WITH DIFF
BASOPHIL #: 0.2 10*3/uL — ABNORMAL HIGH (ref 0.00–0.10)
BASOPHIL %: 1 % (ref 0–1)
EOSINOPHIL #: 0.1 10*3/uL (ref 0.00–0.50)
EOSINOPHIL %: 1 % (ref 1–7)
HCT: 43.2 % — ABNORMAL HIGH (ref 31.2–41.9)
HGB: 14.9 g/dL — ABNORMAL HIGH (ref 10.9–14.3)
LYMPHOCYTE #: 3.3 10*3/uL — ABNORMAL HIGH (ref 1.10–3.10)
LYMPHOCYTE %: 25 % (ref 16–46)
MCH: 29.7 pg (ref 24.7–32.8)
MCHC: 34.5 g/dL (ref 32.3–35.6)
MCV: 86.1 fL (ref 75.5–95.3)
MONOCYTE #: 0.6 10*3/uL (ref 0.20–0.90)
MONOCYTE %: 5 % (ref 4–11)
MPV: 7.9 fL (ref 7.9–10.8)
NEUTROPHIL #: 8.8 10*3/uL — ABNORMAL HIGH (ref 1.90–8.20)
NEUTROPHIL %: 68 % (ref 43–77)
PLATELETS: 319 10*3/uL (ref 140–440)
RBC: 5.02 10*6/uL — ABNORMAL HIGH (ref 3.63–4.92)
RDW: 13.5 % (ref 12.3–17.7)
WBC: 13 10*3/uL — ABNORMAL HIGH (ref 3.8–11.8)

## 2024-03-27 LAB — BASIC METABOLIC PANEL
ANION GAP: 7 mmol/L (ref 4–13)
BUN/CREA RATIO: 24 — ABNORMAL HIGH (ref 6–22)
BUN: 20 mg/dL (ref 7–25)
CALCIUM: 9.9 mg/dL (ref 8.6–10.3)
CHLORIDE: 103 mmol/L (ref 98–107)
CO2 TOTAL: 29 mmol/L (ref 21–31)
CREATININE: 0.85 mg/dL (ref 0.60–1.30)
ESTIMATED GFR: 86 mL/min/{1.73_m2} (ref >59)
GLUCOSE: 86 mg/dL (ref 74–109)
OSMOLALITY, CALCULATED: 280 mosm/kg (ref 270–290)
POTASSIUM: 4.1 mmol/L (ref 3.5–5.1)
SODIUM: 139 mmol/L (ref 136–145)

## 2024-03-27 LAB — DRUG SCREEN, WITH CONFIRMATION, URINE
AMPHETAMINES URINE: NEGATIVE
BARBITURATES URINE: NEGATIVE
BENZODIAZEPINES URINE: NEGATIVE
BUPRENORPHINE URINE: NEGATIVE
CANNABINOIDS URINE: NEGATIVE
COCAINE METABOLITES URINE: NEGATIVE
FENTANYL, URINE: NEGATIVE
METHADONE URINE: NEGATIVE
OPIATES URINE: POSITIVE — AB
OXYCODONE URINE: NEGATIVE
PCP URINE: NEGATIVE

## 2024-03-27 LAB — URINALYSIS, MICROSCOPIC
RBCS: 1 /HPF (ref <4)
SQUAMOUS EPITHELIAL: 9 /HPF (ref <28)
WBCS: 1 /HPF (ref <6)

## 2024-03-27 MED ORDER — HYDROCODONE 7.5 MG-ACETAMINOPHEN 325 MG TABLET
1.0000 | ORAL_TABLET | Freq: Three times a day (TID) | ORAL | 0 refills | Status: AC
Start: 2024-03-27 — End: 2024-04-25

## 2024-03-27 MED ORDER — METFORMIN 1,000 MG TABLET
1000.0000 mg | ORAL_TABLET | Freq: Two times a day (BID) | ORAL | 3 refills | Status: DC
Start: 2024-03-27 — End: 2024-05-28

## 2024-03-27 MED ORDER — GABAPENTIN 800 MG TABLET
800.0000 mg | ORAL_TABLET | Freq: Four times a day (QID) | ORAL | 2 refills | Status: DC
Start: 2024-03-27 — End: 2024-05-28

## 2024-03-27 MED ORDER — HYDROCODONE 7.5 MG-ACETAMINOPHEN 325 MG TABLET
1.0000 | ORAL_TABLET | Freq: Three times a day (TID) | ORAL | 0 refills | Status: DC
Start: 2024-04-26 — End: 2024-05-28

## 2024-03-27 NOTE — Progress Notes (Signed)
 FAMILY MEDICINE, [facility] FAMILY MEDICINE [facility]  [address]  [place] [address]  Operated by [facility]     Name: Lindsay Crawford MRN:  [id number]   Date of Birth: [birth year] Age: 48 y.o.   Date: 03/27/2024  Time: 16:43     Provider: Reeda Canner, DO      Assessment/Plan:  Problem List Items Addressed This Visit          Other    Obesity (BMI 35.0-39.9 without comorbidity) - Primary    Relevant Orders    URINALYSIS, MACROSCOPIC AND MICROSCOPIC W/CULTURE REFLEX (Completed)     Other Visit Diagnoses         Hyperglycemia        Relevant Orders    INSULIN , SERUM (Completed)    CBC/DIFF (Completed)    BASIC METABOLIC PANEL (Completed)      Therapeutic drug monitoring        Relevant Orders    DRUG SCREEN, WITH CONFIRMATION, URINE (Completed)    OPIATES CONFIRMATORY/DEFINITIVE, URINE, BY LC-MS/MS (PERFORMABLE) (Completed)      Lumbar back pain with radiculopathy affecting left lower extremity                Orders Placed This Encounter    INSULIN , SERUM    CBC/DIFF    BASIC METABOLIC PANEL    DRUG SCREEN, WITH CONFIRMATION, URINE    URINALYSIS, MACROSCOPIC AND MICROSCOPIC W/CULTURE REFLEX    CBC WITH DIFF    URINALYSIS, MACROSCOPIC    URINALYSIS, MICROSCOPIC    OPIATES CONFIRMATORY/DEFINITIVE, URINE, BY LC-MS/MS (PERFORMABLE)    HYDROcodone -acetaminophen  (NORCO) 7.5-325 mg Oral Tablet    HYDROcodone -acetaminophen  (NORCO) 7.5-325 mg Oral Tablet    gabapentin  (NEURONTIN ) 800 mg Oral Tablet    MetFORMIN  (GLUCOPHAGE ) 1,000 mg Oral Tablet      Will add metformin  bid. Try 1800 calorie 90 grams of protein diet  Continue daily exercise  Continue mounjaro. Will need to be off from the medicine for 8 days before surgery  Will check basic lab.  Benefits from pain medication, and will continue gabapentin  and hydrocodone     Results for orders placed or performed in visit on 03/27/24 (from the past 4 weeks)   INSULIN , SERUM    Collection Time: 03/27/24 10:05 AM   Result Value  Ref Range    INSULIN  20.5 3.2 - 32.1 mIU/mL   BASIC METABOLIC PANEL    Collection Time: 03/27/24 10:05 AM   Result Value Ref Range    SODIUM 139 136 - 145 mmol/L    POTASSIUM 4.1 3.5 - 5.1 mmol/L    CHLORIDE 103 98 - 107 mmol/L    CO2 TOTAL 29 21 - 31 mmol/L    ANION GAP 7 4 - 13 mmol/L    CALCIUM 9.9 8.6 - 10.3 mg/dL    GLUCOSE 86 74 - 518 mg/dL    BUN 20 7 - 25 mg/dL    CREATININE 8.41 6.60 - 1.30 mg/dL    BUN/CREA RATIO 24 (H) 6 - 22    ESTIMATED GFR 86 >59 mL/min/1.54m^2    OSMOLALITY, CALCULATED 280 270 - 290 mOsm/kg   DRUG SCREEN, WITH CONFIRMATION, URINE    Collection Time: 03/27/24 10:05 AM   Result Value Ref Range    AMPHETAMINES URINE Negative Negative    BARBITURATES URINE Negative Negative    BENZODIAZEPINES URINE Negative Negative    BUPRENORPHINE  URINE Negative Negative    CANNABINOIDS URINE Negative Negative    COCAINE METABOLITES  URINE Negative Negative    FENTANYL , URINE Negative Negative    OPIATES URINE Positive (A) Negative    OXYCODONE URINE Negative Negative    PCP URINE Negative Negative    METHADONE URINE Negative Negative   CBC WITH DIFF    Collection Time: 03/27/24 10:05 AM   Result Value Ref Range    WBC 13.0 (H) 3.8 - 11.8 x10^3/uL    RBC 5.02 (H) 3.63 - 4.92 x10^6/uL    HGB         elevated blood counts 14.9 (H) 10.9 - 14.3 g/dL    HCT 13.0 (H) 86.5 - 41.9 %    MCV 86.1 75.5 - 95.3 fL    MCH 29.7 24.7 - 32.8 pg    MCHC 34.5 32.3 - 35.6 g/dL    RDW 78.4 69.6 - 29.5 %    PLATELETS 319 140 - 440 x10^3/uL    MPV 7.9 7.9 - 10.8 fL    NEUTROPHIL % 68 43 - 77 %    LYMPHOCYTE % 25 16 - 46 %    MONOCYTE % 5 4 - 11 %    EOSINOPHIL % 1 1 - 7 %    BASOPHIL % 1 0 - 1 %    NEUTROPHIL # 8.80 (H) 1.90 - 8.20 x10^3/uL    LYMPHOCYTE # 3.30 (H) 1.10 - 3.10 x10^3/uL    MONOCYTE # 0.60 0.20 - 0.90 x10^3/uL    EOSINOPHIL # 0.10 0.00 - 0.50 x10^3/uL    BASOPHIL # 0.20 (H) 0.00 - 0.10 x10^3/uL   URINALYSIS, MACROSCOPIC    Collection Time: 03/27/24 10:05 AM   Result Value Ref Range    COLOR Light Yellow  Colorless, Light Yellow, Yellow    APPEARANCE Clear Clear    SPECIFIC GRAVITY 1.024 1.002 - 1.030    PH 6.0 5.0 - 9.0    LEUKOCYTES Negative Negative, 100  WBCs/uL    NITRITE Negative Negative    PROTEIN Negative Negative, 10 , 20  mg/dL    GLUCOSE Negative Negative, 30  mg/dL    KETONES Negative Negative, Trace mg/dL    BILIRUBIN Negative Negative, 0.5 mg/dL    BLOOD Negative Negative, 0.03 mg/dL    UROBILINOGEN Normal Normal mg/dL   URINALYSIS, MICROSCOPIC    Collection Time: 03/27/24 10:05 AM   Result Value Ref Range    MUCOUS Rare Rare, Occasional, Few /hpf    RBCS 1 <4 /hpf    WBCS 1 <6 /hpf    SQUAMOUS EPITHELIAL 9 <28 /hpf   OPIATES CONFIRMATORY/DEFINITIVE, URINE, BY LC-MS/MS (PERFORMABLE)    Collection Time: 03/27/24 10:05 AM   Result Value Ref Range    OPIATES INTERPRETATION Hydrocodone  signature (A) Negative    CODEINE Not Detected <25 ng/mL    DIHYDROCODEINE 206 (H) <25 ng/mL    HYDROCODONE   consistent 489 (H) <25 ng/mL    HYDROMORPHONE  100 (H) <25 ng/mL    MORPHINE  Not Detected <25 ng/mL    NORHYDROCODONE 893 (H) <25 ng/mL    NOROXYCODONE Not Detected <25 ng/mL    OXYCODONE Not Detected <25 ng/mL    OXYMORPHONE Not Detected <25 ng/mL   Results for orders placed or performed in visit on 03/15/24 (from the past 4 weeks)   POCT Urine Dipstick    Collection Time: 03/15/24 12:00 AM   Result Value Ref Range    LEUKOCYTES Trace     NITRITE Negative     UROBILINOGEN 0.2     PROTEIN trace  PH 6.0     BLOOD negative     SPECIFIC GRAVITY 1.020     KETONE negative     BILIRUBIN negative     GLUCOSE negative     COLOR Yellow     CLARITY cloudy    URINE CULTURE    Collection Time: 03/15/24  2:57 PM    Specimen: Urine, Clean Catch   Result Value Ref Range    URINE CULTURE 50,000 CFU/mL Mixed Flora (A)            History of Present Illness:  Lindsay Crawford is a 48 y.o. female with gastroparesis, GERD, obesity, borderline diabetes, chronic low back pain and DUB (btl and ablation) presents in f/u    Weight has to be 230  lbs before he will do surgery on her back. She has been using mounjaro samples to try to lose weight prior to surgery. Has hx of hyperglycemia but not diabetic. Using off label in attempt to get the weight off and decrease her cravings which it is doing.    She has leftt sided low back pain that radiates down her leg. Using hydrocodone  tid and gabapentin  qid. Will need a refill of the pain medication. She is able to do some light housekeeping and meal prep with use of the medication.     Historical Data       Past Medical History:       Past Medical History:   Diagnosis Date    Body mass index 35.0-35.9, adult      Borderline diabetes      Chronic low back pain      Dizziness      DUB (dysfunctional uterine bleeding)      Gastroparesis      GERD (gastroesophageal reflux disease)      Hyperglycemia      Hyperlipidemia LDL goal <70      Insomnia      Knee pain, bilateral      Migraine      Paraspinal muscle spasm      Vitamin D  deficiency              Past Surgical History:        Past Surgical History:   Procedure Laterality Date    CESAREAN SECTION   2004    COLONOSCOPY         Dr. Britta Candy in [place]    ESOPHAGOSCOPY / EGD        HX APPENDECTOMY        HX BACK SURGERY        HX LAP CHOLECYSTECTOMY                Allergies:        Allergies   Allergen Reactions    Other Rash       Allergic to the sun       Medications:  medroxyPROGESTERone (PROVERA) 10 mg Oral Tablet, Take 1 Tablet (10 mg total) by mouth Twice daily  polyethylene glycol (MIRALAX ) 17 gram/dose Oral Powder, Take 1 g by mouth Once a day  bethanechol  chloride (URECHOLINE ) 5 mg Oral Tablet, Take 1 Tablet (5 mg total) by mouth Twice daily for 90 days  cetirizine  (ZYRTEC ) 10 mg Oral Tablet, Take 1 Tablet (10 mg total) by mouth Once a day for 90 days  cholecalciferol , vitamin D3, 1,250 mcg (50,000 unit) Oral Capsule, Take 1 Capsule (50,000 Units total) by mouth Every 7 days for 90 days  cyclobenzaprine  (  FLEXERIL ) 10 mg Oral Tablet, Take 1 Tablet (10 mg  total) by mouth Twice daily for 90 days  famotidine  (PEPCID ) 40 mg Oral Tablet, Take 1 Tablet (40 mg total) by mouth Once a day for 90 days  linaCLOtide  (LINZESS ) 145 mcg Oral Capsule, Take 1 Capsule (145 mcg total) by mouth Every morning for 90 days  meloxicam  (MOBIC ) 7.5 mg Oral Tablet, Take 1 Tablet (7.5 mg total) by mouth Once a day for 90 days  metoclopramide  HCl (REGLAN ) 5 mg Oral Tablet, Take 1 Tablet (5 mg total) by mouth Twice daily for 90 days  omeprazole  (PRILOSEC) 40 mg Oral Capsule, Delayed Release(E.C.), Take 1 Capsule (40 mg total) by mouth Once a day for 90 days  rosuvastatin  (CRESTOR ) 5 mg Oral Tablet, Take 1 Tablet (5 mg total) by mouth Once a day for 90 days  topiramate  (TOPAMAX ) 100 mg Oral Tablet, Take 1 Tablet (100 mg total) by mouth Twice daily for 90 days  TraZODone  (DESYREL ) 300 mg Oral Tablet, Take 1 Tablet (300 mg total) by mouth Once a day for 30 days     No facility-administered medications prior to visit.     Family History:       Family Medical History:      Problem Relation (Age of Onset)     COPD Father     Coronary Artery Disease Brother     Diabetes type II Father     Hypertension (High Blood Pressure) Mother, Father     Kidney Disease Father     Throat cancer Sister             Social History:  Social History            Socioeconomic History    Marital status: Widowed    Number of children: 1    Years of education: 12+    Highest education level: Associate degree: occupational, Scientist, product/process development, or vocational program   Tobacco Use    Smoking status: Never    Smokeless tobacco: Never   Vaping Use    Vaping Use: Never used   Substance and Sexual Activity    Alcohol use: Never    Drug use: Never    Sexual activity: Yes       Partners: Male       Birth control/protection: None               Review of Systems:  Any pertinent Review of Systems as addressed in the HPI above.     Physical Exam:  Vital Signs:      Vitals:     05/25/22 0954   BP: 110/70   Pulse: 55   SpO2: 96%   Weight: 110 kg  (242 lb)   Height: 1.727 m (5\' 8" )   BMI: 36.87      Physical Exam  Vitals reviewed.   Constitutional:       Appearance: She is obese.   HENT:      Head: Normocephalic and atraumatic.      Right Ear: Tympanic membrane normal. There is no impacted cerumen.      Left Ear: Tympanic membrane normal. There is no impacted cerumen.      Nose: Nose normal.   Neck:      Vascular: No carotid bruit.   Cardiovascular:      Rate and Rhythm: Normal rate and regular rhythm.      Pulses: Normal pulses.      Heart sounds: Normal heart sounds.  Pulmonary:      Effort: Pulmonary effort is normal.      Breath sounds: Normal breath sounds. No wheezing, rhonchi or rales.   Musculoskeletal:      Right lower leg: No edema.      Left lower leg: No edema.   Ttp over Si joints bilaterally with paraspinal spasm  Has crepitance of neck  Lymphadenopathy:      Cervical: No cervical adenopathy.   Skin:     General: Skin is warm and dry.      Capillary Refill: Capillary refill takes less than 2 seconds.   Incision sites are healing with some erythema. No calor. No rebound or guarding  Neurological:      General: No focal deficit present.      Mental Status: She is alert and oriented to person, place, and time.             Reeda Canner, DO      Portions of this note may be dictated using voice recognition software or a dictation service. Variances in spelling and vocabulary are possible and unintentional. Not all errors are caught/corrected. Please notify the [facility] if any discrepancies are noted or if the meaning of any statement is not clear.

## 2024-03-28 ENCOUNTER — Telehealth (RURAL_HEALTH_CENTER): Payer: Self-pay | Admitting: Family Medicine

## 2024-03-28 NOTE — Telephone Encounter (Signed)
 Patient called and said the hospital just called her and said that she needed to be under 230 lbs for surgery instead of just the 35 BMI.

## 2024-03-29 ENCOUNTER — Other Ambulatory Visit (RURAL_HEALTH_CENTER): Payer: Self-pay | Admitting: Internal Medicine

## 2024-03-29 ENCOUNTER — Ambulatory Visit (RURAL_HEALTH_CENTER): Payer: Self-pay | Admitting: Family Medicine

## 2024-03-29 LAB — INSULIN, SERUM: INSULIN: 20.5 m[IU]/mL (ref 3.2–32.1)

## 2024-03-30 ENCOUNTER — Encounter (RURAL_HEALTH_CENTER): Payer: Self-pay | Admitting: Family Medicine

## 2024-03-30 LAB — OPIATES CONFIRMATORY/DEFINITIVE, URINE, BY LC-MS/MS (PERFORMABLE)
CODEINE: NOT DETECTED ng/mL (ref <25)
DIHYDROCODEINE: 206 ng/mL — ABNORMAL HIGH (ref <25)
HYDROCODONE: 489 ng/mL — ABNORMAL HIGH (ref <25)
HYDROMORPHONE: 100 ng/mL — ABNORMAL HIGH (ref <25)
MORPHINE: NOT DETECTED ng/mL (ref <25)
NORHYDROCODONE: 893 ng/mL — ABNORMAL HIGH (ref <25)
NOROXYCODONE: NOT DETECTED ng/mL (ref <25)
OXYCODONE: NOT DETECTED ng/mL (ref <25)
OXYMORPHONE: NOT DETECTED ng/mL (ref <25)

## 2024-04-24 ENCOUNTER — Other Ambulatory Visit (RURAL_HEALTH_CENTER): Payer: Self-pay | Admitting: Family Medicine

## 2024-05-18 ENCOUNTER — Other Ambulatory Visit (RURAL_HEALTH_CENTER): Payer: Self-pay | Admitting: Family Medicine

## 2024-05-21 ENCOUNTER — Telehealth (RURAL_HEALTH_CENTER): Payer: Self-pay | Admitting: Family Medicine

## 2024-05-21 NOTE — Telephone Encounter (Signed)
 Patient asking if we have any Mounjaro samples she can have.  Also wants to let you know her surgeon postponed the surgery until October to give her more time to meet the weight loss goal for the best outcome.

## 2024-05-24 ENCOUNTER — Encounter (RURAL_HEALTH_CENTER): Payer: Self-pay | Admitting: Family Medicine

## 2024-05-28 ENCOUNTER — Ambulatory Visit: Payer: MEDICAID | Attending: Family Medicine | Admitting: Family Medicine

## 2024-05-28 ENCOUNTER — Ambulatory Visit (RURAL_HEALTH_CENTER): Payer: MEDICAID | Attending: Family Medicine | Admitting: Family Medicine

## 2024-05-28 ENCOUNTER — Encounter (RURAL_HEALTH_CENTER): Payer: Self-pay | Admitting: Family Medicine

## 2024-05-28 ENCOUNTER — Other Ambulatory Visit: Payer: Self-pay

## 2024-05-28 VITALS — BP 120/80 | HR 65 | Temp 98.9°F | Ht 68.0 in | Wt 245.0 lb

## 2024-05-28 DIAGNOSIS — E785 Hyperlipidemia, unspecified: Secondary | ICD-10-CM | POA: Insufficient documentation

## 2024-05-28 DIAGNOSIS — R739 Hyperglycemia, unspecified: Secondary | ICD-10-CM | POA: Insufficient documentation

## 2024-05-28 DIAGNOSIS — E538 Deficiency of other specified B group vitamins: Secondary | ICD-10-CM | POA: Insufficient documentation

## 2024-05-28 DIAGNOSIS — K219 Gastro-esophageal reflux disease without esophagitis: Secondary | ICD-10-CM | POA: Insufficient documentation

## 2024-05-28 DIAGNOSIS — Z6837 Body mass index (BMI) 37.0-37.9, adult: Secondary | ICD-10-CM | POA: Insufficient documentation

## 2024-05-28 DIAGNOSIS — E559 Vitamin D deficiency, unspecified: Secondary | ICD-10-CM | POA: Insufficient documentation

## 2024-05-28 DIAGNOSIS — Z5181 Encounter for therapeutic drug level monitoring: Secondary | ICD-10-CM | POA: Insufficient documentation

## 2024-05-28 DIAGNOSIS — M5416 Radiculopathy, lumbar region: Secondary | ICD-10-CM | POA: Insufficient documentation

## 2024-05-28 DIAGNOSIS — G43909 Migraine, unspecified, not intractable, without status migrainosus: Secondary | ICD-10-CM | POA: Insufficient documentation

## 2024-05-28 LAB — COMPREHENSIVE METABOLIC PANEL, NON-FASTING
ALBUMIN/GLOBULIN RATIO: 1.6 — ABNORMAL HIGH (ref 0.8–1.4)
ALBUMIN: 4.6 g/dL (ref 3.5–5.7)
ALKALINE PHOSPHATASE: 70 U/L (ref 34–104)
ALT (SGPT): 32 U/L (ref 7–52)
ANION GAP: 7 mmol/L (ref 4–13)
AST (SGOT): 29 U/L (ref 13–39)
BILIRUBIN TOTAL: 0.5 mg/dL (ref 0.3–1.0)
BUN/CREA RATIO: 17 (ref 6–22)
BUN: 13 mg/dL (ref 7–25)
CALCIUM, CORRECTED: 9.3 mg/dL (ref 8.9–10.8)
CALCIUM: 9.8 mg/dL (ref 8.6–10.3)
CHLORIDE: 106 mmol/L (ref 98–107)
CO2 TOTAL: 26 mmol/L (ref 21–31)
CREATININE: 0.78 mg/dL (ref 0.60–1.30)
ESTIMATED GFR: 95 mL/min/1.73mˆ2 (ref >59)
GLOBULIN: 2.8 (ref 2.0–3.5)
GLUCOSE: 110 mg/dL — ABNORMAL HIGH (ref 74–109)
OSMOLALITY, CALCULATED: 278 mosm/kg (ref 270–290)
POTASSIUM: 4.2 mmol/L (ref 3.5–5.1)
PROTEIN TOTAL: 7.4 g/dL (ref 6.4–8.9)
SODIUM: 139 mmol/L (ref 136–145)

## 2024-05-28 LAB — DRUG SCREEN, WITH CONFIRMATION, URINE
AMPHETAMINES URINE: NEGATIVE
BARBITURATES URINE: NEGATIVE
BENZODIAZEPINES URINE: NEGATIVE
BUPRENORPHINE URINE: NEGATIVE
CANNABINOIDS URINE: NEGATIVE
COCAINE METABOLITES URINE: NEGATIVE
FENTANYL, URINE: NEGATIVE
METHADONE URINE: NEGATIVE
OPIATES URINE: POSITIVE — AB
OXYCODONE URINE: NEGATIVE
PCP URINE: NEGATIVE

## 2024-05-28 LAB — CBC WITH DIFF
BASOPHIL #: 0.1 x10ˆ3/uL (ref 0.00–0.10)
BASOPHIL %: 1 % (ref 0–1)
EOSINOPHIL #: 0.3 x10ˆ3/uL (ref 0.00–0.50)
EOSINOPHIL %: 3 % (ref 1–7)
HCT: 40.2 % (ref 31.2–41.9)
HGB: 14.1 g/dL (ref 10.9–14.3)
LYMPHOCYTE #: 1.9 x10ˆ3/uL (ref 1.10–3.10)
LYMPHOCYTE %: 23 % (ref 16–46)
MCH: 30.4 pg (ref 24.7–32.8)
MCHC: 35 g/dL (ref 32.3–35.6)
MCV: 86.8 fL (ref 75.5–95.3)
MONOCYTE #: 0.6 x10ˆ3/uL (ref 0.20–0.90)
MONOCYTE %: 7 % (ref 4–11)
MPV: 8.7 fL (ref 7.9–10.8)
NEUTROPHIL #: 5.4 x10ˆ3/uL (ref 1.90–8.20)
NEUTROPHIL %: 66 % (ref 43–77)
PLATELETS: 236 x10ˆ3/uL (ref 140–440)
RBC: 4.63 x10ˆ6/uL (ref 3.63–4.92)
RDW: 12.8 % (ref 12.3–17.7)
WBC: 8.3 x10ˆ3/uL (ref 3.8–11.8)

## 2024-05-28 LAB — MICROALBUMIN/CREATININE RATIO, URINE, RANDOM
CREATININE RANDOM URINE: 144 mg/dL — ABNORMAL HIGH (ref 30–125)
MICROALBUMIN RANDOM URINE: <0.7 mg/dL

## 2024-05-28 LAB — LIPID PANEL
CHOL/HDL RATIO: 2.8
CHOLESTEROL: 156 mg/dL (ref <200)
HDL CHOL: 55 mg/dL (ref >=40)
LDL CALC: 76 mg/dL (ref 0–100)
TRIGLYCERIDES: 125 mg/dL (ref <=150)
VLDL CALC: 25 mg/dL (ref 0–50)

## 2024-05-28 LAB — VITAMIN B12: VITAMIN B 12: 656 pg/mL (ref 180–914)

## 2024-05-28 LAB — THYROID STIMULATING HORMONE (SENSITIVE TSH): TSH: 1.816 u[IU]/mL (ref 0.450–5.330)

## 2024-05-28 LAB — VITAMIN D 25 TOTAL: VITAMIN D 25, TOTAL: 34.42 ng/mL (ref 30.00–100.00)

## 2024-05-28 LAB — MAGNESIUM: MAGNESIUM: 2 mg/dL (ref 1.9–2.7)

## 2024-05-28 MED ORDER — METFORMIN 1,000 MG TABLET
1000.0000 mg | ORAL_TABLET | Freq: Two times a day (BID) | ORAL | 3 refills | Status: DC
Start: 2024-05-28 — End: 2024-08-30

## 2024-05-28 MED ORDER — HYDROCODONE 7.5 MG-ACETAMINOPHEN 325 MG TABLET
1.0000 | ORAL_TABLET | Freq: Three times a day (TID) | ORAL | 0 refills | Status: DC | PRN
Start: 2024-06-27 — End: 2024-07-17

## 2024-05-28 MED ORDER — LINACLOTIDE 145 MCG CAPSULE
145.0000 ug | ORAL_CAPSULE | Freq: Every morning | ORAL | 3 refills | Status: DC
Start: 2024-05-28 — End: 2024-08-30

## 2024-05-28 MED ORDER — METOCLOPRAMIDE 5 MG TABLET
5.0000 mg | ORAL_TABLET | Freq: Three times a day (TID) | ORAL | 3 refills | Status: DC
Start: 2024-05-28 — End: 2024-08-30

## 2024-05-28 MED ORDER — ROSUVASTATIN 5 MG TABLET
5.0000 mg | ORAL_TABLET | Freq: Every day | ORAL | 0 refills | Status: DC
Start: 2024-05-28 — End: 2024-08-30

## 2024-05-28 MED ORDER — HYDROCODONE 7.5 MG-ACETAMINOPHEN 325 MG TABLET
1.0000 | ORAL_TABLET | Freq: Three times a day (TID) | ORAL | 0 refills | Status: DC | PRN
Start: 2024-07-27 — End: 2024-07-17

## 2024-05-28 MED ORDER — OMEPRAZOLE 40 MG CAPSULE,DELAYED RELEASE
40.0000 mg | DELAYED_RELEASE_CAPSULE | Freq: Two times a day (BID) | ORAL | 3 refills | Status: DC
Start: 2024-05-28 — End: 2024-08-30

## 2024-05-28 MED ORDER — GABAPENTIN 800 MG TABLET
800.0000 mg | ORAL_TABLET | Freq: Four times a day (QID) | ORAL | 2 refills | Status: DC
Start: 2024-05-28 — End: 2024-07-17

## 2024-05-28 MED ORDER — HYDROCODONE 7.5 MG-ACETAMINOPHEN 325 MG TABLET
1.0000 | ORAL_TABLET | Freq: Three times a day (TID) | ORAL | 0 refills | Status: DC | PRN
Start: 2024-05-28 — End: 2024-07-17

## 2024-05-28 MED ORDER — VENLAFAXINE ER 150 MG CAPSULE,EXTENDED RELEASE 24 HR
150.0000 mg | ORAL_CAPSULE | Freq: Every day | ORAL | 3 refills | Status: DC
Start: 2024-05-28 — End: 2024-08-30

## 2024-05-28 MED ORDER — LIRAGLUTIDE (WEIGHT LOSS) 3 MG/0.5 ML (18 MG/3 ML) SUBCUT PEN INJECTOR
PEN_INJECTOR | SUBCUTANEOUS | 5 refills | Status: DC
Start: 2024-05-28 — End: 2024-07-17

## 2024-05-28 NOTE — Progress Notes (Signed)
 FAMILY MEDICINE, [facility] FAMILY MEDICINE [facility]  [address]  [place] [address]  Operated by [facility]     Name: [name] MRN:  [id number]   Date of Birth: 1976-01-24 Age: 48 y.o.   Date: 05/28/2024  Time: 11:33     Provider: Jhonny Romano, DO    Assessment/Plan:    Problem List Items Addressed This Visit          Neurologic    Migraine       Digestive    GERD (gastroesophageal reflux disease)    Relevant Orders    CBC/DIFF    COMPREHENSIVE METABOLIC PANEL, NON-FASTING    THYROID  STIMULATING HORMONE (SENSITIVE TSH)       Endocrine    Vitamin D  deficiency    Relevant Orders    VITAMIN D  25 TOTAL     Other Visit Diagnoses         Lumbar back pain with radiculopathy affecting left lower extremity    -  Primary      BMI 37.0-37.9, adult          Therapeutic drug monitoring        Relevant Orders    DRUG SCREEN, WITH CONFIRMATION, URINE      Hyperglycemia        Relevant Orders    HGA1C (HEMOGLOBIN A1C WITH EST AVG GLUCOSE)    MICROALBUMIN/CREATININE RATIO, URINE, RANDOM      Hypomagnesemia        Relevant Orders    MAGNESIUM       Hyperlipidemia, unspecified hyperlipidemia type        Relevant Orders    LIPID PANEL      B12 deficiency        Relevant Orders    VITAMIN B12           Migraines well controlled.    Benefits from pain meds. Controlled substance contract renewed.  Continue hydrocodone  7.5mg  po tid for the next three months.  Continue gabapentin     BMI has increased. Having difficulty getting her samples of mounjaro and insurance does not cover  Add saxenda with titration from 1.2 to 3mg . Take daily  Sample of mounjaro 0.25 for her to take 5mg  for one month and then switch over to saxenda. She has to lose to under 230 lbs before she can be scheduled for back surgery. This is worsening her depression, and she Does have financial stressors as well.    Recommend 90 grams of protein and 25-50 grams of fiber. Cut out all extra sugars. Was going to use splenda,  but I would like her to completely remove the extra sugar if she can for optimal results    Continue metformin  which can help with insulin  resistance      No follow-ups on file.  Orders Placed This Encounter    CBC/DIFF    COMPREHENSIVE METABOLIC PANEL, NON-FASTING    LIPID PANEL    HGA1C (HEMOGLOBIN A1C WITH EST AVG GLUCOSE)    DRUG SCREEN, WITH CONFIRMATION, URINE    MICROALBUMIN/CREATININE RATIO, URINE, RANDOM    THYROID  STIMULATING HORMONE (SENSITIVE TSH)    MAGNESIUM     VITAMIN B12    VITAMIN D  25 TOTAL    CBC WITH DIFF    venlafaxine  (EFFEXOR  XR) 150 mg Oral Capsule, Sust. Release 24 hr    rosuvastatin  (CRESTOR ) 5 mg Oral Tablet    omeprazole  (PRILOSEC) 40 mg Oral Capsule, Delayed Release(E.C.)    metoclopramide  HCl (  REGLAN ) 5 mg Oral Tablet    MetFORMIN  (GLUCOPHAGE ) 1,000 mg Oral Tablet    linaCLOtide  (LINZESS ) 145 mcg Oral Capsule    gabapentin  (NEURONTIN ) 800 mg Oral Tablet    liraglutide, weight loss, 3 mg/0.5 mL (18 mg/3 mL) Subcutaneous Pen Injector    HYDROcodone -acetaminophen  (NORCO) 7.5-325 mg Oral Tablet    HYDROcodone -acetaminophen  (NORCO) 7.5-325 mg Oral Tablet    HYDROcodone -acetaminophen  (NORCO) 7.5-325 mg Oral Tablet        Discontinued Medications    HYDROCODONE -ACETAMINOPHEN  (NORCO) 7.5-325 MG ORAL TABLET    Take 1 Tablet by mouth Three times a day for 29 days      Reason for visit: Follow Up (routine)      History of Present Illness:  Lindsay Crawford is a 48 y.o. female presenting for chronic care follow up    Chronic low back pain. Middle of back and radiates mainly down right leg.  Pain is helped with gabapeinting qid and hydrocodone  7.5mg  po tid  Postponed spinal surgery due to not being able to lose weight. Has to be under 230 lbs. They are scheduling in August and September    Her insurance will not cover zepbound. She has been taking samples of mounjaro but cannot always get them.  Problem List[1]     Historical Data    Past Medical History:  Past Medical History:   Diagnosis Date    Body  mass index 35.0-35.9, adult     Borderline diabetes     Chronic low back pain     Dizziness     DUB (dysfunctional uterine bleeding)     Gastroparesis     GERD (gastroesophageal reflux disease)     Hyperglycemia     Hyperlipidemia LDL goal <70     Insomnia     Knee pain, bilateral     Migraine     Paraspinal muscle spasm     Vitamin D  deficiency          Past Surgical History:  Past Surgical History:   Procedure Laterality Date    CESAREAN SECTION  2004    COLONOSCOPY      Dr. Joesph in [place]    ENDOMETRIAL ABLATION W/ NOVASURE      dr brodnik 12/14/22    ESOPHAGOSCOPY / EGD      HX APPENDECTOMY      HX BACK SURGERY      november 2019    HX LAP CHOLECYSTECTOMY      LAPAROSCOPIC TUBAL LIGATION  07/30/2022    with D&C brodmik    NISSEN FUNDOPLICATION  08/13/2022    had in [place]         Allergies:  Allergies[2]  Medications:  albuterol  sulfate (PROVENTIL  OR VENTOLIN  OR PROAIR ) 90 mcg/actuation Inhalation oral inhaler, Take 2 Puffs by inhalation Every 6 hours as needed  bethanechol  chloride (URECHOLINE ) 10 mg Oral Tablet, Take 1 Tablet (10 mg total) by mouth Twice daily  Biotin 10 mg Oral Tablet, Take 1 Tablet (10 mg total) by mouth Daily  cetirizine  (ZYRTEC ) 10 mg Oral Tablet, Take 1 tablet by mouth once daily  Colestipol (COLESTID) 1 gram Oral Tablet, Take 2 Tablets (2 g total) by mouth Daily Takes two in am  cyanocobalamin (VITAMIN B 12) 1,000 mcg Oral Tablet, Take 1 Tablet (1,000 mcg total) by mouth Daily  cyclobenzaprine  (FLEXERIL ) 10 mg Oral Tablet, Take 1 Tablet (10 mg total) by mouth Twice daily  famotidine  (PEPCID ) 40 mg Oral Tablet, Take 1 Tablet (40  mg total) by mouth Every evening  naloxone  (NARCAN ) 4 mg per spray nasal spray, 1 Spray by INTRANASAL route Every 2 minutes as needed for actual or suspected opioid overdose. Call 911 if used.  gabapentin  (NEURONTIN ) 800 mg Oral Tablet, Take 1 Tablet (800 mg total) by mouth Four times a day  HYDROcodone -acetaminophen  (NORCO) 7.5-325 mg Oral Tablet, Take 1 Tablet  by mouth Three times a day for 29 days  linaCLOtide  (LINZESS ) 145 mcg Oral Capsule, Take 1 Capsule (145 mcg total) by mouth Every morning  MetFORMIN  (GLUCOPHAGE ) 1,000 mg Oral Tablet, Take 1 Tablet (1,000 mg total) by mouth Twice daily with food for 90 days  metoclopramide  HCl (REGLAN ) 5 mg Oral Tablet, Take 1 Tablet (5 mg total) by mouth Three times a day Patient taking twice a day  omeprazole  (PRILOSEC) 40 mg Oral Capsule, Delayed Release(E.C.), Take 1 Capsule (40 mg total) by mouth Twice daily Patient states she is taking 20 mg twice a day not 40 mg  rosuvastatin  (CRESTOR ) 5 mg Oral Tablet, Take 1 tablet by mouth once daily  venlafaxine  (EFFEXOR  XR) 150 mg Oral Capsule, Sust. Release 24 hr, Take 1 Capsule (150 mg total) by mouth Once a day    No facility-administered medications prior to visit.     Family History:  Family Medical History:       Problem Relation (Age of Onset)    COPD Father    Coronary Artery Disease Brother    Diabetes type II Father    Hypertension (High Blood Pressure) Mother, Father    Kidney Disease Father    No Known Problems Half-Sister, Half-Brother, Maternal Aunt, Maternal Uncle, Paternal Aunt, Paternal Uncle, Maternal Grandmother, Maternal Grandfather, Paternal Grandmother, Paternal Grandfather, Daughter, Son    Throat cancer Sister            Social History:  Social History     Socioeconomic History    Marital status: Widowed    Number of children: 1    Years of education: 12+    Highest education level: Associate degree: occupational, Scientist, product/process development, or vocational program   Tobacco Use    Smoking status: Never    Smokeless tobacco: Never   Vaping Use    Vaping status: Never Used   Substance and Sexual Activity    Alcohol use: Never    Drug use: Never    Sexual activity: Yes     Partners: Male     Birth control/protection: None     Social Determinants of Health     Financial Resource Strain: Low Risk  (12/29/2022)    Financial Resource Strain     SDOH Financial: No   Transportation Needs:  Low Risk  (12/29/2022)    Transportation Needs     SDOH Transportation: No   Social Connections: Low Risk  (12/29/2022)    Social Connections     SDOH Social Isolation: 5 or more times a week   Intimate Partner Violence: Low Risk  (12/29/2022)    Intimate Partner Violence     SDOH Domestic Violence: No   Housing Stability: Low Risk  (12/29/2022)    Housing Stability     SDOH Housing Situation: I have housing.     SDOH Housing Worry: No           Review of Systems:  Any pertinent Review of Systems as addressed in the HPI above.    Physical Exam:  Vital Signs:  Vitals:    05/28/24 1008   BP: 120/80  Pulse: 65   Temp: 37.2 C (98.9 F)   TempSrc: Tympanic   SpO2: 96%   Weight: 111 kg (245 lb)   Height: 1.727 m (5' 8)   BMI: 37.25     Physical Exam  Vitals reviewed.   Constitutional:       General: She is not in acute distress.     Appearance: Normal appearance.   HENT:      Head: Normocephalic and atraumatic.      Right Ear: Tympanic membrane normal. There is no impacted cerumen.      Left Ear: Tympanic membrane normal. There is no impacted cerumen.      Nose: No congestion or rhinorrhea.      Mouth/Throat:      Pharynx: No oropharyngeal exudate or posterior oropharyngeal erythema.   Eyes:      General: No scleral icterus.        Right eye: No discharge.         Left eye: No discharge.   Neck:      Vascular: No carotid bruit.   Cardiovascular:      Rate and Rhythm: Normal rate and regular rhythm.      Pulses: Normal pulses.      Heart sounds: Normal heart sounds.   Pulmonary:      Effort: Pulmonary effort is normal.      Breath sounds: Normal breath sounds.   Abdominal:      Palpations: Abdomen is soft.      Tenderness: There is no abdominal tenderness.   Musculoskeletal:      Right lower leg: No edema.      Left lower leg: No edema.   Skin:     General: Skin is warm and dry.      Capillary Refill: Capillary refill takes less than 2 seconds.      Findings: No rash.      Comments: Cut great toe left foot. No erythema, no  drainage   Neurological:      General: No focal deficit present.      Mental Status: She is alert and oriented to person, place, and time.   Psychiatric:         Mood and Affect: Mood normal.         Behavior: Behavior normal.                Jhonny Romano, DO     Portions of this note may be dictated using voice recognition software or a dictation service. Variances in spelling and vocabulary are possible and unintentional. Not all errors are caught/corrected. Please notify the dino if any discrepancies are noted or if the meaning of any statement is not clear.          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    Knee pain, right    Obesity (BMI 35.0-39.9 without comorbidity)    Borderline diabetes    Irritable bowel syndrome with constipation    DUB (dysfunctional uterine bleeding)    Gastroparesis    Hyperlipidemia LDL goal <70    Insomnia    Vitamin D  deficiency    Paraspinal muscle spasm    Migraine    GERD (gastroesophageal reflux disease)    Pelvic pain    Menorrhagia with irregular cycle    S/P Nissen fundoplication (without gastrostomy tube) procedure    Vaginal discharge    Fibroids, intramural  Menopausal symptoms    Hair loss    Irregular periods/menstrual cycles    Kidney stone    Chronic right-sided low back pain    Bilateral hand pain    Xerosis cutis    Ankle edema, bilateral    Acute left-sided thoracic back pain    Acute left flank pain   [2]   Allergies  Allergen Reactions    Other Rash     Allergic to the sun

## 2024-05-29 LAB — HGA1C (HEMOGLOBIN A1C WITH EST AVG GLUCOSE): HEMOGLOBIN A1C: 5.4 % (ref 4.0–6.0)

## 2024-05-31 LAB — OPIATES CONFIRMATORY/DEFINITIVE, URINE, BY LC-MS/MS (PERFORMABLE)
CODEINE: NOT DETECTED ng/mL (ref <25)
DIHYDROCODEINE: NOT DETECTED ng/mL (ref <25)
HYDROCODONE: NOT DETECTED ng/mL (ref <25)
HYDROMORPHONE: NOT DETECTED ng/mL (ref <25)
MORPHINE: NOT DETECTED ng/mL (ref <25)
NORHYDROCODONE: 81 ng/mL — ABNORMAL HIGH (ref <25)
NOROXYCODONE: NOT DETECTED ng/mL (ref <25)
OXYCODONE: NOT DETECTED ng/mL (ref <25)
OXYMORPHONE: NOT DETECTED ng/mL (ref <25)

## 2024-06-03 ENCOUNTER — Ambulatory Visit (RURAL_HEALTH_CENTER): Payer: Self-pay | Admitting: Family Medicine

## 2024-06-06 ENCOUNTER — Encounter (RURAL_HEALTH_CENTER): Payer: Self-pay | Admitting: Family

## 2024-06-06 ENCOUNTER — Ambulatory Visit (RURAL_HEALTH_CENTER): Payer: MEDICAID | Attending: Family Medicine | Admitting: Family

## 2024-06-06 ENCOUNTER — Other Ambulatory Visit: Payer: Self-pay

## 2024-06-06 VITALS — BP 125/76 | HR 72 | Temp 97.4°F | Resp 17 | Ht 68.0 in | Wt 243.1 lb

## 2024-06-06 DIAGNOSIS — S91112A Laceration without foreign body of left great toe without damage to nail, initial encounter: Secondary | ICD-10-CM | POA: Insufficient documentation

## 2024-06-06 DIAGNOSIS — L089 Local infection of the skin and subcutaneous tissue, unspecified: Secondary | ICD-10-CM | POA: Insufficient documentation

## 2024-06-06 DIAGNOSIS — W260XXA Contact with knife, initial encounter: Secondary | ICD-10-CM | POA: Insufficient documentation

## 2024-06-06 DIAGNOSIS — S91119A Laceration without foreign body of unspecified toe without damage to nail, initial encounter: Secondary | ICD-10-CM | POA: Insufficient documentation

## 2024-06-06 DIAGNOSIS — W208XXA Other cause of strike by thrown, projected or falling object, initial encounter: Secondary | ICD-10-CM | POA: Insufficient documentation

## 2024-06-06 MED ORDER — LIDOCAINE HCL 10 MG/ML (1 %) INJECTION SOLUTION
1.0000 g | INTRAMUSCULAR | Status: AC
Start: 2024-06-06 — End: 2024-06-06
  Administered 2024-06-06: 1 g via INTRAMUSCULAR

## 2024-06-06 NOTE — Nursing Note (Signed)
 Patient here today with complaints of left big toe being infected because she cut it when she was cutting veggies about a week ago.

## 2024-06-06 NOTE — Progress Notes (Signed)
 FAMILY MEDICINE, [facility] FAMILY MEDICINE [facility]  [address]  [place] [address]  Operated by [facility]     Name: Lindsay Crawford MRN:  [id number]   Date of Birth: 1976/10/06 Age: 48 y.o.   Date: 06/06/2024  Time: 15:13     Provider: Greig LITTIE Mills, FNP    Reason for visit: Toe Injury        History of Present Illness:   History of Present Illness  Lindsay Crawford is a 48 year old female who presents with a foot injury from a knife drop.    Approximately a week and a half ago, she accidentally dropped a serrated knife on her left great toe while chopping vegetables, resulting in significant bleeding that filled her flip-flop.    Since the injury, she has been treating the wound with peroxide, Neosporin, and a clear bandage. About three to four days ago, she noticed the development of a red line on the toe, which is her primary concern. The area remains sore, but there is no pus formation.   She is not diabetic and has no known allergies to medications.     No systemic symptoms are present.       Patient Active Problem List    Diagnosis Date Noted    Acute left-sided thoracic back pain 02/12/2024    Acute left flank pain 02/12/2024    Bilateral hand pain 10/31/2023    Xerosis cutis 10/31/2023    Ankle edema, bilateral 10/31/2023    Chronic right-sided low back pain 09/14/2023    Kidney stone 05/11/2023    Irregular periods/menstrual cycles 02/07/2023    Menopausal symptoms 12/29/2022    Hair loss 12/29/2022    Vaginal discharge 11/11/2022    Fibroids, intramural 11/11/2022    S/P Nissen fundoplication (without gastrostomy tube) procedure 08/25/2022    Pelvic pain 07/27/2022    Menorrhagia with irregular cycle 07/27/2022    DUB (dysfunctional uterine bleeding) 05/25/2022    Gastroparesis 05/25/2022    Hyperlipidemia LDL goal <70 05/25/2022    Insomnia 05/25/2022    Vitamin D  deficiency 05/25/2022    Paraspinal muscle spasm 05/25/2022    Migraine 05/25/2022    GERD (gastroesophageal  reflux disease) 05/25/2022    Knee pain, right 02/03/2022    Obesity (BMI 35.0-39.9 without comorbidity) 02/03/2022    Borderline diabetes 02/03/2022    Irritable bowel syndrome with constipation 02/03/2022    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 Joesph in [place]    ENDOMETRIAL ABLATION W/ NOVASURE      dr brodnik 12/14/22    ESOPHAGOSCOPY / EGD  HX APPENDECTOMY      HX BACK SURGERY      november 2019    HX LAP CHOLECYSTECTOMY      LAPAROSCOPIC TUBAL LIGATION  07/30/2022    with D&C brodmik    NISSEN FUNDOPLICATION  08/13/2022    had in [place]     Allergies:  Allergies[1]  Medications:  Current Outpatient Medications   Medication Sig    albuterol  sulfate (PROVENTIL  OR VENTOLIN  OR PROAIR ) 90 mcg/actuation Inhalation oral inhaler Take 2 Puffs by inhalation Every 6 hours as needed    bethanechol  chloride (URECHOLINE ) 10 mg Oral Tablet Take 1 Tablet (10 mg total) by mouth Twice daily    Biotin 10 mg Oral Tablet Take 1 Tablet (10 mg total) by mouth Daily    cetirizine  (ZYRTEC ) 10 mg Oral Tablet Take 1 tablet by mouth once daily    Colestipol (COLESTID) 1 gram Oral Tablet Take 2 Tablets (2 g total) by mouth Daily Takes two in am    cyanocobalamin (VITAMIN B 12) 1,000 mcg Oral Tablet Take 1 Tablet (1,000 mcg total) by mouth Daily    cyclobenzaprine  (FLEXERIL ) 10 mg Oral Tablet Take 1 Tablet (10 mg total) by mouth Twice daily     famotidine  (PEPCID ) 40 mg Oral Tablet Take 1 Tablet (40 mg total) by mouth Every evening    gabapentin  (NEURONTIN ) 800 mg Oral Tablet Take 1 Tablet (800 mg total) by mouth Four times a day    HYDROcodone -acetaminophen  (NORCO) 7.5-325 mg Oral Tablet Take 1 Tablet by mouth Every 8 hours as needed for Pain for up to 29 days    [START ON 06/27/2024] HYDROcodone -acetaminophen  (NORCO) 7.5-325 mg Oral Tablet Take 1 Tablet by mouth Every 8 hours as needed for Pain for up to 29 days    [START ON 07/27/2024] HYDROcodone -acetaminophen  (NORCO) 7.5-325 mg Oral Tablet Take 1 Tablet by mouth Every 8 hours as needed for Pain for up to 29 days    linaCLOtide  (LINZESS ) 145 mcg Oral Capsule Take 1 Capsule (145 mcg total) by mouth Every morning    liraglutide , weight loss, 3 mg/0.5 mL (18 mg/3 mL) Subcutaneous Pen Injector Inject 1.2 mg under the skin Daily for 7 days, THEN 1.8 mg Daily for 7 days, THEN 2.4 mg Daily for 7 days, THEN 3 mg Daily for 7 days.    MetFORMIN  (GLUCOPHAGE ) 1,000 mg Oral Tablet Take 1 Tablet (1,000 mg total) by mouth Twice daily with food for 90 days    metoclopramide  HCl (REGLAN ) 5 mg Oral Tablet Take 1 Tablet (5 mg total) by mouth Three times a day Patient taking twice a day    naloxone  (NARCAN ) 4 mg per spray nasal spray 1 Spray by INTRANASAL route Every 2 minutes as needed for actual or suspected opioid overdose. Call 911 if used.    omeprazole  (PRILOSEC) 40 mg Oral Capsule, Delayed Release(E.C.) Take 1 Capsule (40 mg total) by mouth Twice daily Patient states she is taking 20 mg twice a day not 40 mg    rosuvastatin  (CRESTOR ) 5 mg Oral Tablet Take 1 Tablet (5 mg total) by mouth Daily    venlafaxine  (EFFEXOR  XR) 150 mg Oral Capsule, Sust. Release 24 hr Take 1 Capsule (150 mg total) by mouth Daily     Family History:  Family Medical History:       Problem Relation (Age of Onset)    COPD Father    Coronary Artery Disease Brother    Diabetes type II Father    Hypertension (High  Blood Pressure) Mother, Father     Kidney Disease Father    No Known Problems Half-Sister, Half-Brother, Maternal Aunt, Maternal Uncle, Paternal Aunt, Paternal Uncle, Maternal Grandmother, Maternal Grandfather, Paternal Grandmother, Paternal Grandfather, Daughter, Son    Throat cancer Sister            Social History:  Social History     Socioeconomic History    Marital status: Widowed    Number of children: 1    Years of education: 12+    Highest education level: Associate degree: occupational, Scientist, product/process development, or vocational program   Tobacco Use    Smoking status: Never    Smokeless tobacco: Never   Vaping Use    Vaping status: Never Used   Substance and Sexual Activity    Alcohol use: Never    Drug use: Never    Sexual activity: Yes     Partners: Male     Birth control/protection: None     Social Determinants of Health     Financial Resource Strain: Low Risk  (12/29/2022)    Financial Resource Strain     SDOH Financial: No   Transportation Needs: Low Risk  (12/29/2022)    Transportation Needs     SDOH Transportation: No   Social Connections: Low Risk  (12/29/2022)    Social Connections     SDOH Social Isolation: 5 or more times a week   Intimate Partner Violence: Low Risk  (12/29/2022)    Intimate Partner Violence     SDOH Domestic Violence: No   Housing Stability: Low Risk  (12/29/2022)    Housing Stability     SDOH Housing Situation: I have housing.     SDOH Housing Worry: No           Review of Systems:  Any pertinent Review of Systems as addressed in the HPI above.    Physical Exam:  Vital Signs:  Vitals:    06/06/24 1426   BP: 125/76   Pulse: 72   Resp: 17   Temp: 36.3 C (97.4 F)   TempSrc: Temporal   SpO2: 95%   Weight: 110 kg (243 lb 2 oz)   Height: 1.727 m (5' 8)   BMI: 36.97     Physical Exam  Vitals reviewed.   Constitutional:       Appearance: Normal appearance.   HENT:      Head: Normocephalic and atraumatic.   Eyes:      General: No scleral icterus.     Conjunctiva/sclera: Conjunctivae normal.   Cardiovascular:      Rate and Rhythm: Normal  rate.   Pulmonary:      Effort: Pulmonary effort is normal.   Skin:     Coloration: Skin is not cyanotic or pale.      Findings: Laceration present.      Comments: L great toe, with healing noted.   No drainage was expressed on palpation.   There is some mild erythema, but no significant swelling.   Toe is not warm to touch.   See photo.    Neurological:      Mental Status: She is alert and oriented to person, place, and time. Mental status is at baseline.             Last A1c on 05/28/2024  5.4      ICD-10-CM    1. Cut of skin of toe of left foot  S91.119A       2. Toe inflammation  L08.9  Assessment/Plan:  Assessment & Plan  L great toe laceration with possible infection  Laceration on left toe with potential infection indicated by red line. Healing observed, but infection risk remains. Oral antibiotics considered if condition worsens.  - Administered 1g  Rocephin  via injection.  - Instructed Epsom salt soaks twice daily, continue to apply Neosporin and cover to keep clean.   - Advised monitoring pen drawn line for 24-48 hours, report if it extends.  -  Will prescribe Keflex  if redness progresses by Friday.             I reviewed the documentation of the visit provided by the certified Nurse Practitioner and agree with her medical decision making, Jhonny Romano, D.O.       Orders Placed This Encounter    cefTRIAXone  (ROCEPHIN ) 1 g in lidocaine  2.86 mL (tot vol) IM injection     There are no discontinued medications.     Return if symptoms worsen or fail to improve.    Amy L Harrup, FNP-C     This note was created with assistance from [employer] via capture of conversational audio.  Consent was obtained from the patient prior to recording.      Portions of this note may be dictated using voice recognition software or a dictation service. Variances in spelling and vocabulary are possible and unintentional. Not all errors are caught/corrected. Please notify the dino if any discrepancies are noted or if the  meaning of any statement is not clear.          [1]   Allergies  Allergen Reactions    Other Rash     Allergic to the sun

## 2024-06-07 ENCOUNTER — Other Ambulatory Visit (RURAL_HEALTH_CENTER): Payer: Self-pay | Admitting: Family Medicine

## 2024-06-25 ENCOUNTER — Other Ambulatory Visit (RURAL_HEALTH_CENTER): Payer: Self-pay | Admitting: Family Medicine

## 2024-06-28 ENCOUNTER — Telehealth (RURAL_HEALTH_CENTER): Payer: Self-pay | Admitting: Family Medicine

## 2024-06-28 NOTE — Telephone Encounter (Signed)
 Patient requesting call back regarding medication usage

## 2024-07-16 ENCOUNTER — Encounter (RURAL_HEALTH_CENTER): Payer: Self-pay

## 2024-07-16 ENCOUNTER — Ambulatory Visit (RURAL_HEALTH_CENTER): Payer: Self-pay | Admitting: Family

## 2024-07-17 ENCOUNTER — Encounter (RURAL_HEALTH_CENTER): Payer: Self-pay | Admitting: Family Medicine

## 2024-07-17 ENCOUNTER — Ambulatory Visit (RURAL_HEALTH_CENTER): Payer: MEDICAID | Attending: Family Medicine | Admitting: Family Medicine

## 2024-07-17 ENCOUNTER — Other Ambulatory Visit: Payer: Self-pay

## 2024-07-17 VITALS — BP 106/68 | HR 80 | Temp 98.5°F | Ht 68.0 in | Wt 235.0 lb

## 2024-07-17 DIAGNOSIS — M5416 Radiculopathy, lumbar region: Secondary | ICD-10-CM | POA: Insufficient documentation

## 2024-07-17 DIAGNOSIS — E669 Obesity, unspecified: Secondary | ICD-10-CM | POA: Insufficient documentation

## 2024-07-17 DIAGNOSIS — Z6835 Body mass index (BMI) 35.0-35.9, adult: Secondary | ICD-10-CM | POA: Insufficient documentation

## 2024-07-17 DIAGNOSIS — K3184 Gastroparesis: Secondary | ICD-10-CM | POA: Insufficient documentation

## 2024-07-17 MED ORDER — HYDROCODONE 10 MG-ACETAMINOPHEN 325 MG TABLET
1.0000 | ORAL_TABLET | Freq: Three times a day (TID) | ORAL | 0 refills | Status: AC | PRN
Start: 2024-07-17 — End: 2024-07-24

## 2024-07-17 MED ORDER — GABAPENTIN 800 MG TABLET
800.0000 mg | ORAL_TABLET | Freq: Four times a day (QID) | ORAL | 2 refills | Status: DC
Start: 2024-07-17 — End: 2024-08-30

## 2024-07-17 NOTE — Progress Notes (Signed)
 FAMILY MEDICINE, [facility]  [address]  [place] [address]  Operated by [facility]     Name: Lindsay Crawford MRN:  [id number]   Date of Birth: Oct 07, 1976 Age: 48 y.o.   Date: 07/17/2024  Time: 09:57     Provider: Jhonny Romano, DO    Assessment/Plan:    Problem List Items Addressed This Visit          Digestive    Gastroparesis       Other    Obesity (BMI 35.0-39.9 without comorbidity)     Other Visit Diagnoses         Lumbar back pain with radiculopathy affecting right lower extremity    -  Primary        Needs 90 grams of protein  25-50 grams of fiber per day  30 minutes of walking daily   2 days of weight lifting/ resistance training per week     Assessment & Plan  Lumbar radiculopathy with right leg pain  Chronic lumbar radiculopathy with right leg pain, managed with gabapentin  and hydrocodone -acetaminophen . Pain is stabbing and burning, 8/10 before medication and 6/10 after. Gabapentin  is at maximum dose, but pain persists. Surgery is planned contingent on weight loss to a BMI of 35.0. Discussed opioid risks, including addiction and reduced efficacy post-surgery. Effective pain relief is a 50% reduction.  - Increase hydrocodone -acetaminophen  to 10 mg three times a day for one week. Discussed she may have to wait until she has nearly finished her current prescribed dose of hydrocodone  7.5mg  before they will fill this. F/u in one week after she gets the new dose to try  - Refill gabapentin  prescription.  - Encourage weight loss to meet surgical requirements. Can try aggressive 1200 calorie diet for a week or up to six weeks if needed in order to get ready for surgery. Make sure to get needed protein.    Obesity  Obesity with BMI of 35.73. Weight loss is necessary for lumbar surgery. Current weight is 235 lbs, target is under 230 lbs. Using semaglutide  (Ozempic) for weight management. Discussed short-term liquid diet to expedite weight loss, noting  potential for weight regain post-diet.  - Continue semaglutide  (Ozempic) for weight management.  - Consider short-term liquid diet with 1200 calories and 60-90 grams of protein daily to expedite weight loss.  - Encourage physical activity, including swimming and resistance training twice a week.  - Advise stopping semaglutide  8 days before surgery to reduce aspiration risk.    Gastroparesis  Gastroparesis may be exacerbated by semaglutide  use, requiring careful management, especially with planned surgery. Semaglutide  should be discontinued 8 days prior to surgery to prevent aspiration risk.  - Monitor gastroparesis symptoms, especially in relation to semaglutide  use.  - Ensure semaglutide  is discontinued 8 days prior to surgery to prevent aspiration risk.    Recording duration: 14 minutes       No follow-ups on file.  Orders Placed This Encounter    gabapentin  (NEURONTIN ) 800 mg Oral Tablet    HYDROcodone -acetaminophen  (NORCO) 10-325 mg Oral Tablet        Discontinued Medications    HYDROCODONE -ACETAMINOPHEN  (NORCO) 7.5-325 MG ORAL TABLET    Take 1 Tablet by mouth Every 8 hours as needed for Pain for up to 29 days    HYDROCODONE -ACETAMINOPHEN  (NORCO) 7.5-325 MG ORAL TABLET    Take 1 Tablet by mouth Every 8 hours as needed for Pain for up to 29 days  HYDROCODONE -ACETAMINOPHEN  (NORCO) 7.5-325 MG ORAL TABLET    Take 1 Tablet by mouth Every 8 hours as needed for Pain for up to 29 days    LIRAGLUTIDE , WEIGHT LOSS, 3 MG/0.5 ML (18 MG/3 ML) SUBCUTANEOUS PEN INJECTOR    Inject 1.2 mg under the skin Daily for 7 days, THEN 1.8 mg Daily for 7 days, THEN 2.4 mg Daily for 7 days, THEN 3 mg Daily for 7 days.      Reason for visit: Follow Up (Follow up back pain note pain meds not helping no injury noted right leg numb at times and back puffy)      History of Present Illness:  Lindsay Crawford is a 48 y.o. female presenting for chronic care follow up    Chronic low back pain. Middle of back and radiates mainly down right  leg.  History of Present Illness  Lindsay Crawford is a 48 year old female who presents with low back pain radiating to the right leg.    She experiences stabbing and burning pain on the right side of her back that radiates down her right leg, with an intensity of 8 out of 10 before taking medication, reducing to 5 or 6 after medication. The pain has become sharper and more difficult to manage over time.    She is currently taking gabapentin , which is not fully alleviating her symptoms. She has previously tried Lyrica  but is currently on gabapentin . She has been prescribed hydrocodone , which provides some relief but she is concerned about its effectiveness over time.    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 She expresses frustration with the delay in scheduling her surgery, which her surgeon told her would be performed at the L4, L5, and S1 levels as a fusion procedure.    Her current weight is 235 pounds, and she has been working on weight loss, having lost 8 pounds since June 06, 2024. She is currently using Ozempic (semaglutide ) at a 2 mg dose, which she is unsure is helping with weight loss.    She has a history of gastroparesis, which was a concern when starting The GLP1a.     Problem List[1]     Historical Data    Past Medical History:  Past Medical History:   Diagnosis Date    Body mass index 35.0-35.9, adult     Borderline diabetes     Chronic low back pain     Dizziness     DUB (dysfunctional uterine bleeding)     Gastroparesis     GERD (gastroesophageal reflux disease)     Hyperglycemia     Hyperlipidemia LDL goal <70     Insomnia     Knee pain, bilateral     Migraine     Paraspinal muscle spasm     Vitamin D  deficiency          Past Surgical History:  Past Surgical History:   Procedure Laterality Date    CESAREAN SECTION  2004    COLONOSCOPY      Dr. Joesph in [place]    ENDOMETRIAL ABLATION W/ NOVASURE      dr brodnik 12/14/22     ESOPHAGOSCOPY / EGD      HX APPENDECTOMY      HX BACK SURGERY      november 2019    HX LAP CHOLECYSTECTOMY      LAPAROSCOPIC TUBAL LIGATION  07/30/2022    with [facility]&C brodmik    NISSEN FUNDOPLICATION  08/13/2022    had in [place]         Allergies:  Allergies[2]  Medications:  albuterol  sulfate (PROVENTIL  OR VENTOLIN  OR PROAIR ) 90 mcg/actuation Inhalation oral inhaler, Take 2 Puffs by inhalation Every 6 hours as needed  bethanechol  chloride (URECHOLINE ) 10 mg Oral Tablet, Take 1 tablet by mouth twice daily  Biotin 10 mg Oral Tablet, Take 1 Tablet (10 mg total) by mouth Daily  cetirizine  (ZYRTEC ) 10 mg Oral Tablet, Take 1 tablet by mouth once daily  Colestipol (COLESTID) 1 gram Oral Tablet, Take 2 Tablets (2 g total) by mouth Daily Takes two in am  cyanocobalamin (VITAMIN B 12) 1,000 mcg Oral Tablet, Take 1 Tablet (1,000 mcg total) by mouth Daily (Patient not taking: Reported on 07/17/2024)  cyclobenzaprine  (FLEXERIL ) 10 mg Oral Tablet, Take 1 tablet by mouth twice daily  linaCLOtide  (LINZESS ) 145 mcg Oral Capsule, Take 1 Capsule (145 mcg total) by mouth Every morning  MetFORMIN  (GLUCOPHAGE ) 1,000 mg Oral Tablet, Take 1 Tablet (1,000 mg total) by mouth Twice daily with food for 90 days  metoclopramide  HCl (REGLAN ) 5 mg Oral Tablet, Take 1 Tablet (5 mg total) by mouth Three times a day Patient taking twice a day (Patient not taking: Reported on 07/17/2024)  naloxone  (NARCAN ) 4 mg per spray nasal spray, 1 Spray by INTRANASAL route Every 2 minutes as needed for actual or suspected opioid overdose. Call 911 if used.  omeprazole  (PRILOSEC) 40 mg Oral Capsule, Delayed Release(E.C.), Take 1 Capsule (40 mg total) by mouth Twice daily Patient states she is taking 20 mg twice a day not 40 mg  QUEtiapine  (SEROQUEL ) 100 mg Oral Tablet, Take 1 tablet by mouth twice daily  rosuvastatin  (CRESTOR ) 5 mg Oral Tablet, Take 1 Tablet (5 mg total) by mouth Daily  semaglutide  (OZEMPIC) 2 mg/dose (8 mg/3 mL) Subcutaneous Pen Injector, Inject 2 mg  under the skin Every 7 days  venlafaxine  (EFFEXOR  XR) 150 mg Oral Capsule, Sust. Release 24 hr, Take 1 Capsule (150 mg total) by mouth Daily  gabapentin  (NEURONTIN ) 800 mg Oral Tablet, Take 1 Tablet (800 mg total) by mouth Four times a day  HYDROcodone -acetaminophen  (NORCO) 7.5-325 mg Oral Tablet, Take 1 Tablet by mouth Every 8 hours as needed for Pain for up to 29 days  HYDROcodone -acetaminophen  (NORCO) 7.5-325 mg Oral Tablet, Take 1 Tablet by mouth Every 8 hours as needed for Pain for up to 29 days  [START ON 07/27/2024] HYDROcodone -acetaminophen  (NORCO) 7.5-325 mg Oral Tablet, Take 1 Tablet by mouth Every 8 hours as needed for Pain for up to 29 days  liraglutide , weight loss, 3 mg/0.5 mL (18 mg/3 mL) Subcutaneous Pen Injector, Inject 1.2 mg under the skin Daily for 7 days, THEN 1.8 mg Daily for 7 days, THEN 2.4 mg Daily for 7 days, THEN 3 mg Daily for 7 days. (Patient not taking: No sig reported)    No facility-administered medications prior to visit.     Family History:  Family Medical History:       Problem Relation (Age of Onset)    COPD Father    Coronary Artery Disease Brother    Diabetes type II Father    Hypertension (High Blood Pressure) Mother, Father    Kidney Disease Father    No Known Problems Half-Sister, Half-Brother, Maternal Aunt, Maternal Uncle, Paternal Aunt, Paternal Uncle, Maternal Grandmother, Maternal Grandfather, Paternal Grandmother, Paternal Grandfather, Daughter, Son    Throat cancer  Sister            Social History:  Social History     Socioeconomic History    Marital status: Widowed    Number of children: 1    Years of education: 12+    Highest education level: Associate degree: occupational, Scientist, product/process development, or vocational program   Tobacco Use    Smoking status: Never    Smokeless tobacco: Never   Vaping Use    Vaping status: Never Used   Substance and Sexual Activity    Alcohol use: Never    Drug use: Never    Sexual activity: Yes     Partners: Male     Birth control/protection: None      Social Determinants of Health     Financial Resource Strain: Low Risk  (12/29/2022)    Financial Resource Strain     SDOH Financial: No   Transportation Needs: Low Risk  (12/29/2022)    Transportation Needs     SDOH Transportation: No   Social Connections: Low Risk  (12/29/2022)    Social Connections     SDOH Social Isolation: 5 or more times a week   Intimate Partner Violence: Low Risk  (12/29/2022)    Intimate Partner Violence     SDOH Domestic Violence: No   Housing Stability: Low Risk  (12/29/2022)    Housing Stability     SDOH Housing Situation: I have housing.     SDOH Housing Worry: No           Review of Systems:  Any pertinent Review of Systems as addressed in the HPI above.    Physical Exam:  Vital Signs:  Vitals:    07/17/24 0911   BP: 106/68   Pulse: 80   Temp: 36.9 C (98.5 F)   TempSrc: Tympanic   SpO2: 97%   Weight: 107 kg (235 lb)   Height: 1.727 m (5' 8)   BMI: 35.73     Physical Exam  Vitals reviewed.   Constitutional:       General: She is not in acute distress.     Appearance: Normal appearance.   HENT:      Head: Normocephalic and atraumatic.      Right Ear: Tympanic membrane normal. There is no impacted cerumen.      Left Ear: Tympanic membrane normal. There is no impacted cerumen.      Nose: No congestion or rhinorrhea.      Mouth/Throat:      Pharynx: No oropharyngeal exudate or posterior oropharyngeal erythema.   Eyes:      General: No scleral icterus.        Right eye: No discharge.         Left eye: No discharge.   Neck:      Vascular: No carotid bruit.   Cardiovascular:      Rate and Rhythm: Normal rate and regular rhythm.      Pulses: Normal pulses.      Heart sounds: Normal heart sounds.   Pulmonary:      Effort: Pulmonary effort is normal.      Breath sounds: Normal breath sounds.   Abdominal:      Palpations: Abdomen is soft.      Tenderness: There is no abdominal tenderness.   Musculoskeletal:      Right lower leg: No edema.      Left lower leg: No edema.   Skin:     General: Skin  is warm and  dry.      Capillary Refill: Capillary refill takes less than 2 seconds.      Findings: No rash.   Neurological:      General: No focal deficit present.      Mental Status: She is alert and oriented to person, place, and time.   Psychiatric:         Mood and Affect: Mood normal.         Behavior: Behavior normal.                Jhonny Romano, DO     Portions of this note may be dictated using voice recognition software or a dictation service. Variances in spelling and vocabulary are possible and unintentional. Not all errors are caught/corrected. Please notify the dino if any discrepancies are noted or if the meaning of any statement is not clear.            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    Knee pain, right    Obesity (BMI 35.0-39.9 without comorbidity)    Borderline diabetes    Irritable bowel syndrome with constipation    DUB (dysfunctional uterine bleeding)    Gastroparesis    Hyperlipidemia LDL goal <70    Insomnia    Vitamin D  deficiency    Paraspinal muscle spasm    Migraine    GERD (gastroesophageal reflux disease)    Pelvic pain    Menorrhagia with irregular cycle    S/P Nissen fundoplication (without gastrostomy tube) procedure    Vaginal discharge    Fibroids, intramural    Menopausal symptoms    Hair loss    Irregular periods/menstrual cycles    Kidney stone    Chronic right-sided low back pain    Bilateral hand pain    Xerosis cutis    Ankle edema, bilateral    Acute left-sided thoracic back pain    Acute left flank pain   [2]   Allergies  Allergen Reactions    Other Rash     Allergic to the sun

## 2024-07-26 ENCOUNTER — Other Ambulatory Visit (RURAL_HEALTH_CENTER): Payer: Self-pay | Admitting: Family Medicine

## 2024-08-29 ENCOUNTER — Ambulatory Visit (RURAL_HEALTH_CENTER): Payer: Self-pay | Admitting: Family

## 2024-08-30 ENCOUNTER — Other Ambulatory Visit: Payer: Self-pay

## 2024-08-30 ENCOUNTER — Ambulatory Visit: Payer: MEDICAID | Attending: Family Medicine | Admitting: Family Medicine

## 2024-08-30 ENCOUNTER — Encounter (RURAL_HEALTH_CENTER): Payer: Self-pay | Admitting: Family Medicine

## 2024-08-30 ENCOUNTER — Ambulatory Visit (RURAL_HEALTH_CENTER): Payer: MEDICAID | Attending: Family Medicine | Admitting: Family Medicine

## 2024-08-30 VITALS — BP 138/80 | HR 88 | Temp 98.9°F | Ht 68.0 in | Wt 230.0 lb

## 2024-08-30 DIAGNOSIS — I10 Essential (primary) hypertension: Secondary | ICD-10-CM | POA: Insufficient documentation

## 2024-08-30 DIAGNOSIS — K760 Fatty (change of) liver, not elsewhere classified: Secondary | ICD-10-CM | POA: Insufficient documentation

## 2024-08-30 DIAGNOSIS — M545 Low back pain, unspecified: Secondary | ICD-10-CM | POA: Insufficient documentation

## 2024-08-30 DIAGNOSIS — K581 Irritable bowel syndrome with constipation: Secondary | ICD-10-CM | POA: Insufficient documentation

## 2024-08-30 DIAGNOSIS — Z79899 Other long term (current) drug therapy: Secondary | ICD-10-CM | POA: Insufficient documentation

## 2024-08-30 DIAGNOSIS — Z1159 Encounter for screening for other viral diseases: Secondary | ICD-10-CM | POA: Insufficient documentation

## 2024-08-30 DIAGNOSIS — Z114 Encounter for screening for human immunodeficiency virus [HIV]: Secondary | ICD-10-CM | POA: Insufficient documentation

## 2024-08-30 DIAGNOSIS — Z6834 Body mass index (BMI) 34.0-34.9, adult: Secondary | ICD-10-CM | POA: Insufficient documentation

## 2024-08-30 DIAGNOSIS — M5416 Radiculopathy, lumbar region: Secondary | ICD-10-CM | POA: Insufficient documentation

## 2024-08-30 DIAGNOSIS — E785 Hyperlipidemia, unspecified: Secondary | ICD-10-CM | POA: Insufficient documentation

## 2024-08-30 DIAGNOSIS — E66811 Obesity, class 1: Secondary | ICD-10-CM | POA: Insufficient documentation

## 2024-08-30 DIAGNOSIS — K3184 Gastroparesis: Secondary | ICD-10-CM | POA: Insufficient documentation

## 2024-08-30 DIAGNOSIS — Z5181 Encounter for therapeutic drug level monitoring: Secondary | ICD-10-CM | POA: Insufficient documentation

## 2024-08-30 DIAGNOSIS — G4700 Insomnia, unspecified: Secondary | ICD-10-CM | POA: Insufficient documentation

## 2024-08-30 DIAGNOSIS — K219 Gastro-esophageal reflux disease without esophagitis: Secondary | ICD-10-CM | POA: Insufficient documentation

## 2024-08-30 DIAGNOSIS — F321 Major depressive disorder, single episode, moderate: Secondary | ICD-10-CM | POA: Insufficient documentation

## 2024-08-30 LAB — DRUG SCREEN, WITH CONFIRMATION, URINE
AMPHETAMINES URINE: NEGATIVE
BARBITURATES URINE: NEGATIVE
BENZODIAZEPINES URINE: NEGATIVE
BUPRENORPHINE URINE: NEGATIVE
CANNABINOIDS URINE: NEGATIVE
COCAINE METABOLITES URINE: NEGATIVE
FENTANYL, URINE: NEGATIVE
METHADONE URINE: NEGATIVE
OPIATES URINE: POSITIVE — AB
OXYCODONE URINE: NEGATIVE
PCP URINE: NEGATIVE

## 2024-08-30 LAB — CBC WITH DIFF
BASOPHIL #: 0 x10ˆ3/uL (ref 0.00–0.10)
BASOPHIL %: 1 % (ref 0–1)
EOSINOPHIL #: 0.2 x10ˆ3/uL (ref 0.00–0.50)
EOSINOPHIL %: 3 % (ref 1–7)
HCT: 39.4 % (ref 31.2–41.9)
HGB: 13.9 g/dL (ref 10.9–14.3)
LYMPHOCYTE #: 2 x10ˆ3/uL (ref 1.10–3.10)
LYMPHOCYTE %: 26 % (ref 16–46)
MCH: 30 pg (ref 24.7–32.8)
MCHC: 35.3 g/dL (ref 32.3–35.6)
MCV: 85 fL (ref 75.5–95.3)
MONOCYTE #: 0.4 x10ˆ3/uL (ref 0.20–0.90)
MONOCYTE %: 6 % (ref 4–11)
MPV: 8.6 fL (ref 7.9–10.8)
NEUTROPHIL #: 4.9 x10ˆ3/uL (ref 1.90–8.20)
NEUTROPHIL %: 65 % (ref 43–77)
PLATELETS: 234 x10ˆ3/uL (ref 140–440)
RBC: 4.64 x10ˆ6/uL (ref 3.63–4.92)
RDW: 13 % (ref 12.3–17.7)
WBC: 7.6 x10ˆ3/uL (ref 3.8–11.8)

## 2024-08-30 LAB — COMPREHENSIVE METABOLIC PANEL, NON-FASTING
ALBUMIN/GLOBULIN RATIO: 1.7 — ABNORMAL HIGH (ref 0.8–1.4)
ALBUMIN: 4.7 g/dL (ref 3.5–5.7)
ALKALINE PHOSPHATASE: 71 U/L (ref 34–104)
ALT (SGPT): 33 U/L (ref 7–52)
ANION GAP: 9 mmol/L (ref 4–13)
AST (SGOT): 29 U/L (ref 13–39)
BILIRUBIN TOTAL: 0.5 mg/dL (ref 0.3–1.0)
BUN/CREA RATIO: 15 (ref 6–22)
BUN: 12 mg/dL (ref 7–25)
CALCIUM, CORRECTED: 9.3 mg/dL (ref 8.9–10.8)
CALCIUM: 9.9 mg/dL (ref 8.6–10.3)
CHLORIDE: 104 mmol/L (ref 98–107)
CO2 TOTAL: 26 mmol/L (ref 21–31)
CREATININE: 0.79 mg/dL (ref 0.60–1.30)
ESTIMATED GFR: 93 mL/min/1.73mˆ2 (ref >59)
GLOBULIN: 2.7 (ref 2.0–3.5)
GLUCOSE: 92 mg/dL (ref 74–109)
OSMOLALITY, CALCULATED: 277 mosm/kg (ref 270–290)
POTASSIUM: 4.4 mmol/L (ref 3.5–5.1)
PROTEIN TOTAL: 7.4 g/dL (ref 6.4–8.9)
SODIUM: 139 mmol/L (ref 136–145)

## 2024-08-30 LAB — LIPID PANEL
CHOL/HDL RATIO: 3.2
CHOLESTEROL: 147 mg/dL (ref <200)
HDL CHOL: 46 mg/dL (ref >=40)
LDL CALC: 62 mg/dL (ref 0–100)
TRIGLYCERIDES: 196 mg/dL — ABNORMAL HIGH (ref <=150)
VLDL CALC: 39 mg/dL (ref 0–50)

## 2024-08-30 LAB — HIV 1 AND 2 RAPID SCREEN
HIV-1/2 ANTIBODY SCREEN: NONREACTIVE
HIV1-p24 ANTIGEN SCREEN: NONREACTIVE

## 2024-08-30 MED ORDER — ROSUVASTATIN 5 MG TABLET: 5.0000 mg | ORAL_TABLET | Freq: Every day | ORAL | 0 refills | Status: DC

## 2024-08-30 MED ORDER — GABAPENTIN 800 MG TABLET: 800.0000 mg | ORAL_TABLET | Freq: Four times a day (QID) | ORAL | 5 refills | Status: DC

## 2024-08-30 MED ORDER — LINACLOTIDE 290 MCG CAPSULE: 290.0000 ug | ORAL_CAPSULE | Freq: Every morning | ORAL | 3 refills | Status: DC

## 2024-08-30 MED ORDER — NALOXONE 4 MG/ACTUATION NASAL SPRAY
1.0000 | NASAL | 1 refills | Status: AC | PRN
Start: 2024-08-30 — End: ?

## 2024-08-30 MED ORDER — CETIRIZINE 10 MG TABLET: 10.0000 mg | ORAL_TABLET | Freq: Every day | ORAL | 0 refills | Status: DC

## 2024-08-30 MED ORDER — OMEPRAZOLE 40 MG CAPSULE,DELAYED RELEASE
40.0000 mg | DELAYED_RELEASE_CAPSULE | Freq: Two times a day (BID) | ORAL | 3 refills | Status: DC
Start: 2024-08-30 — End: 2024-09-03

## 2024-08-30 MED ORDER — VENLAFAXINE ER 150 MG CAPSULE,EXTENDED RELEASE 24 HR: 150.0000 mg | ORAL_CAPSULE | Freq: Every day | ORAL | 3 refills | Status: DC

## 2024-08-30 MED ORDER — QUETIAPINE 100 MG TABLET: 100.0000 mg | ORAL_TABLET | Freq: Two times a day (BID) | ORAL | 0 refills | Status: DC

## 2024-08-30 MED ORDER — HYDROCODONE 10 MG-ACETAMINOPHEN 325 MG TABLET
1.0000 | ORAL_TABLET | Freq: Three times a day (TID) | ORAL | 0 refills | Status: AC | PRN
Start: 2024-10-29 — End: 2024-11-27

## 2024-08-30 MED ORDER — METFORMIN 1,000 MG TABLET: 1000.0000 mg | ORAL_TABLET | Freq: Two times a day (BID) | ORAL | 3 refills | Status: DC

## 2024-08-30 MED ORDER — HYDROCODONE 10 MG-ACETAMINOPHEN 325 MG TABLET
1.0000 | ORAL_TABLET | Freq: Three times a day (TID) | ORAL | 0 refills | Status: AC | PRN
Start: 2024-08-30 — End: 2024-09-28

## 2024-08-30 MED ORDER — HYDROCODONE 10 MG-ACETAMINOPHEN 325 MG TABLET
1.0000 | ORAL_TABLET | Freq: Three times a day (TID) | ORAL | 0 refills | Status: AC | PRN
Start: 2024-09-29 — End: 2024-10-28

## 2024-08-30 NOTE — Progress Notes (Signed)
 FAMILY MEDICINE, [facility] FAMILY MEDICINE [facility]  [address]  [place] [address]  Operated by [facility]     Name: Lindsay Crawford MRN:  [id number]   Date of Birth: [birth year] Age: 48 y.o.   Date: 08/30/2024  Time: 20:42     Provider: Jhonny Romano, DO    Assessment/Plan:    Problem List Items Addressed This Visit          Neurologic    Insomnia       Digestive    Gastroparesis    GERD (gastroesophageal reflux disease)    Relevant Medications    omeprazole  (PRILOSEC) 40 mg Oral Capsule, Delayed Release(E.C.)    Irritable bowel syndrome with constipation       Musculoskeletal    Lumbar back pain with radiculopathy affecting right lower extremity - Primary     Other Visit Diagnoses         Need for hepatitis C screening test        Relevant Orders    HEPATITIS C ANTIBODY SCREEN WITH REFLEX TO HCV PCR      Hypertension, unspecified type        Relevant Orders    CBC/DIFF (Completed)    COMPREHENSIVE METABOLIC PANEL, NON-FASTING      Encounter for screening for HIV        Relevant Orders    HIV 1 AND 2 RAPID SCREEN (Completed)      Fatty liver disease, nonalcoholic        Relevant Orders    LIVER FIBROSIS, FIBROTEST PNL      Therapeutic drug monitoring        Relevant Orders    DRUG SCREEN, WITH CONFIRMATION, URINE (Completed)    OPIATES CONFIRMATORY/DEFINITIVE, URINE, BY LC-MS/MS (PERFORMABLE)      Hyperlipidemia, unspecified hyperlipidemia type        Relevant Medications    rosuvastatin  (CRESTOR ) 5 mg Oral Tablet    Other Relevant Orders    LIPID PANEL (Completed)      Obesity, Class I, BMI 30-34.9          Moderate major depression (CMS HCC)        Relevant Medications    venlafaxine  (EFFEXOR  XR) 150 mg Oral Capsule, Sust. Release 24 hr    QUEtiapine  (SEROQUEL ) 100 mg Oral Tablet    gabapentin  (NEURONTIN ) 800 mg Oral Tablet              Assessment & Plan    Lumbar radiculopathy with low back pain  Chronic low back pain with radiculopathy primarily affecting the right  lower extremity, with occasional involvement of the left side. Pain is rated 9/10 before medication and 6-7/10 after medication. Scheduled for L4-L5 fusion surgery in December, expected to last six hours with an anticipated 80% improvement in symptoms per the pt. Previous back surgery was beneficial before new injury.  - Continue gabapentin  800 mg four times a day for neuropathic pain.  - Continue hydrocodone -acetaminophen  10mg -325 mg every 8 hours as needed for pain.  - Hold pain medication provided by this office if surgery occurs before next visit and use what is provided by the surgical team.  - Stop Ozempic 8 days prior to surgery to reduce risk of aspiration.    Obesity  BMI is now less than 35. She is on semaglutide  (Ozempic) 2 mg weekly for weight management, which is contributing to weight loss. Weight loss is beneficial for managing fatty liver disease and overall  health.  - Continue semaglutide  2 mg weekly for weight management. Her insurance would not cover this and she is using someone else's semaglutide  as she has to maintain weight loss <35 to proceed with surgery  - Encourage further weight loss as it significantly impacts fatty liver disease.    Fatty liver disease  Fatty liver disease managed primarily through weight loss.  - Encourage continued weight loss as it is the most effective intervention for fatty liver disease.    Gastroparesis and gastroesophageal reflux disease (GERD)  GERD symptoms are well-controlled, though there is some belching after meals, potentially exacerbated by semaglutide .    - Continue current management for GERD.    Chronic constipation  Current regimen with linaclotide  is not fully effective. She reports incomplete relief from constipation.  - Adjust linaclotide  dosage to 290mcg daily to  improve bowel movements. Make sure to take it on empty stomach    Depression and insomnia  She is on venlafaxine  for depression and quetiapine  for insomnia. Quetiapine  may contribute to  weight gain. She is advised to try reducing quetiapine  dose to 100 mg at night to aid weight loss, if possible.  - Continue venlafaxine  150 mg daily.  - Attempt to reduce quetiapine  to 100 mg at night to assess impact on sleep and weight.    General Health Maintenance  She declined further COVID-19 vaccinations. Discussed once-in-a-lifetime screening for hepatitis C and HIV. Last Pap smear was with Dr. Adaline, and she has had two ablations but no hysterectomy.  - Perform hepatitis C and HIV screening with next blood work.           No follow-ups on file.  Orders Placed This Encounter    HEPATITIS C ANTIBODY SCREEN WITH REFLEX TO HCV PCR    HIV 1 AND 2 RAPID SCREEN    CBC/DIFF    COMPREHENSIVE METABOLIC PANEL, NON-FASTING    LIPID PANEL    LIVER FIBROSIS, FIBROTEST PNL    DRUG SCREEN, WITH CONFIRMATION, URINE    CBC WITH DIFF    OPIATES CONFIRMATORY/DEFINITIVE, URINE, BY LC-MS/MS (PERFORMABLE)    venlafaxine  (EFFEXOR  XR) 150 mg Oral Capsule, Sust. Release 24 hr    rosuvastatin  (CRESTOR ) 5 mg Oral Tablet    QUEtiapine  (SEROQUEL ) 100 mg Oral Tablet    omeprazole  (PRILOSEC) 40 mg Oral Capsule, Delayed Release(E.C.)    naloxone  (NARCAN ) 4 mg per spray nasal spray    MetFORMIN  (GLUCOPHAGE ) 1,000 mg Oral Tablet    linaCLOtide  (LINZESS ) 290 mcg Oral Capsule    gabapentin  (NEURONTIN ) 800 mg Oral Tablet    cetirizine  (ZYRTEC ) 10 mg Oral Tablet    HYDROcodone -acetaminophen  (NORCO) 10-325 mg Oral Tablet    HYDROcodone -acetaminophen  (NORCO) 10-325 mg Oral Tablet    HYDROcodone -acetaminophen  (NORCO) 10-325 mg Oral Tablet        Discontinued Medications    CYANOCOBALAMIN (VITAMIN B 12) 1,000 MCG ORAL TABLET    Take 1 Tablet (1,000 mcg total) by mouth Daily    HYDROCODONE -ACETAMINOPHEN  (NORCO) 10-325 MG ORAL TABLET    Take 1 Tablet by mouth Every 8 hours as needed for Pain    LINACLOTIDE  (LINZESS ) 145 MCG ORAL CAPSULE    Take 1 Capsule (145 mcg total) by mouth Every morning    METOCLOPRAMIDE  HCL (REGLAN ) 5 MG ORAL TABLET    Take  1 Tablet (5 mg total) by mouth Three times a day Patient taking twice a day      Reason for visit: Follow Up (routine)  History of Present Illness:  History of Present Illness    Lindsay Crawford is a 48 year old female with chronic low back pain who presents for management of her pain medications.    She is scheduled for back surgery in December, which has been delayed multiple times due to scheduling conflicts with the surgical team. She experiences chronic low back pain rated at 9/10 before medication and 6-7/10 after medication. The pain radiates down the right side and occasionally the left side. She has a history of a herniated disc and previous back surgery that was beneficial.    She is currently taking gabapentin  800 mg four times a day and hydrocodone  for pain management. Gabapentin  is taken qid which also helps with the burning pain in her leg. Physical therapy did not alleviate her pain and sometimes exacerbated it.    She has a history of fatty liver disease diagnosed through a liver scan and is under the care of a gastroenterologist. She is not currently taking B12 regularly. She uses Ozempic for weight loss, which causes belching and dyspepsia. She has experienced some weight loss, which is beneficial for her liver condition.    Effexor  is taken for anxiety or depression, and Seroquel  200 mg at night for sleep. Constipation persists despite using Linzess  145mcg daily. 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      Problem List[1]     Historical Data    Past Medical History:  Past Medical History:   Diagnosis Date    Body mass index 35.0-35.9, adult     Borderline diabetes     Chronic low back pain     Dizziness     DUB (dysfunctional uterine bleeding)     Gastroparesis     GERD (gastroesophageal reflux disease)     Hyperglycemia     Hyperlipidemia LDL goal <70     Insomnia     Knee pain, bilateral     Migraine     Paraspinal muscle spasm     Vitamin D  deficiency          Past  Surgical History:  Past Surgical History:   Procedure Laterality Date    CESAREAN SECTION  2004    COLONOSCOPY      Dr. Joesph in [place]    ENDOMETRIAL ABLATION W/ NOVASURE      dr brodnik 12/14/22    ESOPHAGOSCOPY / EGD      HX APPENDECTOMY      HX BACK SURGERY      november 2019    HX LAP CHOLECYSTECTOMY      LAPAROSCOPIC TUBAL LIGATION  07/30/2022    with D&C brodmik    NISSEN FUNDOPLICATION  08/13/2022    had in [place]         Allergies:  Allergies[2]  Medications:  albuterol  sulfate (PROVENTIL  OR VENTOLIN  OR PROAIR ) 90 mcg/actuation Inhalation oral inhaler, Take 2 Puffs by inhalation Every 6 hours as needed  bethanechol  chloride (URECHOLINE ) 10 mg Oral Tablet, Take 1 tablet by mouth twice daily  Biotin 10 mg Oral Tablet, Take 1 Tablet (10 mg total) by mouth Daily  Colestipol (COLESTID) 1 gram Oral Tablet, Take 2 Tablets (2 g total) by mouth Daily Takes two in am  cyclobenzaprine  (FLEXERIL ) 10 mg Oral Tablet, Take 1 tablet by mouth twice daily  semaglutide  (OZEMPIC) 2 mg/dose (8 mg/3 mL) Subcutaneous Pen Injector, Inject 2 mg under the skin Every 7 days  cetirizine  (ZYRTEC ) 10 mg Oral  Tablet, Take 1 tablet by mouth once daily  cyanocobalamin (VITAMIN B 12) 1,000 mcg Oral Tablet, Take 1 Tablet (1,000 mcg total) by mouth Daily (Patient not taking: Reported on 07/17/2024)  gabapentin  (NEURONTIN ) 800 mg Oral Tablet, Take 1 Tablet (800 mg total) by mouth Four times a day  HYDROcodone -acetaminophen  (NORCO) 10-325 mg Oral Tablet, Take 1 Tablet by mouth Every 8 hours as needed for Pain  linaCLOtide  (LINZESS ) 145 mcg Oral Capsule, Take 1 Capsule (145 mcg total) by mouth Every morning  MetFORMIN  (GLUCOPHAGE ) 1,000 mg Oral Tablet, Take 1 Tablet (1,000 mg total) by mouth Twice daily with food for 90 days  metoclopramide  HCl (REGLAN ) 5 mg Oral Tablet, Take 1 Tablet (5 mg total) by mouth Three times a day Patient taking twice a day (Patient not taking: Reported on 07/17/2024)  naloxone  (NARCAN ) 4 mg per spray nasal spray, 1 Spray  by INTRANASAL route Every 2 minutes as needed for actual or suspected opioid overdose. Call 911 if used.  omeprazole  (PRILOSEC) 40 mg Oral Capsule, Delayed Release(E.C.), Take 1 Capsule (40 mg total) by mouth Twice daily Patient states she is taking 20 mg twice a day not 40 mg  QUEtiapine  (SEROQUEL ) 100 mg Oral Tablet, Take 1 tablet by mouth twice daily  rosuvastatin  (CRESTOR ) 5 mg Oral Tablet, Take 1 Tablet (5 mg total) by mouth Daily  venlafaxine  (EFFEXOR  XR) 150 mg Oral Capsule, Sust. Release 24 hr, Take 1 Capsule (150 mg total) by mouth Daily    No facility-administered medications prior to visit.     Family History:  Family Medical History:       Problem Relation (Age of Onset)    COPD Father    Coronary Artery Disease Brother    Diabetes type II Father    Hypertension (High Blood Pressure) Mother, Father    Kidney Disease Father    No Known Problems Half-Sister, Half-Brother, Maternal Aunt, Maternal Uncle, Paternal Aunt, Paternal Uncle, Maternal Grandmother, Maternal Grandfather, Paternal Grandmother, Paternal Grandfather, Daughter, Son    Throat cancer Sister            Social History:  Social History     Socioeconomic History    Marital status: Widowed    Number of children: 1    Years of education: 12+    Highest education level: Associate degree: occupational, Scientist, product/process development, or vocational program   Tobacco Use    Smoking status: Never    Smokeless tobacco: Never   Vaping Use    Vaping status: Never Used   Substance and Sexual Activity    Alcohol use: Never    Drug use: Never    Sexual activity: Yes     Partners: Male     Birth control/protection: None     Social Determinants of Health     Financial Resource Strain: Low Risk (12/29/2022)    Financial Resource Strain     SDOH Financial: No   Transportation Needs: Low Risk (12/29/2022)    Transportation Needs     SDOH Transportation: No   Social Connections: Low Risk (12/29/2022)    Social Connections     SDOH Social Isolation: 5 or more times a week   Intimate  Partner Violence: Low Risk (12/29/2022)    Intimate Partner Violence     SDOH Domestic Violence: No   Housing Stability: Low Risk (12/29/2022)    Housing Stability     SDOH Housing Situation: I have housing.     SDOH Housing Worry: No  Review of Systems:  Any pertinent Review of Systems as addressed in the HPI above.    Physical Exam:  Vital Signs:  Vitals:    08/30/24 0930   BP: 138/80   Pulse: 88   Temp: 37.2 C (98.9 F)   TempSrc: Tympanic   SpO2: 98%   Weight: 104 kg (230 lb)   Height: 1.727 m (5' 8)   BMI: 34.97     Physical Exam  Vitals reviewed.   Constitutional:       General: She is not in acute distress.     Appearance: Normal appearance.   HENT:      Head: Normocephalic and atraumatic.      Right Ear: Tympanic membrane normal. There is no impacted cerumen.      Left Ear: Tympanic membrane normal. There is no impacted cerumen.      Nose: No congestion or rhinorrhea.      Mouth/Throat:      Pharynx: No oropharyngeal exudate or posterior oropharyngeal erythema.   Eyes:      General: No scleral icterus.        Right eye: No discharge.         Left eye: No discharge.   Neck:      Vascular: No carotid bruit.   Cardiovascular:      Rate and Rhythm: Normal rate and regular rhythm.      Pulses: Normal pulses.      Heart sounds: Normal heart sounds.   Pulmonary:      Effort: Pulmonary effort is normal.      Breath sounds: Normal breath sounds.   Abdominal:      Palpations: Abdomen is soft.      Tenderness: There is no abdominal tenderness.   Musculoskeletal:      Right lower leg: No edema.      Left lower leg: No edema.   Skin:     General: Skin is warm and dry.      Capillary Refill: Capillary refill takes less than 2 seconds.      Findings: No rash.   Neurological:      General: No focal deficit present.      Mental Status: She is alert and oriented to person, place, and time.   Psychiatric:         Mood and Affect: Mood normal.         Behavior: Behavior normal.         Jhonny Romano, DO     Portions  of this note may be dictated using voice recognition software or a dictation service. Variances in spelling and vocabulary are possible and unintentional. Not all errors are caught/corrected. Please notify the dino if any discrepancies are noted or if the meaning of any statement is not clear.   This note was created with assistance from [employer] via capture of conversational audio.  Consent was obtained from the patient prior to recording.             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    Knee pain, right    Obesity (BMI 35.0-39.9 without comorbidity)    Borderline diabetes    Irritable bowel syndrome with constipation    DUB (dysfunctional uterine bleeding)    Gastroparesis    Hyperlipidemia LDL goal <70    Insomnia    Vitamin D  deficiency    Paraspinal muscle spasm  Migraine    GERD (gastroesophageal reflux disease)    Pelvic pain    Menorrhagia with irregular cycle    S/P Nissen fundoplication (without gastrostomy tube) procedure    Vaginal discharge    Fibroids, intramural    Menopausal symptoms    Hair loss    Irregular periods/menstrual cycles    Kidney stone    Lumbar back pain with radiculopathy affecting right lower extremity    Bilateral hand pain    Xerosis cutis    Ankle edema, bilateral    Acute left-sided thoracic back pain    Acute left flank pain   [2]   Allergies  Allergen Reactions    Other Rash     Allergic to the sun

## 2024-08-31 ENCOUNTER — Ambulatory Visit (RURAL_HEALTH_CENTER): Payer: Self-pay | Admitting: Family Medicine

## 2024-09-01 LAB — HEPATITIS C ANTIBODY SCREEN WITH REFLEX TO HCV PCR: HCV ANTIBODY QUALITATIVE: NEGATIVE

## 2024-09-03 ENCOUNTER — Telehealth (RURAL_HEALTH_CENTER): Payer: Self-pay | Admitting: Family Medicine

## 2024-09-03 MED ORDER — OMEPRAZOLE 40 MG CAPSULE,DELAYED RELEASE
40.0000 mg | DELAYED_RELEASE_CAPSULE | Freq: Two times a day (BID) | ORAL | 3 refills | Status: AC
Start: 2024-09-03 — End: 2025-09-03

## 2024-09-04 LAB — OPIATES CONFIRMATORY/DEFINITIVE, URINE, BY LC-MS/MS (PERFORMABLE)
CODEINE: NOT DETECTED ng/mL (ref <25)
DIHYDROCODEINE: 322 ng/mL — ABNORMAL HIGH (ref <25)
HYDROCODONE: >1000 ng/mL — ABNORMAL HIGH (ref <25)
HYDROMORPHONE: 302 ng/mL — ABNORMAL HIGH (ref <25)
MORPHINE: NOT DETECTED ng/mL (ref <25)
NORHYDROCODONE: >1000 ng/mL — ABNORMAL HIGH (ref <25)
NOROXYCODONE: NOT DETECTED ng/mL (ref <25)
OXYCODONE: NOT DETECTED ng/mL (ref <25)
OXYMORPHONE: NOT DETECTED ng/mL (ref <25)

## 2024-09-06 LAB — LIVER FIBROSIS, FIBROTEST PNL
ALPHA-2-MACROGLOBULIN: 242 mg/dL (ref 106–279)
ALT: 30 U/L — ABNORMAL HIGH (ref 6–29)
APOLIPOPROTEIN A1: 178 mg/dL (ref 101–198)
FIBROSIS SCORE: 0.22
GGT: 101 U/L — ABNORMAL HIGH (ref 3–55)
HAPTOGLOBIN: 125 mg/dL (ref 43–212)
NECROINFLAMMAT ACT SCORE: 0.13
REFERENCE ID: 5729894
TOTAL BILIRUBIN: 0.4 mg/dL (ref 0.2–1.2)

## 2024-09-21 ENCOUNTER — Telehealth (RURAL_HEALTH_CENTER): Payer: Self-pay | Admitting: Family Medicine

## 2024-09-21 NOTE — Telephone Encounter (Signed)
 Patient left voicemail that her back surgery has been scheduled for October 30, 2024

## 2024-09-25 ENCOUNTER — Other Ambulatory Visit (RURAL_HEALTH_CENTER): Payer: Self-pay | Admitting: Family Medicine

## 2024-09-27 ENCOUNTER — Encounter (RURAL_HEALTH_CENTER): Payer: Self-pay

## 2024-11-28 ENCOUNTER — Ambulatory Visit (RURAL_HEALTH_CENTER): Payer: Self-pay | Admitting: Family Medicine

## 2024-12-03 ENCOUNTER — Ambulatory Visit (RURAL_HEALTH_CENTER): Payer: MEDICAID | Attending: Family Medicine | Admitting: Family Medicine

## 2024-12-03 ENCOUNTER — Ambulatory Visit (RURAL_HEALTH_CENTER): Payer: Self-pay | Admitting: Family Medicine

## 2024-12-03 ENCOUNTER — Other Ambulatory Visit: Payer: Self-pay

## 2024-12-03 ENCOUNTER — Encounter (RURAL_HEALTH_CENTER): Payer: Self-pay | Admitting: Family Medicine

## 2024-12-03 ENCOUNTER — Ambulatory Visit: Payer: MEDICAID | Attending: Family Medicine | Admitting: Family Medicine

## 2024-12-03 DIAGNOSIS — M5416 Radiculopathy, lumbar region: Secondary | ICD-10-CM | POA: Insufficient documentation

## 2024-12-03 DIAGNOSIS — R300 Dysuria: Secondary | ICD-10-CM | POA: Insufficient documentation

## 2024-12-03 DIAGNOSIS — K760 Fatty (change of) liver, not elsewhere classified: Secondary | ICD-10-CM | POA: Insufficient documentation

## 2024-12-03 DIAGNOSIS — E785 Hyperlipidemia, unspecified: Secondary | ICD-10-CM | POA: Insufficient documentation

## 2024-12-03 DIAGNOSIS — E530 Riboflavin deficiency: Secondary | ICD-10-CM | POA: Insufficient documentation

## 2024-12-03 DIAGNOSIS — K3184 Gastroparesis: Secondary | ICD-10-CM | POA: Insufficient documentation

## 2024-12-03 DIAGNOSIS — R7303 Prediabetes: Secondary | ICD-10-CM | POA: Insufficient documentation

## 2024-12-03 DIAGNOSIS — E66811 Obesity, class 1: Secondary | ICD-10-CM | POA: Insufficient documentation

## 2024-12-03 DIAGNOSIS — D649 Anemia, unspecified: Secondary | ICD-10-CM | POA: Insufficient documentation

## 2024-12-03 DIAGNOSIS — Z981 Arthrodesis status: Secondary | ICD-10-CM | POA: Insufficient documentation

## 2024-12-03 DIAGNOSIS — R58 Hemorrhage, not elsewhere classified: Secondary | ICD-10-CM | POA: Insufficient documentation

## 2024-12-03 DIAGNOSIS — K219 Gastro-esophageal reflux disease without esophagitis: Secondary | ICD-10-CM | POA: Insufficient documentation

## 2024-12-03 DIAGNOSIS — K5903 Drug induced constipation: Secondary | ICD-10-CM | POA: Insufficient documentation

## 2024-12-03 DIAGNOSIS — R32 Unspecified urinary incontinence: Secondary | ICD-10-CM | POA: Insufficient documentation

## 2024-12-03 DIAGNOSIS — T402X5A Adverse effect of other opioids, initial encounter: Secondary | ICD-10-CM | POA: Insufficient documentation

## 2024-12-03 LAB — COMPREHENSIVE METABOLIC PNL, FASTING
ALBUMIN/GLOBULIN RATIO: 1.3 (ref 0.8–1.4)
ALBUMIN: 4.3 g/dL (ref 3.5–5.7)
ALKALINE PHOSPHATASE: 124 U/L — ABNORMAL HIGH (ref 34–104)
ALT (SGPT): 15 U/L (ref 7–52)
ANION GAP: 7 mmol/L (ref 4–13)
AST (SGOT): 22 U/L (ref 13–39)
BILIRUBIN TOTAL: 0.4 mg/dL (ref 0.3–1.0)
BUN/CREA RATIO: 13 (ref 6–22)
BUN: 10 mg/dL (ref 7–25)
CALCIUM, CORRECTED: 9.5 mg/dL (ref 8.9–10.8)
CALCIUM: 9.7 mg/dL (ref 8.6–10.3)
CHLORIDE: 102 mmol/L (ref 98–107)
CO2 TOTAL: 27 mmol/L (ref 21–31)
CREATININE: 0.79 mg/dL (ref 0.60–1.30)
ESTIMATED GFR: 93 mL/min/1.73mˆ2 (ref >59)
GLOBULIN: 3.3 (ref 2.0–3.5)
GLUCOSE: 104 mg/dL (ref 74–109)
OSMOLALITY, CALCULATED: 271 mosm/kg (ref 270–290)
POTASSIUM: 4.3 mmol/L (ref 3.5–5.1)
PROTEIN TOTAL: 7.6 g/dL (ref 6.4–8.9)
SODIUM: 136 mmol/L (ref 136–145)

## 2024-12-03 LAB — IRON TRANSFERRIN AND TIBC
IRON (TRANSFERRIN) SATURATION: 11 % — ABNORMAL LOW (ref 15–50)
IRON: 55 ug/dL (ref 50–212)
TOTAL IRON BINDING CAPACITY: 491 ug/dL — ABNORMAL HIGH (ref 250–450)
TRANSFERRIN: 351 mg/dL (ref 203–362)
UIBC: 436 ug/dL — ABNORMAL HIGH (ref 130–375)

## 2024-12-03 LAB — CBC WITH DIFF
BASOPHIL %: 1 % (ref 0–1)
EOSINOPHIL #: 0.3 x10ˆ3/uL (ref 0.00–0.50)
EOSINOPHIL %: 4 % (ref 1–7)
HCT: 31.1 % — ABNORMAL LOW (ref 31.2–41.9)
HGB: 10.4 g/dL — ABNORMAL LOW (ref 10.9–14.3)
LYMPHOCYTE #: 1.4 x10ˆ3/uL (ref 1.10–3.10)
LYMPHOCYTE %: 19 % (ref 16–46)
MCH: 28.9 pg (ref 24.7–32.8)
MCHC: 33.3 g/dL (ref 32.3–35.6)
MCV: 86.9 fL (ref 75.5–95.3)
MONOCYTE %: 4 % (ref 4–11)
MPV: 7.8 fL — ABNORMAL LOW (ref 7.9–10.8)
NEUTROPHIL #: 5 x10ˆ3/uL (ref 1.90–8.20)
NEUTROPHIL %: 71 % (ref 43–77)
PLATELETS: 324 x10ˆ3/uL (ref 140–440)
RBC: 3.58 x10ˆ6/uL — ABNORMAL LOW (ref 3.63–4.92)
RDW: 17.8 % — ABNORMAL HIGH (ref 12.3–17.7)
WBC: 7.1 x10ˆ3/uL (ref 3.8–11.8)

## 2024-12-03 LAB — URIC ACID: URIC ACID: 4.9 mg/dL (ref 2.3–7.6)

## 2024-12-03 LAB — LIPID PANEL
CHOL/HDL RATIO: 3.2
CHOLESTEROL: 189 mg/dL (ref <200)
HDL CHOL: 60 mg/dL (ref >=40)
LDL CALC: 101 mg/dL — ABNORMAL HIGH (ref 0–100)
TRIGLYCERIDES: 139 mg/dL (ref <=150)
VLDL CALC: 28 mg/dL (ref 0–50)

## 2024-12-03 LAB — URINALYSIS, MACROSCOPIC
BILIRUBIN: NEGATIVE mg/dL
BLOOD: NEGATIVE mg/dL
GLUCOSE: NEGATIVE mg/dL
KETONES: NEGATIVE mg/dL
LEUKOCYTES: NEGATIVE WBCs/uL
NITRITE: NEGATIVE
PH: 7.5 (ref 5.0–9.0)
PROTEIN: 10 mg/dL
SPECIFIC GRAVITY: 1.025 (ref 1.002–1.030)
UROBILINOGEN: NORMAL mg/dL

## 2024-12-03 LAB — MAGNESIUM: MAGNESIUM: 2 mg/dL (ref 1.9–2.7)

## 2024-12-03 LAB — THYROID STIMULATING HORMONE (SENSITIVE TSH): TSH: 2.734 u[IU]/mL (ref 0.450–5.330)

## 2024-12-03 LAB — URINALYSIS, MICROSCOPIC
BACTERIA: NEGATIVE /HPF
SQUAMOUS EPITHELIAL: 8 /HPF (ref <28)
WBCS: 1 /HPF (ref <6)

## 2024-12-03 LAB — VITAMIN D 25 TOTAL: VITAMIN D 25, TOTAL: 29.98 ng/mL — ABNORMAL LOW (ref 30.00–100.00)

## 2024-12-03 LAB — VITAMIN B12: VITAMIN B 12: 453 pg/mL (ref 180–914)

## 2024-12-03 MED ORDER — NALOXEGOL 25 MG TABLET: 25.0000 mg | ORAL_TABLET | Freq: Every day | ORAL | 3 refills | Status: AC

## 2024-12-03 MED ORDER — GABAPENTIN 800 MG TABLET: 800.0000 mg | ORAL_TABLET | Freq: Four times a day (QID) | ORAL | 5 refills | Status: AC

## 2024-12-03 NOTE — Progress Notes (Signed)
 FAMILY MEDICINE, [facility] FAMILY MEDICINE [facility]  [address]  [place] [address]  Operated by [facility]  Progress Note    Name: Lindsay Crawford MRN:  [id number]   Date: 12/03/2024 DOB:  01/03/1976 (49 y.o.)             Assessment & Plan    Assessment & Plan  Lumbar radiculopathy status post lumbar fusion  Status post lumbar fusion at L4, L5, S1  with ongoing pain in the lower back, incision site, and left hip. Pain management with oxycodone and gabapentin .   Surgeon is managing post op pain but I am continuing gabapentin  which also helps. Med sent. Keep f/u with surgeon  - Continue oxycodone and gabapentin  for pain management.  - Attend follow-up appointment with surgeon to assess incision sites and discuss further therapy.    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 Current regimen includes Linzess , stool softeners, and Senokot, but bowel movements occur every 5-6 days.  - Prescribed movantik to aid bowel movements while on opioids.  - Continue Linzess , stool softeners, and Senokot.    Obesity, Class I  Obesity management with semaglutide  for weight loss. She has not resumed semaglutide  post-surgery but has leftover medication. Discussed potential benefits for weight loss and cravings, though not proven for pain relief.  - Consider resuming semaglutide  at a reduced dose to avoid gastrointestinal side effects.    Nonalcoholic fatty liver disease  Present. Continue maintenance of previous weight loss as 7% of total body weight loss can help decrease risk of development of MAFLD.    Gastroesophageal reflux disease  Intermittent heartburn reported. Does ok on meds. To continue    Urinary incontinence and dysuria  Intermittent urinary incontinence and dysuria, possibly indicating a urinary tract infection. No skin breakdown noted.  - Obtained urine specimen to check for urinary tract infection.  -  Provided adult pull-ups for incontinence management.    Postoperative anemia due to blood loss  Postoperative anemia following lumbar fusion surgery with significant blood loss. Previous blood work in October; current iron  levels need assessment.  - Ordered blood work to assess iron  levels post-surgery.               ICD-10-CM    1. Lumbar back pain with radiculopathy affecting right lower extremity  M54.16 IRON  TRANSFERRIN AND TIBC     FERRITIN     CBC/DIFF     COMPREHENSIVE METABOLIC PNL, FASTING     HGA1C (HEMOGLOBIN A1C WITH EST AVG GLUCOSE)     URINALYSIS, MACROSCOPIC AND MICROSCOPIC W/CULTURE REFLEX     LIPID PANEL     MAGNESIUM      THYROID  STIMULATING HORMONE (SENSITIVE TSH)     URIC ACID     VITAMIN B12     VITAMIN D  25 TOTAL      2. S/P lumbar fusion  Z98.1 IRON  TRANSFERRIN AND TIBC     FERRITIN     CBC/DIFF     COMPREHENSIVE METABOLIC PNL, FASTING     HGA1C (HEMOGLOBIN A1C WITH EST AVG GLUCOSE)     URINALYSIS, MACROSCOPIC AND MICROSCOPIC W/CULTURE REFLEX     LIPID PANEL     MAGNESIUM      THYROID  STIMULATING HORMONE (SENSITIVE TSH)     URIC ACID     VITAMIN B12     VITAMIN D  25 TOTAL      3. Opioid-induced constipation  K59.03  T40.2X5A       4. Obesity, Class I, BMI 30-34.9  E66.811       5. Fatty liver disease, nonalcoholic  K76.0 IRON  TRANSFERRIN AND TIBC     FERRITIN     CBC/DIFF     COMPREHENSIVE METABOLIC PNL, FASTING     HGA1C (HEMOGLOBIN A1C WITH EST AVG GLUCOSE)     URINALYSIS, MACROSCOPIC AND MICROSCOPIC W/CULTURE REFLEX     LIPID PANEL     MAGNESIUM      THYROID  STIMULATING HORMONE (SENSITIVE TSH)     URIC ACID     VITAMIN B12     VITAMIN D  25 TOTAL      6. Urinary incontinence  R32       7. Dysuria  R30.0 IRON  TRANSFERRIN AND TIBC     FERRITIN     CBC/DIFF     COMPREHENSIVE METABOLIC PNL, FASTING     HGA1C (HEMOGLOBIN A1C WITH EST AVG GLUCOSE)     URINALYSIS, MACROSCOPIC AND MICROSCOPIC W/CULTURE REFLEX     LIPID PANEL     MAGNESIUM      THYROID  STIMULATING HORMONE (SENSITIVE TSH)     URIC ACID      VITAMIN B12     VITAMIN D  25 TOTAL      8. Gastroesophageal reflux disease, unspecified whether esophagitis present  K21.9 IRON  TRANSFERRIN AND TIBC     FERRITIN     CBC/DIFF     COMPREHENSIVE METABOLIC PNL, FASTING     HGA1C (HEMOGLOBIN A1C WITH EST AVG GLUCOSE)     URINALYSIS, MACROSCOPIC AND MICROSCOPIC W/CULTURE REFLEX     LIPID PANEL     MAGNESIUM      THYROID  STIMULATING HORMONE (SENSITIVE TSH)     URIC ACID     VITAMIN B12     VITAMIN D  25 TOTAL      9. Blood loss  R58 IRON  TRANSFERRIN AND TIBC     FERRITIN     CBC/DIFF     COMPREHENSIVE METABOLIC PNL, FASTING     HGA1C (HEMOGLOBIN A1C WITH EST AVG GLUCOSE)     URINALYSIS, MACROSCOPIC AND MICROSCOPIC W/CULTURE REFLEX     LIPID PANEL     MAGNESIUM      THYROID  STIMULATING HORMONE (SENSITIVE TSH)     URIC ACID     VITAMIN B12     VITAMIN D  25 TOTAL      10. Borderline diabetes  R73.03 IRON  TRANSFERRIN AND TIBC     FERRITIN     CBC/DIFF     COMPREHENSIVE METABOLIC PNL, FASTING     HGA1C (HEMOGLOBIN A1C WITH EST AVG GLUCOSE)     URINALYSIS, MACROSCOPIC AND MICROSCOPIC W/CULTURE REFLEX     LIPID PANEL     MAGNESIUM      THYROID  STIMULATING HORMONE (SENSITIVE TSH)     URIC ACID     VITAMIN B12     VITAMIN D  25 TOTAL      11. Hyperlipidemia, unspecified hyperlipidemia type  E78.5 IRON  TRANSFERRIN AND TIBC     FERRITIN     CBC/DIFF     COMPREHENSIVE METABOLIC PNL, FASTING     HGA1C (HEMOGLOBIN A1C WITH EST AVG GLUCOSE)     URINALYSIS, MACROSCOPIC AND MICROSCOPIC W/CULTURE REFLEX     LIPID PANEL     MAGNESIUM      THYROID  STIMULATING HORMONE (SENSITIVE TSH)     URIC ACID     VITAMIN B12     VITAMIN D  25 TOTAL      12.  Gastroparesis  K31.84 IRON  TRANSFERRIN AND TIBC     FERRITIN     CBC/DIFF     COMPREHENSIVE METABOLIC PNL, FASTING     HGA1C (HEMOGLOBIN A1C WITH EST AVG GLUCOSE)     URINALYSIS, MACROSCOPIC AND MICROSCOPIC W/CULTURE REFLEX     LIPID PANEL     MAGNESIUM      THYROID  STIMULATING HORMONE (SENSITIVE TSH)     URIC ACID     VITAMIN B12     VITAMIN D  25 TOTAL       13. Riboflavin (vitamin B2) deficiency  E53.0 IRON  TRANSFERRIN AND TIBC     FERRITIN     CBC/DIFF     COMPREHENSIVE METABOLIC PNL, FASTING     HGA1C (HEMOGLOBIN A1C WITH EST AVG GLUCOSE)     URINALYSIS, MACROSCOPIC AND MICROSCOPIC W/CULTURE REFLEX     LIPID PANEL     MAGNESIUM      THYROID  STIMULATING HORMONE (SENSITIVE TSH)     URIC ACID     VITAMIN B12     VITAMIN D  25 TOTAL           Orders Placed This Encounter    IRON  TRANSFERRIN AND TIBC    FERRITIN    CBC/DIFF    COMPREHENSIVE METABOLIC PNL, FASTING    HGA1C (HEMOGLOBIN A1C WITH EST AVG GLUCOSE)    URINALYSIS, MACROSCOPIC AND MICROSCOPIC W/CULTURE REFLEX    LIPID PANEL    MAGNESIUM     THYROID  STIMULATING HORMONE (SENSITIVE TSH)    URIC ACID    VITAMIN B12    VITAMIN D  25 TOTAL    CBC WITH DIFF    URINALYSIS, MACROSCOPIC    URINALYSIS, MICROSCOPIC    naloxegoL  (MOVANTIK) 25 mg Oral Tablet    gabapentin  (NEURONTIN ) 800 mg Oral Tablet     Medications Discontinued During This Encounter   Medication Reason    cyclobenzaprine  (FLEXERIL ) 10 mg Oral Tablet     gabapentin  (NEURONTIN ) 800 mg Oral Tablet Reorder       Chief Complaint: Follow Up (Back surgery 11/06/24 [place] fusion L4 L 5 s1)    Subjective:  History of Present Illness  Lindsay Crawford is a 49 year old female with gastroparesis, GERD, obesity, borderline diabetes, chronic low back pain and DUB (btl and ablation) presents  for chronic care follow up.    She underwent back surgery on December 9th, 2025, involving L4, L5 and S1 fusion. She was hospitalized for several days afterward and sent home with pain management from her surgeon. She experiences ongoing lower back pain, an incision on her stomach, and muscle spasms in her left hip, described as feeling like 'a razor burn.' Pain management includes oxycodone 5 mg, two tablets every four hours, and gabapentin  four times a day.    She experiences urinary incontinence that occurs when her back 'acts up.' No skin breakdown is reported. She requires  assistance with dressing, particularly with putting on socks.    She has not resumed her once-weekly injectable medication for weight loss post-surgery. She has not received a flu shot this year and declines one. She is not currently taking Flexeril  or Ozempic, though she has leftover Ozempic pens.    She experiences constipation, with bowel movements every five to six days despite taking the highest dose of Linzess , stool softeners, and Senokot at night. She also has Miralax  available. No black tarry stools or bloody stools are reported.    She reports fatigue levels are unchanged and experiences intermittent heartburn. She has  had recent migraines and suspects a urinary tract infection due to intermittent pain. She notes a little brown discharge at her incision sites.    She has not returned to work since the surgery and is awaiting therapy recommendations. She stayed in the hospital until the Friday following her surgery and lost two units of blood during the procedure. She has a postop follow up later today with her surgical office       Objective:  BP 118/68 (Site: Left Arm, Patient Position: Sitting, Cuff Size: Adult Large)   Pulse 88   Temp 37 C (98.6 F) (Tympanic)   Wt 103 kg (226 lb)   SpO2 97%   BMI 34.36 kg/m       Physical Exam   Physical Exam    GENERAL: Alert, cooperative, well developed, no acute distress.  HEENT: Normocephalic, normal oropharynx, moist mucous membranes.  CHEST: Clear to auscultation bilaterally, no wheezes, rhonchi, or crackles.  CARDIOVASCULAR: Normal heart rate and rhythm, S1 and S2 normal without murmurs.  ABDOMEN: Soft, non-tender,  EXTREMITIES: No cyanosis, edema, or swelling in calves.  MUSCULOSKELETAL: Normal range of motion in toes. Wearing back brace that extends from lower Thoracic spine to sacrum.  NEUROLOGICAL: Cranial nerves grossly intact, moves all extremities without gross motor or sensory deficit.     Results         Data reviewed:      Current Outpatient  Medications   Medication Sig    albuterol  sulfate (PROVENTIL  OR VENTOLIN  OR PROAIR ) 90 mcg/actuation Inhalation oral inhaler Take 2 Puffs by inhalation Every 6 hours as needed    bethanechol  chloride (URECHOLINE ) 10 mg Oral Tablet Take 1 tablet by mouth twice daily    Biotin 10 mg Oral Tablet Take 1 Tablet (10 mg total) by mouth Daily    cetirizine  (ZYRTEC ) 10 mg Oral Tablet Take 1 Tablet (10 mg total) by mouth Daily    Colestipol (COLESTID) 1 gram Oral Tablet Take 2 Tablets (2 g total) by mouth Daily Takes two in am    gabapentin  (NEURONTIN ) 800 mg Oral Tablet Take 1 Tablet (800 mg total) by mouth Four times a day    linaCLOtide  (LINZESS ) 290 mcg Oral Capsule Take 1 Capsule (290 mcg total) by mouth Every morning    MetFORMIN  (GLUCOPHAGE ) 1,000 mg Oral Tablet Take 1 Tablet (1,000 mg total) by mouth Twice daily with food    methocarbamoL  (ROBAXIN ) 500 mg Oral Tablet Take 1 Tablet (500 mg total) by mouth Four times a day    naloxegoL  (MOVANTIK) 25 mg Oral Tablet Take 1 Tablet (25 mg total) by mouth Daily Indications: opiate pain medication causing severe constipation    naloxone  (NARCAN ) 4 mg per spray nasal spray 1 Spray by INTRANASAL route Every 2 minutes as needed for actual or suspected opioid overdose. Call 911 if used.    omeprazole  (PRILOSEC) 40 mg Oral Capsule, Delayed Release(E.C.) Take 1 Capsule (40 mg total) by mouth Twice daily    oxyCODONE (OXY IR) 5 mg Oral Capsule Take 2 Capsules (10 mg total) by mouth Every 4 hours as needed for Pain (qid per surgeon)    QUEtiapine  (SEROQUEL ) 100 mg Oral Tablet Take 1 Tablet (100 mg total) by mouth Twice daily    rosuvastatin  (CRESTOR ) 5 mg Oral Tablet Take 1 Tablet (5 mg total) by mouth Daily    semaglutide  (OZEMPIC) 2 mg/dose (8 mg/3 mL) Subcutaneous Pen Injector Inject 2 mg under the skin Every 7 days (Patient not taking: Reported on  12/03/2024)    venlafaxine  (EFFEXOR  XR) 150 mg Oral Capsule, Sust. Release 24 hr Take 1 Capsule (150 mg total) by mouth Daily        Jhonny Romano, DO     This note was created with assistance from [employer] via capture of conversational audio. Consent was obtained from the patient and all parties present prior to recording.

## 2024-12-04 LAB — HGA1C (HEMOGLOBIN A1C WITH EST AVG GLUCOSE): HEMOGLOBIN A1C: <4 % — ABNORMAL LOW (ref 4.0–6.0)

## 2024-12-06 ENCOUNTER — Ambulatory Visit (RURAL_HEALTH_CENTER): Payer: MEDICAID | Admitting: Family Medicine

## 2024-12-06 ENCOUNTER — Encounter (RURAL_HEALTH_CENTER): Payer: Self-pay

## 2024-12-20 ENCOUNTER — Encounter (RURAL_HEALTH_CENTER): Payer: Self-pay | Admitting: Family

## 2024-12-20 ENCOUNTER — Ambulatory Visit (RURAL_HEALTH_CENTER): Payer: MEDICAID | Attending: Family | Admitting: Family

## 2024-12-20 ENCOUNTER — Other Ambulatory Visit: Payer: Self-pay

## 2024-12-20 ENCOUNTER — Other Ambulatory Visit (RURAL_HEALTH_CENTER): Payer: Self-pay | Admitting: Family Medicine

## 2024-12-20 VITALS — BP 136/91 | HR 82 | Temp 97.6°F | Resp 18 | Ht 68.0 in | Wt 233.4 lb

## 2024-12-20 DIAGNOSIS — E669 Obesity, unspecified: Secondary | ICD-10-CM | POA: Insufficient documentation

## 2024-12-20 DIAGNOSIS — Z6835 Body mass index (BMI) 35.0-35.9, adult: Secondary | ICD-10-CM | POA: Insufficient documentation

## 2024-12-20 DIAGNOSIS — T8189XA Other complications of procedures, not elsewhere classified, initial encounter: Secondary | ICD-10-CM | POA: Insufficient documentation

## 2024-12-20 MED ORDER — QUETIAPINE 100 MG TABLET: 100.0000 mg | ORAL_TABLET | Freq: Two times a day (BID) | ORAL | 0 refills | Status: AC

## 2024-12-20 MED ORDER — ROSUVASTATIN 5 MG TABLET: 5.0000 mg | ORAL_TABLET | Freq: Every day | ORAL | 0 refills | Status: DC

## 2024-12-20 MED ORDER — METFORMIN 1,000 MG TABLET: 1000.0000 mg | ORAL_TABLET | Freq: Two times a day (BID) | ORAL | 3 refills | Status: AC

## 2024-12-20 MED ORDER — CETIRIZINE 10 MG TABLET: 10.0000 mg | ORAL_TABLET | Freq: Every day | ORAL | 0 refills | Status: AC

## 2024-12-20 NOTE — Nursing Note (Signed)
 Patient here today with complaints of having a painful knot in her surgery scar.

## 2024-12-20 NOTE — Progress Notes (Signed)
 FAMILY MEDICINE, [facility]  [address]  [place] [address]  Operated by [facility]     Name: Lindsay Crawford MRN:  [id number]   Date of Birth: 26-Nov-1976 Age: 49 y.o.   Date: 12/20/2024  Time: 15:00     Provider: Greig LITTIE Mills, APRN, CNP    Reason for visit: Follow Up        History of Present Illness:   History of Present Illness  Lindsay Crawford is a 49 year old female who presents with post-surgical concerns following back surgery.    She experiences soreness and a knot at the site of her surgical incision, which is tender to touch. The back surgery involved an initial approach through the abdomen to remove a vertebra, but due to excessive bleeding, the procedure was completed through the back.     She has recently returned to work but has not yet started physical therapy.       Patient Active Problem List    Diagnosis Date Noted    Acute left-sided thoracic back pain 02/12/2024    Acute left flank pain 02/12/2024    Bilateral hand pain 10/31/2023    Xerosis cutis 10/31/2023    Ankle edema, bilateral 10/31/2023    Lumbar back pain with radiculopathy affecting right lower extremity 09/14/2023    Kidney stone 05/11/2023    Irregular periods/menstrual cycles 02/07/2023    Menopausal symptoms 12/29/2022    Hair loss 12/29/2022    Vaginal discharge 11/11/2022    Fibroids, intramural 11/11/2022    S/P Nissen fundoplication (without gastrostomy tube) procedure 08/25/2022    Pelvic pain 07/27/2022    Menorrhagia with irregular cycle 07/27/2022    DUB (dysfunctional uterine bleeding) 05/25/2022    Gastroparesis 05/25/2022    Hyperlipidemia LDL goal <70 05/25/2022    Insomnia 05/25/2022    Vitamin D  deficiency 05/25/2022    Paraspinal muscle spasm 05/25/2022    Migraine 05/25/2022    GERD (gastroesophageal reflux disease) 05/25/2022    Knee pain, right 02/03/2022    Obesity (BMI 35.0-39.9 without comorbidity) 02/03/2022    Borderline diabetes 02/03/2022     Irritable bowel syndrome with constipation 02/03/2022    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       Historical Data    Past Medical History:  Past Medical History:   Diagnosis Date    Body mass index 35.0-35.9, adult     Borderline diabetes     Chronic low back pain     Dizziness     DUB (dysfunctional uterine bleeding)     Gastroparesis     GERD (gastroesophageal reflux disease)     History of back surgery     fusion L4L5 S1 [place] 12/25    Hyperglycemia     Hyperlipidemia LDL goal <70     Insomnia     Knee pain, bilateral     Migraine     Paraspinal muscle spasm     Vitamin D  deficiency      Past Surgical History:  Past Surgical History:   Procedure Laterality Date    CESAREAN SECTION  2004    COLONOSCOPY      Dr. Joesph in [place]    ENDOMETRIAL ABLATION W/ NOVASURE      dr brodnik 12/14/22    ESOPHAGOSCOPY / EGD      HX APPENDECTOMY      HX BACK SURGERY      november 2019  HX LAP CHOLECYSTECTOMY      LAPAROSCOPIC TUBAL LIGATION  07/30/2022    with D&C brodmik    NISSEN FUNDOPLICATION  08/13/2022    had in [place]     Allergies:  Allergies[1]  Medications:  Current Outpatient Medications   Medication Sig    albuterol  sulfate (PROVENTIL  OR VENTOLIN  OR PROAIR ) 90 mcg/actuation Inhalation oral inhaler Take 2 Puffs by inhalation Every 6 hours as needed    bethanechol  chloride (URECHOLINE ) 10 mg Oral Tablet Take 1 tablet by mouth twice daily    Biotin 10 mg Oral Tablet Take 1 Tablet (10 mg total) by mouth Daily    cetirizine  (ZYRTEC ) 10 mg Oral Tablet Take 1 Tablet (10 mg total) by mouth Daily    Colestipol (COLESTID) 1 gram Oral Tablet Take 2 Tablets (2 g total) by mouth Daily Takes two in am    gabapentin  (NEURONTIN ) 800 mg Oral Tablet Take 1 Tablet (800 mg total) by mouth Four times a day    linaCLOtide  (LINZESS ) 290 mcg Oral Capsule Take 1 Capsule (290 mcg total) by mouth Every morning    MetFORMIN  (GLUCOPHAGE ) 1,000 mg Oral Tablet Take 1 Tablet (1,000 mg total) by mouth Twice daily with  food    methocarbamoL  (ROBAXIN ) 500 mg Oral Tablet Take 1 Tablet (500 mg total) by mouth Four times a day    naloxegoL  (MOVANTIK) 25 mg Oral Tablet Take 1 Tablet (25 mg total) by mouth Daily Indications: opiate pain medication causing severe constipation    naloxone  (NARCAN ) 4 mg per spray nasal spray 1 Spray by INTRANASAL route Every 2 minutes as needed for actual or suspected opioid overdose. Call 911 if used.    omeprazole  (PRILOSEC) 40 mg Oral Capsule, Delayed Release(E.C.) Take 1 Capsule (40 mg total) by mouth Twice daily    oxyCODONE (OXY IR) 5 mg Oral Capsule Take 2 Capsules (10 mg total) by mouth Every 4 hours as needed for Pain (qid per surgeon)    QUEtiapine  (SEROQUEL ) 100 mg Oral Tablet Take 1 Tablet (100 mg total) by mouth Twice daily    rosuvastatin  (CRESTOR ) 5 mg Oral Tablet Take 1 Tablet (5 mg total) by mouth Daily    semaglutide  (OZEMPIC) 2 mg/dose (8 mg/3 mL) Subcutaneous Pen Injector Inject 2 mg under the skin Every 7 days (Patient not taking: Reported on 12/03/2024)    venlafaxine  (EFFEXOR  XR) 150 mg Oral Capsule, Sust. Release 24 hr Take 1 Capsule (150 mg total) by mouth Daily     Family History:  Family Medical History:       Problem Relation (Age of Onset)    COPD Father    Coronary Artery Disease Brother    Diabetes type II Father    Hypertension (High Blood Pressure) Mother, Father    Kidney Disease Father    No Known Problems Half-Sister, Half-Brother, Maternal Aunt, Maternal Uncle, Paternal Aunt, Paternal Uncle, Maternal Grandmother, Maternal Grandfather, Paternal Grandmother, Paternal Grandfather, Daughter, Son    Throat cancer Sister            Social History:  Social History     Socioeconomic History    Marital status: Widowed    Number of children: 1    Years of education: 12+    Highest education level: Associate degree: occupational, scientist, product/process development, or vocational program   Tobacco Use    Smoking status: Never    Smokeless tobacco: Never   Vaping Use    Vaping status: Never Used   Substance  and Sexual Activity  Alcohol use: Never    Drug use: Never    Sexual activity: Yes     Partners: Male     Birth control/protection: None     Social Determinants of Health     Financial Resource Strain: Medium Risk (11/07/2024)    Received from [facility]    Overall Financial Resource Strain (CARDIA)     How hard is it for you to pay for the very basics like food, housing, medical care, and heating?: Somewhat hard   Transportation Needs: No Transportation Needs (11/07/2024)    Received from [facility] - Transportation     In the past 12 months, has lack of transportation kept you from medical appointments or from getting medications?: No     In the past 12 months, has lack of transportation kept you from meetings, work, or from getting things needed for daily living?: No   Social Connections: Moderately Isolated (11/07/2024)    Received from [facility]    Social Connection and Isolation Panel     In a typical week, how many times do you talk on the phone with family, friends, or neighbors?: More than three times a week     How often do you get together with friends or relatives?: More than three times a week     How often do you attend church or religious services?: Never     Do you belong to any clubs or organizations such as church groups, unions, fraternal or athletic groups, or school groups?: No     How often do you attend meetings of the clubs or organizations you belong to?: Never     Are you married, widowed, divorced, separated, never married, or living with a partner?: Living with partner   Intimate Partner Violence: Not At Risk (11/07/2024)    Received from [facility]    Humiliation, Afraid, Rape, and Kick questionnaire     Within the last year, have you been afraid of your partner or ex-partner?: No     Within the last year, have you been humiliated or emotionally abused in other ways by your partner or ex-partner?: No     Within the last year, have you been kicked, hit,  slapped, or otherwise physically hurt by your partner or ex-partner?: No     Within the last year, have you been raped or forced to have any kind of sexual activity by your partner or ex-partner?: No   Housing Stability: Low Risk  (11/07/2024)    Received from [facility] Stability Vital Sign     In the last 12 months, was there a time when you were not able to pay the mortgage or rent on time?: No     In the past 12 months, how many times have you moved where you were living?: 0     At any time in the past 12 months, were you homeless or living in a shelter (including now)?: No           Review of Systems:  Any pertinent Review of Systems as addressed in the HPI above.    Physical Exam:  Vital Signs:  Vitals:    12/20/24 1435   BP: (!) 136/91   Pulse: 82   Resp: 18   Temp: 36.4 C (97.6 F)   TempSrc: Temporal   SpO2: 98%   Weight: 106 kg (233 lb 6 oz)   Height: 1.727 m (5' 8)  BMI: 35.48     Physical Exam  Vitals reviewed.   Constitutional:       Appearance: Normal appearance. She is well-developed and well-groomed. She is obese. She is not ill-appearing.      Comments: Lindsay Crawford looks good postop.   HENT:      Head: Normocephalic and atraumatic.   Eyes:      General: No scleral icterus.     Conjunctiva/sclera: Conjunctivae normal.   Cardiovascular:      Rate and Rhythm: Normal rate.   Pulmonary:      Effort: Pulmonary effort is normal.   Abdominal:      Palpations: Abdomen is soft.      Tenderness: There is no abdominal tenderness.   Skin:     Coloration: Skin is not cyanotic or pale.      Comments: A little granuloma noted at the top of her suture line.  No signs or symptoms of infection. No overlying erythema or drainage noted. The incision is well approximated.   See photo   Neurological:      Mental Status: She is alert and oriented to person, place, and time. Mental status is at baseline.   Psychiatric:         Behavior: Behavior is cooperative.         Results:  Lab Results   Component Value  Date    VITAMIND25 29.98 (L) 12/03/2024    VITAMIND25 34.42 05/28/2024    VITAMIND25 65.82 12/28/2023      CBC  Diff   Lab Results   Component Value Date/Time    WBC 7.1 12/03/2024 10:18 AM    HGB 10.4 (L) 12/03/2024 10:18 AM    HCT 31.1 (L) 12/03/2024 10:18 AM    PLTCNT 324 12/03/2024 10:18 AM    RBC 3.58 (L) 12/03/2024 10:18 AM    MCV 86.9 12/03/2024 10:18 AM    MCHC 33.3 12/03/2024 10:18 AM    MCH 28.9 12/03/2024 10:18 AM    RDW 17.8 (H) 12/03/2024 10:18 AM    MPV 7.8 (L) 12/03/2024 10:18 AM    Lab Results   Component Value Date/Time    PMNS 71 12/03/2024 10:18 AM    LYMPHOCYTES 19 12/03/2024 10:18 AM    EOSINOPHIL 4 12/03/2024 10:18 AM    MONOCYTES 4 12/03/2024 10:18 AM    BASOPHILS 1 12/03/2024 10:18 AM    BASOPHILS 0.10 12/03/2024 10:18 AM    PMNABS 5.00 12/03/2024 10:18 AM    LYMPHSABS 1.40 12/03/2024 10:18 AM    EOSABS 0.30 12/03/2024 10:18 AM    MONOSABS 0.30 12/03/2024 10:18 AM           COMPREHENSIVE METABOLIC PANEL  Lab Results   Component Value Date    SODIUM 136 12/03/2024    POTASSIUM 4.3 12/03/2024    CHLORIDE 102 12/03/2024    CO2 27 12/03/2024    ANIONGAP 7 12/03/2024    BUN 10 12/03/2024    CREATININE 0.79 12/03/2024    GLUCOSENF 104 12/03/2024    GLUCOSE negative 03/15/2024    CALCIUM 9.7 12/03/2024    ALBUMIN 4.3 12/03/2024    TOTALPROTEIN 7.6 12/03/2024    ALKPHOS 124 (H) 12/03/2024    AST 22 12/03/2024    ALT 15 12/03/2024    GFR 93 12/03/2024                 Lab Results   Component Value Date    CHOLESTEROL 189 12/03/2024    TRIG 139 12/03/2024  HDLCHOL 60 12/03/2024    LDLCHOL 101 (H) 12/03/2024    VLDLCAL 28 12/03/2024    CHOLHDLRATIO 3.2 12/03/2024      Lab Results   Component Value Date    TSH 2.734 12/03/2024     Lab Results   Component Value Date    HA1C <4.0 (L) 12/03/2024            ICD-10-CM    1. Suture granuloma  T81.89XA            Assessment/Plan:  Assessment & Plan  Granuloma   Postoperative wound assessment following lumbar spine surgery  Postoperative wound with knot and  mild soreness likely from  reaction to a dissolving internal stitches. Healing well, no infection or complications. Possible stitch extrusion if it does not dissolving properly.  - Monitor for stitch extrusion and address if occurs.  - otherwise it should resolve over time.   - Advised against lifting and strenuous activities.  - Encouraged initiation of physical therapy, post op.                      Return if symptoms worsen or fail to improve.      Loran Auguste L Jax Abdelrahman APRN, FNP-C    This note was created with assistance from [employer] via capture of conversational audio.  Consent was obtained from the patient prior to recording.      Portions of this note may be dictated using voice recognition software or a dictation service. Variances in spelling and vocabulary are possible and unintentional. Not all errors are caught/corrected. Please notify the dino if any discrepancies are noted or if the meaning of any statement is not clear.        [1]   Allergies  Allergen Reactions    Other Rash     Allergic to the sun

## 2025-01-02 ENCOUNTER — Other Ambulatory Visit (RURAL_HEALTH_CENTER): Payer: Self-pay | Admitting: Family Medicine

## 2025-01-03 MED ORDER — VENLAFAXINE ER 150 MG CAPSULE,EXTENDED RELEASE 24 HR: 150.0000 mg | ORAL_CAPSULE | Freq: Every day | ORAL | 3 refills | Status: AC

## 2025-01-03 MED ORDER — LINACLOTIDE 290 MCG CAPSULE: 290.0000 ug | ORAL_CAPSULE | Freq: Every morning | ORAL | 3 refills | Status: AC

## 2025-01-03 MED ORDER — ROSUVASTATIN 5 MG TABLET: 5.0000 mg | ORAL_TABLET | Freq: Every day | ORAL | 0 refills | Status: AC

## 2025-01-28 ENCOUNTER — Ambulatory Visit (RURAL_HEALTH_CENTER): Payer: Self-pay | Admitting: Family Medicine
